# Patient Record
Sex: Female | Born: 1980 | Race: White | Hispanic: No
Health system: Midwestern US, Academic
[De-identification: ages and names within clinical notes are randomized; demographics above are authoritative.]

---

## 2017-12-21 ENCOUNTER — Encounter: Admit: 2017-12-21 | Discharge: 2017-12-21 | Payer: Private Health Insurance - Indemnity

## 2018-03-17 ENCOUNTER — Encounter: Admit: 2018-03-17 | Discharge: 2018-03-18 | Payer: Private Health Insurance - Indemnity

## 2018-03-17 ENCOUNTER — Encounter: Admit: 2018-03-17 | Discharge: 2018-03-17 | Payer: Private Health Insurance - Indemnity

## 2018-03-17 DIAGNOSIS — E1042 Type 1 diabetes mellitus with diabetic polyneuropathy: Secondary | ICD-10-CM

## 2018-03-17 DIAGNOSIS — I619 Nontraumatic intracerebral hemorrhage, unspecified: ICD-10-CM

## 2018-03-17 DIAGNOSIS — I10 Essential (primary) hypertension: ICD-10-CM

## 2018-03-17 DIAGNOSIS — E119 Type 2 diabetes mellitus without complications: Principal | ICD-10-CM

## 2018-03-17 DIAGNOSIS — R109 Unspecified abdominal pain: ICD-10-CM

## 2018-03-17 MED ORDER — SODIUM CHLORIDE 0.9 % IV SOLP
INTRAVENOUS | 0 refills | Status: DC
Start: 2018-03-17 — End: 2018-03-18
  Administered 2018-03-18 (×2): 1000.000 mL via INTRAVENOUS

## 2018-03-17 MED ORDER — NICARDIPINE IN NACL (ISO-OS) 20 MG/200 ML IV PGBK
5-15 mg/h | INTRAVENOUS | 0 refills | Status: DC
Start: 2018-03-17 — End: 2018-03-18

## 2018-03-17 MED ORDER — NICARDIPINE IN NACL (ISO-OS) 40 MG/200 ML IV PGBK
5-15 mg/h | INTRAVENOUS | 0 refills | Status: DC
Start: 2018-03-17 — End: 2018-03-18
  Administered 2018-03-18: 07:00:00 5 mg/h via INTRAVENOUS
  Administered 2018-03-18: 03:00:00 15 mg/h via INTRAVENOUS

## 2018-03-17 MED ORDER — DOCUSATE SODIUM 100 MG PO CAP
100 mg | Freq: Two times a day (BID) | ORAL | 0 refills | Status: DC
Start: 2018-03-17 — End: 2018-04-02
  Administered 2018-03-18 – 2018-04-02 (×25): 100 mg via ORAL

## 2018-03-17 MED ORDER — MIDAZOLAM 1 MG/ML IJ SOLN
1-2 mg | Freq: Once | INTRAVENOUS | 0 refills | Status: CP
Start: 2018-03-17 — End: ?
  Administered 2018-03-18: 03:00:00 2 mg via INTRAVENOUS

## 2018-03-17 MED ORDER — FENTANYL CITRATE (PF) 50 MCG/ML IJ SOLN
25-50 ug | INTRAVENOUS | 0 refills | Status: DC | PRN
Start: 2018-03-17 — End: 2018-03-27
  Administered 2018-03-18 (×2): 25 ug via INTRAVENOUS
  Administered 2018-03-18 – 2018-03-22 (×4): 50 ug via INTRAVENOUS

## 2018-03-17 MED ORDER — MAGNESIUM HYDROXIDE 2,400 MG/10 ML PO SUSP
10 mL | Freq: Every day | ORAL | 0 refills | Status: DC
Start: 2018-03-17 — End: 2018-04-06
  Administered 2018-03-18 – 2018-04-06 (×14): 10 mL via ORAL

## 2018-03-17 MED ORDER — LIDOCAINE HCL 10 MG/ML (1 %) IJ SOLN
25 mL | Freq: Once | INTRAMUSCULAR | 0 refills | Status: CP
Start: 2018-03-17 — End: ?
  Administered 2018-03-18: 05:00:00 25 mL via INTRAMUSCULAR

## 2018-03-17 MED ORDER — LABETALOL 5 MG/ML IV SYRG
10-20 mg | INTRAVENOUS | 0 refills | Status: DC | PRN
Start: 2018-03-17 — End: 2018-03-19
  Administered 2018-03-18 – 2018-03-19 (×3): 10 mg via INTRAVENOUS
  Administered 2018-03-19 (×4): 20 mg via INTRAVENOUS
  Administered 2018-03-19: 11:00:00 10 mg via INTRAVENOUS

## 2018-03-17 MED ORDER — CEFAZOLIN INJ 1GM IVP
1 g | INTRAVENOUS | 0 refills | Status: CP
Start: 2018-03-17 — End: ?
  Administered 2018-03-18 (×2): 1 g via INTRAVENOUS

## 2018-03-17 MED ORDER — HYDRALAZINE 20 MG/ML IJ SOLN
10 mg | INTRAVENOUS | 0 refills | Status: DC | PRN
Start: 2018-03-17 — End: 2018-03-19
  Administered 2018-03-18 – 2018-03-19 (×3): 10 mg via INTRAVENOUS

## 2018-03-17 MED ORDER — SENNOSIDES-DOCUSATE SODIUM 8.6-50 MG PO TAB
1 | Freq: Two times a day (BID) | ORAL | 0 refills | Status: DC
Start: 2018-03-17 — End: 2018-04-02
  Administered 2018-03-18 – 2018-04-02 (×26): 1 via ORAL

## 2018-03-17 MED ORDER — CEFAZOLIN INJ 1GM IVP
1 g | INTRAVENOUS | 0 refills | Status: DC
Start: 2018-03-17 — End: 2018-03-18
  Administered 2018-03-18: 03:00:00 1 g via INTRAVENOUS

## 2018-03-18 ENCOUNTER — Encounter: Admit: 2018-03-18 | Discharge: 2018-03-18 | Payer: Private Health Insurance - Indemnity

## 2018-03-18 ENCOUNTER — Inpatient Hospital Stay
Admit: 2018-03-18 | Discharge: 2018-04-06 | Disposition: A | Discharge status: Rehabilitation Facility | Payer: Private Health Insurance - Indemnity | Source: Other Acute Inpatient Hospital

## 2018-03-18 ENCOUNTER — Inpatient Hospital Stay: Admit: 2018-03-18 | Discharge: 2018-03-18 | Payer: Private Health Insurance - Indemnity

## 2018-03-18 LAB — POC GLUCOSE
Lab: 107 mg/dL — ABNORMAL HIGH (ref 70–100)
Lab: 121 mg/dL — ABNORMAL HIGH (ref 70–100)
Lab: 128 mg/dL — ABNORMAL HIGH (ref 70–100)
Lab: 129 mg/dL — ABNORMAL HIGH (ref 70–100)
Lab: 133 mg/dL — ABNORMAL HIGH (ref 70–100)
Lab: 135 mg/dL — ABNORMAL HIGH (ref 70–100)
Lab: 168 mg/dL — ABNORMAL HIGH (ref 70–100)
Lab: 178 mg/dL — ABNORMAL HIGH (ref 70–100)
Lab: 185 mg/dL — ABNORMAL HIGH (ref 70–100)
Lab: 193 mg/dL — ABNORMAL HIGH (ref 70–100)
Lab: 231 mg/dL — ABNORMAL HIGH (ref 70–100)
Lab: 289 mg/dL — ABNORMAL HIGH (ref >60)
Lab: 301 mg/dL — ABNORMAL HIGH (ref 70–100)
Lab: 356 mg/dL — ABNORMAL HIGH (ref 70–100)
Lab: 364 mg/dL — ABNORMAL HIGH (ref 70–100)
Lab: 71 mg/dL (ref 70–100)
Lab: 78 mg/dL (ref 70–100)
Lab: 82 mg/dL (ref 70–100)

## 2018-03-18 LAB — BASIC METABOLIC PANEL
Lab: 135 MMOL/L — ABNORMAL LOW (ref 137–147)
Lab: 5.8 MMOL/L — ABNORMAL HIGH (ref 3.5–5.1)
Lab: 135 MMOL/L — ABNORMAL LOW (ref 137–147)
Lab: 137 MMOL/L (ref 137–147)
Lab: 19 MMOL/L — ABNORMAL LOW (ref 21–30)
Lab: 19 mL/min — ABNORMAL LOW (ref >60)
Lab: 2.7 mg/dL — ABNORMAL HIGH (ref 0.4–1.00)
Lab: 289 mg/dL — ABNORMAL HIGH (ref 70–100)
Lab: 3.6 MMOL/L (ref 3.5–5.1)
Lab: 35 mg/dL — ABNORMAL HIGH (ref 7–25)
Lab: 8.5 mg/dL (ref 8.5–10.6)
Lab: 9 (ref 3–12)
Lab: 139 MMOL/L (ref 137–147)
Lab: 3.9 MMOL/L — ABNORMAL HIGH (ref 3.5–5.1)

## 2018-03-18 LAB — CBC AND DIFF
Lab: 0.1 10*3/uL (ref 0–0.20)
Lab: 0.2 10*3/uL (ref 0–0.80)
Lab: 0.9 10*3/uL — ABNORMAL LOW (ref 1.0–4.8)
Lab: 1 % (ref 0–2)
Lab: 10 10*3/uL (ref 4.5–11.0)
Lab: 4.4 M/UL (ref 4.0–5.0)
Lab: 19 K/UL — ABNORMAL HIGH (ref >40)

## 2018-03-18 LAB — SODIUM-URINE RANDOM: Lab: 21 MMOL/L

## 2018-03-18 LAB — LIPID PROFILE
Lab: 125 mg/dL — ABNORMAL HIGH (ref <100)
Lab: 147 mg/dL
Lab: 20 mg/dL
Lab: 221 mg/dL — ABNORMAL HIGH (ref <200)
Lab: 74 mg/dL (ref >40)
Lab: 98 mg/dL (ref <150)

## 2018-03-18 LAB — HEMOGLOBIN A1C: Lab: 8.6 % — ABNORMAL HIGH (ref 4.0–6.0)

## 2018-03-18 LAB — PHOSPHORUS
Lab: 2.8 mg/dL (ref 2.0–4.5)
Lab: 3.8 mg/dL — ABNORMAL LOW (ref >60)

## 2018-03-18 LAB — MAGNESIUM
Lab: 2.1 mg/dL (ref 1.6–2.6)
Lab: 1.9 mg/dL — ABNORMAL LOW (ref 1.6–2.6)

## 2018-03-18 LAB — PHENCYCLIDINES-URINE RANDOM: Lab: NEGATIVE

## 2018-03-18 LAB — BENZODIAZEPINES-URINE RANDOM: Lab: NEGATIVE

## 2018-03-18 LAB — OPIATES-URINE RANDOM: Lab: NEGATIVE

## 2018-03-18 LAB — COCAINE-URINE RANDOM: Lab: NEGATIVE

## 2018-03-18 LAB — URINALYSIS MICROSCOPIC REFLEX TO CULTURE

## 2018-03-18 LAB — BLOOD GASES, ARTERIAL
Lab: 18 MMOL/L — ABNORMAL LOW (ref 21–28)
Lab: 22 mmHg — ABNORMAL LOW (ref 35–45)
Lab: 7 MMOL/L
Lab: 7.4 (ref 7.35–7.45)
Lab: 96 mmHg (ref 80–100)
Lab: 97 % (ref 95–99)

## 2018-03-18 LAB — PREGNANCY TEST-URINE: Lab: NEGATIVE

## 2018-03-18 LAB — PTT (APTT): Lab: 22 s — ABNORMAL LOW (ref 24.0–36.5)

## 2018-03-18 LAB — BARBITURATES-URINE RANDOM: Lab: NEGATIVE

## 2018-03-18 LAB — IONIZED CALCIUM
Lab: 0.9 MMOL/L — ABNORMAL LOW (ref 1.0–1.3)
Lab: 1.1 MMOL/L — ABNORMAL HIGH (ref >60)

## 2018-03-18 LAB — CREATININE-URINE RANDOM: Lab: 114 mg/dL

## 2018-03-18 LAB — PROTIME INR (PT): Lab: 0.9 % — ABNORMAL LOW (ref >60)

## 2018-03-18 LAB — CANNABINOIDS-URINE RANDOM: Lab: NEGATIVE

## 2018-03-18 LAB — AMPHETAMINES-URINE RANDOM: Lab: NEGATIVE

## 2018-03-18 LAB — URINALYSIS DIPSTICK REFLEX TO CULTURE

## 2018-03-18 MED ORDER — ELECTROLYTE-A IV SOLP
500 mL | INTRAVENOUS | 0 refills | Status: DC
Start: 2018-03-18 — End: 2018-03-20
  Administered 2018-03-19 – 2018-03-20 (×5): 500 mL via INTRAVENOUS

## 2018-03-18 MED ORDER — ONDANSETRON HCL (PF) 4 MG/2 ML IJ SOLN
4 mg | INTRAVENOUS | 0 refills | Status: DC | PRN
Start: 2018-03-18 — End: 2018-04-06
  Administered 2018-03-18 – 2018-03-31 (×6): 4 mg via INTRAVENOUS

## 2018-03-18 MED ORDER — INSULIN ASPART 100 UNIT/ML SC FLEXPEN
0-24 [IU] | Freq: Every day | SUBCUTANEOUS | 0 refills | Status: DC
Start: 2018-03-18 — End: 2018-03-18

## 2018-03-18 MED ORDER — INSULIN 100UNITS NS 100ML
1-32 [IU]/h | INTRAVENOUS | 0 refills | Status: DC
Start: 2018-03-18 — End: 2018-03-19
  Administered 2018-03-18 (×2): 3 [IU]/h via INTRAVENOUS

## 2018-03-18 MED ORDER — INSULIN GLARGINE 100 UNIT/ML (3 ML) SC INJ PEN
20 [IU] | Freq: Every day | SUBCUTANEOUS | 0 refills | Status: DC
Start: 2018-03-18 — End: 2018-03-19
  Administered 2018-03-18: 20:00:00 20 [IU] via SUBCUTANEOUS

## 2018-03-18 MED ORDER — BACLOFEN 10 MG PO TAB
10 mg | Freq: Three times a day (TID) | ORAL | 0 refills | Status: DC
Start: 2018-03-18 — End: 2018-03-18
  Administered 2018-03-18: 15:00:00 10 mg via ORAL

## 2018-03-18 MED ORDER — METOPROLOL TARTRATE 5 MG/5 ML IV SOLN
2.5 mg | Freq: Once | INTRAVENOUS | 0 refills | Status: CP
Start: 2018-03-18 — End: ?
  Administered 2018-03-18: 15:00:00 2.5 mg via INTRAVENOUS

## 2018-03-18 MED ORDER — ACETAMINOPHEN 160 MG/5 ML PO SOLN
650 mg | ORAL | 0 refills | Status: DC | PRN
Start: 2018-03-18 — End: 2018-03-19
  Administered 2018-03-18 – 2018-03-19 (×2): 650 mg via ORAL

## 2018-03-18 MED ORDER — INSULIN ASPART 100 UNIT/ML SC FLEXPEN
0-12 [IU] | Freq: Every day | SUBCUTANEOUS | 0 refills | Status: DC
Start: 2018-03-18 — End: 2018-03-19

## 2018-03-18 MED ORDER — SODIUM POLYSTYRENE SULFONATE 30 GRAM/120 ML RE ENEM
30 g | Freq: Once | RECTAL | 0 refills | Status: DC
Start: 2018-03-18 — End: 2018-03-18

## 2018-03-18 MED ORDER — GABAPENTIN 300 MG PO CAP
300 mg | ORAL | 0 refills | Status: DC
Start: 2018-03-18 — End: 2018-03-18
  Administered 2018-03-18: 15:00:00 300 mg via ORAL

## 2018-03-19 ENCOUNTER — Encounter: Admit: 2018-03-19 | Discharge: 2018-03-19 | Payer: Private Health Insurance - Indemnity

## 2018-03-19 LAB — POC GLUCOSE
Lab: 182 mg/dL — ABNORMAL HIGH (ref 70–100)
Lab: 139 mg/dL — ABNORMAL HIGH (ref 70–100)
Lab: 93 mg/dL (ref 70–100)
Lab: 125 mg/dL — ABNORMAL HIGH (ref 70–100)
Lab: 115 mg/dL — ABNORMAL HIGH (ref 70–100)
Lab: 146 mg/dL — ABNORMAL HIGH (ref 70–100)
Lab: 191 mg/dL — ABNORMAL HIGH (ref 70–100)
Lab: 209 mg/dL — ABNORMAL HIGH (ref 70–100)
Lab: 277 mg/dL — ABNORMAL HIGH (ref 70–100)

## 2018-03-19 LAB — IONIZED CALCIUM: Lab: 1.1 MMOL/L — ABNORMAL LOW (ref 1.0–1.3)

## 2018-03-19 LAB — CBC AND DIFF: Lab: 12 K/UL — ABNORMAL HIGH (ref 4.5–11.0)

## 2018-03-19 LAB — CULTURE-URINE W/SENSITIVITY

## 2018-03-19 LAB — BASIC METABOLIC PANEL: Lab: 140 MMOL/L — ABNORMAL LOW (ref >60)

## 2018-03-19 LAB — PHOSPHORUS: Lab: 4.8 mg/dL — ABNORMAL HIGH (ref 2.0–4.5)

## 2018-03-19 LAB — MAGNESIUM: Lab: 2.2 mg/dL — ABNORMAL HIGH (ref 1.6–2.6)

## 2018-03-19 MED ORDER — INSULIN ASPART 100 UNIT/ML SC FLEXPEN
1-25 [IU] | Freq: Three times a day (TID) | SUBCUTANEOUS | 0 refills | Status: DC
Start: 2018-03-19 — End: 2018-03-21

## 2018-03-19 MED ORDER — LABETALOL 5 MG/ML IV SYRG
10-20 mg | INTRAVENOUS | 0 refills | Status: DC | PRN
Start: 2018-03-19 — End: 2018-03-25
  Administered 2018-03-19 – 2018-03-20 (×7): 20 mg via INTRAVENOUS
  Administered 2018-03-21: 09:00:00 10 mg via INTRAVENOUS
  Administered 2018-03-21: 23:00:00 20 mg via INTRAVENOUS
  Administered 2018-03-21 (×3): 10 mg via INTRAVENOUS
  Administered 2018-03-21: 03:00:00 20 mg via INTRAVENOUS
  Administered 2018-03-21 (×2): 10 mg via INTRAVENOUS
  Administered 2018-03-22: 11:00:00 20 mg via INTRAVENOUS
  Administered 2018-03-22: 05:00:00 10 mg via INTRAVENOUS
  Administered 2018-03-22 (×13): 20 mg via INTRAVENOUS
  Administered 2018-03-22: 05:00:00 10 mg via INTRAVENOUS
  Administered 2018-03-22 – 2018-03-23 (×24): 20 mg via INTRAVENOUS
  Administered 2018-03-24 – 2018-03-25 (×4): 10 mg via INTRAVENOUS

## 2018-03-19 MED ORDER — HYDRALAZINE 20 MG/ML IJ SOLN
10-20 mg | INTRAVENOUS | 0 refills | Status: DC | PRN
Start: 2018-03-19 — End: 2018-03-19
  Administered 2018-03-19: 18:00:00 10 mg via INTRAVENOUS

## 2018-03-19 MED ORDER — GABAPENTIN 250 MG/5 ML PO SOLN
300 mg | ORAL | 0 refills | Status: DC
Start: 2018-03-19 — End: 2018-03-20
  Administered 2018-03-19 – 2018-03-20 (×4): 300 mg via ORAL

## 2018-03-19 MED ORDER — HYDRALAZINE 20 MG/ML IJ SOLN
10-20 mg | INTRAVENOUS | 0 refills | Status: DC | PRN
Start: 2018-03-19 — End: 2018-04-01
  Administered 2018-03-19: 22:00:00 10 mg via INTRAVENOUS
  Administered 2018-03-20 (×2): 20 mg via INTRAVENOUS
  Administered 2018-03-21 (×2): 10 mg via INTRAVENOUS
  Administered 2018-03-21 (×2): 20 mg via INTRAVENOUS
  Administered 2018-03-21 – 2018-03-22 (×3): 10 mg via INTRAVENOUS
  Administered 2018-03-22: 22:00:00 20 mg via INTRAVENOUS
  Administered 2018-03-22: 08:00:00 10 mg via INTRAVENOUS
  Administered 2018-03-22: 15:00:00 20 mg via INTRAVENOUS
  Administered 2018-03-22: 04:00:00 10 mg via INTRAVENOUS
  Administered 2018-03-23: 06:00:00 20 mg via INTRAVENOUS
  Administered 2018-03-24: 02:00:00 10 mg via INTRAVENOUS
  Administered 2018-03-25: 03:00:00 20 mg via INTRAVENOUS
  Administered 2018-03-28: 08:00:00 15 mg via INTRAVENOUS
  Administered 2018-03-31 (×2): 10 mg via INTRAVENOUS

## 2018-03-19 MED ORDER — INSULIN GLARGINE 100 UNIT/ML (3 ML) SC INJ PEN
18 [IU] | Freq: Every day | SUBCUTANEOUS | 0 refills | Status: DC
Start: 2018-03-19 — End: 2018-03-20

## 2018-03-19 MED ORDER — ACETAMINOPHEN 325 MG PO TAB
650 mg | ORAL | 0 refills | Status: DC | PRN
Start: 2018-03-19 — End: 2018-04-04
  Administered 2018-03-19 – 2018-04-03 (×21): 650 mg via ORAL

## 2018-03-19 MED ORDER — AMLODIPINE 5 MG PO TAB
5 mg | Freq: Every day | ORAL | 0 refills | Status: DC
Start: 2018-03-19 — End: 2018-03-20
  Administered 2018-03-19: 13:00:00 5 mg via ORAL

## 2018-03-19 MED ORDER — INSULIN ASPART 100 UNIT/ML SC FLEXPEN
0-6 [IU] | Freq: Every day | SUBCUTANEOUS | 0 refills | Status: DC
Start: 2018-03-19 — End: 2018-03-21

## 2018-03-20 LAB — POC GLUCOSE
Lab: 273 mg/dL — ABNORMAL HIGH (ref 70–100)
Lab: 265 mg/dL — ABNORMAL HIGH (ref 70–100)
Lab: 233 mg/dL — ABNORMAL HIGH (ref 70–100)
Lab: 328 mg/dL — ABNORMAL HIGH (ref 70–100)
Lab: 307 mg/dL — ABNORMAL HIGH (ref 70–100)

## 2018-03-20 LAB — PHOSPHORUS: Lab: 4.3 mg/dL — ABNORMAL HIGH (ref >60)

## 2018-03-20 LAB — MAGNESIUM: Lab: 2.6 mg/dL — ABNORMAL HIGH (ref 1.6–2.6)

## 2018-03-20 LAB — IONIZED CALCIUM: Lab: 1.1 MMOL/L — ABNORMAL HIGH (ref >60)

## 2018-03-20 LAB — BASIC METABOLIC PANEL: Lab: 142 MMOL/L — ABNORMAL LOW (ref 137–147)

## 2018-03-20 LAB — CBC AND DIFF: Lab: 9.8 K/UL — ABNORMAL LOW (ref 4.5–11.0)

## 2018-03-20 MED ORDER — INSULIN GLARGINE 100 UNIT/ML (3 ML) SC INJ PEN
20 [IU] | Freq: Every day | SUBCUTANEOUS | 0 refills | Status: DC
Start: 2018-03-20 — End: 2018-03-21

## 2018-03-20 MED ORDER — BACLOFEN 10 MG PO TAB
5 mg | Freq: Three times a day (TID) | ORAL | 0 refills | Status: DC
Start: 2018-03-20 — End: 2018-03-21
  Administered 2018-03-21: 13:00:00 5 mg via ORAL

## 2018-03-20 MED ORDER — METOPROLOL TARTRATE 25 MG PO TAB
12.5 mg | Freq: Two times a day (BID) | ORAL | 0 refills | Status: DC
Start: 2018-03-20 — End: 2018-03-21
  Administered 2018-03-20 – 2018-03-21 (×3): 12.5 mg via ORAL

## 2018-03-20 MED ORDER — AMLODIPINE 10 MG PO TAB
10 mg | Freq: Every day | ORAL | 0 refills | Status: DC
Start: 2018-03-20 — End: 2018-04-06
  Administered 2018-03-20 – 2018-04-06 (×18): 10 mg via ORAL

## 2018-03-20 MED ORDER — BACLOFEN(#) 10MG/ML PO SUSP
5 mg | Freq: Three times a day (TID) | ORAL | 0 refills | Status: DC
Start: 2018-03-20 — End: 2018-03-20

## 2018-03-20 MED ORDER — HEPARIN, PORCINE (PF) 5,000 UNIT/0.5 ML IJ SYRG
5000 [IU] | SUBCUTANEOUS | 0 refills | Status: CP
Start: 2018-03-20 — End: ?
  Administered 2018-03-20 – 2018-03-23 (×8): 5000 [IU] via SUBCUTANEOUS

## 2018-03-21 LAB — POC GLUCOSE
Lab: 282 mg/dL — ABNORMAL HIGH (ref 70–100)
Lab: 197 mg/dL — ABNORMAL HIGH (ref 70–100)
Lab: 209 mg/dL — ABNORMAL HIGH (ref 70–100)
Lab: 183 mg/dL — ABNORMAL HIGH (ref 70–100)
Lab: 390 mg/dL — ABNORMAL HIGH (ref 70–100)
Lab: 264 mg/dL — ABNORMAL HIGH (ref 70–100)
Lab: 266 mg/dL — ABNORMAL HIGH (ref 70–100)
Lab: 261 mg/dL — ABNORMAL HIGH (ref 70–100)

## 2018-03-21 LAB — CBC AND DIFF: Lab: 8.4 K/UL — ABNORMAL LOW (ref 4.5–11.0)

## 2018-03-21 LAB — BASIC METABOLIC PANEL: Lab: 138 MMOL/L — ABNORMAL LOW (ref 137–147)

## 2018-03-21 LAB — MAGNESIUM: Lab: 2.4 mg/dL — ABNORMAL LOW (ref 1.6–2.6)

## 2018-03-21 LAB — PHOSPHORUS: Lab: 4.4 mg/dL — ABNORMAL LOW (ref 2.0–4.5)

## 2018-03-21 LAB — IONIZED CALCIUM: Lab: 1.1 MMOL/L — ABNORMAL HIGH (ref 1.0–1.3)

## 2018-03-21 MED ORDER — ALBUMIN, HUMAN 25 % IV SOLP
25 g | Freq: Two times a day (BID) | INTRAVENOUS | 0 refills | Status: CP
Start: 2018-03-21 — End: ?
  Administered 2018-03-21 – 2018-03-23 (×4): 25 g via INTRAVENOUS

## 2018-03-21 MED ORDER — LIDOCAINE 5 % TP PTMD
1 | Freq: Every day | TOPICAL | 0 refills | Status: DC
Start: 2018-03-21 — End: 2018-04-06
  Administered 2018-03-21 – 2018-04-04 (×12): 1 via TOPICAL

## 2018-03-21 MED ORDER — INSULIN ASPART 100 UNIT/ML SC FLEXPEN
1-25 [IU] | Freq: Three times a day (TID) | SUBCUTANEOUS | 0 refills | Status: DC
Start: 2018-03-21 — End: 2018-03-22

## 2018-03-21 MED ORDER — ALBUMIN, HUMAN 5 % IV SOLP
250 mL | Freq: Once | INTRAVENOUS | 0 refills | Status: CP
Start: 2018-03-21 — End: ?
  Administered 2018-03-21: 16:00:00 250 mL via INTRAVENOUS

## 2018-03-21 MED ORDER — METHOCARBAMOL 750 MG PO TAB
750 mg | Freq: Two times a day (BID) | ORAL | 0 refills | Status: DC | PRN
Start: 2018-03-21 — End: 2018-04-02
  Administered 2018-03-21 – 2018-03-28 (×4): 750 mg via ORAL

## 2018-03-21 MED ORDER — GABAPENTIN 300 MG PO CAP
300 mg | ORAL | 0 refills | Status: DC | PRN
Start: 2018-03-21 — End: 2018-04-02
  Administered 2018-03-21 – 2018-03-23 (×3): 300 mg via ORAL

## 2018-03-21 MED ORDER — INSULIN ASPART 100 UNIT/ML SC FLEXPEN
0-12 [IU] | Freq: Before meals | SUBCUTANEOUS | 0 refills | Status: DC
Start: 2018-03-21 — End: 2018-03-22

## 2018-03-21 MED ORDER — INSULIN GLARGINE 100 UNIT/ML (3 ML) SC INJ PEN
24 [IU] | Freq: Every day | SUBCUTANEOUS | 0 refills | Status: DC
Start: 2018-03-21 — End: 2018-03-22

## 2018-03-21 MED ORDER — CARVEDILOL 6.25 MG PO TAB
6.25 mg | Freq: Two times a day (BID) | ORAL | 0 refills | Status: DC
Start: 2018-03-21 — End: 2018-03-22
  Administered 2018-03-21 – 2018-03-22 (×2): 6.25 mg via ORAL

## 2018-03-21 MED ORDER — PATCH DOCUMENTATION - LIDOCAINE 5%
Freq: Two times a day (BID) | TRANSDERMAL | 0 refills | Status: DC
Start: 2018-03-21 — End: 2018-04-06

## 2018-03-21 MED ORDER — OXYCODONE 5 MG PO TAB
5 mg | Freq: Once | ORAL | 0 refills | Status: CP
Start: 2018-03-21 — End: ?
  Administered 2018-03-22: 02:00:00 5 mg via ORAL

## 2018-03-22 ENCOUNTER — Encounter: Admit: 2018-03-22 | Discharge: 2018-03-22 | Payer: Private Health Insurance - Indemnity

## 2018-03-22 LAB — CBC AND DIFF: Lab: 9.2 K/UL — ABNORMAL LOW (ref 4.5–11.0)

## 2018-03-22 LAB — POC GLUCOSE
Lab: 323 mg/dL — ABNORMAL HIGH (ref 70–100)
Lab: 518 mg/dL — ABNORMAL HIGH (ref 70–100)
Lab: 395 mg/dL — ABNORMAL HIGH (ref 70–100)
Lab: 368 mg/dL — ABNORMAL HIGH (ref 70–100)
Lab: 301 mg/dL — ABNORMAL HIGH (ref 70–100)
Lab: 292 mg/dL — ABNORMAL HIGH (ref 70–100)
Lab: 276 mg/dL — ABNORMAL HIGH (ref 70–100)
Lab: 247 mg/dL — ABNORMAL HIGH (ref 70–100)
Lab: 209 mg/dL — ABNORMAL HIGH (ref 70–100)
Lab: 181 mg/dL — ABNORMAL HIGH (ref 70–100)
Lab: 167 mg/dL — ABNORMAL HIGH (ref 70–100)
Lab: 144 mg/dL — ABNORMAL HIGH (ref 70–100)
Lab: 196 mg/dL — ABNORMAL HIGH (ref 70–100)

## 2018-03-22 LAB — IONIZED CALCIUM: Lab: 1.1 MMOL/L — CL (ref >60)

## 2018-03-22 LAB — MAGNESIUM: Lab: 2.4 mg/dL — ABNORMAL LOW (ref 1.6–2.6)

## 2018-03-22 LAB — BASIC METABOLIC PANEL: Lab: 134 MMOL/L — ABNORMAL LOW (ref 137–147)

## 2018-03-22 LAB — PHOSPHORUS: Lab: 4.7 mg/dL — ABNORMAL HIGH (ref >60)

## 2018-03-22 MED ORDER — INSULIN 100UNITS NS 100ML
1-32 [IU]/h | INTRAVENOUS | 0 refills | Status: AC
Start: 2018-03-22 — End: ?
  Administered 2018-03-22 (×2): 4 [IU]/h via INTRAVENOUS
  Administered 2018-03-23 (×2): 2 [IU]/h via INTRAVENOUS

## 2018-03-22 MED ORDER — INSULIN ASPART 100 UNIT/ML SC FLEXPEN
1-25 [IU] | Freq: Three times a day (TID) | SUBCUTANEOUS | 0 refills | Status: DC
Start: 2018-03-22 — End: 2018-03-31

## 2018-03-22 MED ORDER — HYDRALAZINE 20 MG/ML IJ SOLN
10 mg | INTRAVENOUS | 0 refills | Status: DC
Start: 2018-03-22 — End: 2018-03-23
  Administered 2018-03-22 – 2018-03-23 (×3): 10 mg via INTRAVENOUS

## 2018-03-22 MED ORDER — ELECTROLYTE-A IV SOLP
500 mL | Freq: Once | INTRAVENOUS | 0 refills | Status: CP
Start: 2018-03-22 — End: ?
  Administered 2018-03-22: 17:00:00 500 mL via INTRAVENOUS

## 2018-03-22 MED ORDER — CARVEDILOL 12.5 MG PO TAB
12.5 mg | Freq: Two times a day (BID) | ORAL | 0 refills | Status: DC
Start: 2018-03-22 — End: 2018-03-23
  Administered 2018-03-22 – 2018-03-23 (×3): 12.5 mg via ORAL

## 2018-03-23 LAB — POC GLUCOSE
Lab: 100 mg/dL (ref 70–100)
Lab: 101 mg/dL — ABNORMAL HIGH (ref 70–100)
Lab: 105 mg/dL — ABNORMAL HIGH (ref 70–100)
Lab: 111 mg/dL — ABNORMAL HIGH (ref 70–100)
Lab: 111 mg/dL — ABNORMAL HIGH (ref 70–100)
Lab: 113 mg/dL — ABNORMAL HIGH (ref 70–100)
Lab: 114 mg/dL — ABNORMAL HIGH (ref 70–100)
Lab: 121 mg/dL — ABNORMAL HIGH (ref 70–100)
Lab: 126 mg/dL — ABNORMAL HIGH (ref 70–100)
Lab: 128 mg/dL — ABNORMAL HIGH (ref 70–100)
Lab: 135 mg/dL — ABNORMAL HIGH (ref 70–100)
Lab: 140 mg/dL — ABNORMAL HIGH (ref 70–100)
Lab: 149 mg/dL — ABNORMAL HIGH (ref 70–100)
Lab: 155 mg/dL — ABNORMAL HIGH (ref 70–100)
Lab: 160 mg/dL — ABNORMAL HIGH (ref 70–100)
Lab: 164 mg/dL — ABNORMAL HIGH (ref 70–100)
Lab: 168 mg/dL — ABNORMAL HIGH (ref 70–100)
Lab: 177 mg/dL — ABNORMAL HIGH (ref 70–100)
Lab: 209 mg/dL — ABNORMAL HIGH (ref 70–100)
Lab: 228 mg/dL — ABNORMAL HIGH (ref 70–100)
Lab: 343 mg/dL — ABNORMAL HIGH (ref 70–100)
Lab: 357 mg/dL — ABNORMAL HIGH (ref 70–100)
Lab: 83 mg/dL (ref 70–100)
Lab: 94 mg/dL (ref 70–100)

## 2018-03-23 LAB — PHOSPHORUS: Lab: 5.2 mg/dL — ABNORMAL HIGH (ref 2.0–4.5)

## 2018-03-23 LAB — CBC
Lab: 13 % (ref 11–15)
Lab: 241 K/UL — ABNORMAL LOW (ref >60)
Lab: 28 pg — ABNORMAL HIGH (ref 26–34)
Lab: 32 g/dL — ABNORMAL HIGH (ref 32.0–36.0)
Lab: 8.2 10*3/uL (ref 4.5–11.0)
Lab: 86 FL — ABNORMAL HIGH (ref 80–100)

## 2018-03-23 LAB — URINALYSIS, MICROSCOPIC

## 2018-03-23 LAB — URINALYSIS DIPSTICK

## 2018-03-23 LAB — IONIZED CALCIUM: Lab: 1.1 MMOL/L — ABNORMAL LOW (ref 1.0–1.3)

## 2018-03-23 LAB — MAGNESIUM: Lab: 2.6 mg/dL — ABNORMAL LOW (ref 1.6–2.6)

## 2018-03-23 LAB — BASIC METABOLIC PANEL: Lab: 138 MMOL/L (ref 137–147)

## 2018-03-23 MED ORDER — CARVEDILOL 25 MG PO TAB
25 mg | Freq: Two times a day (BID) | ORAL | 0 refills | Status: DC
Start: 2018-03-23 — End: 2018-04-06
  Administered 2018-03-24 – 2018-04-06 (×27): 25 mg via ORAL

## 2018-03-23 MED ORDER — HYDRALAZINE 20 MG/ML IJ SOLN
10 mg | INTRAVENOUS | 0 refills | Status: DC
Start: 2018-03-23 — End: 2018-03-26
  Administered 2018-03-23 – 2018-03-26 (×10): 10 mg via INTRAVENOUS

## 2018-03-23 MED ORDER — OXYCODONE 5 MG PO TAB
5 mg | Freq: Once | ORAL | 0 refills | Status: CP
Start: 2018-03-23 — End: ?
  Administered 2018-03-23: 15:00:00 5 mg via ORAL

## 2018-03-23 MED ORDER — HYDRALAZINE 20 MG/ML IJ SOLN
20 mg | INTRAVENOUS | 0 refills | Status: DC
Start: 2018-03-23 — End: 2018-03-23

## 2018-03-23 MED ORDER — INSULIN ASPART 100 UNIT/ML SC FLEXPEN
0-12 [IU] | Freq: Every day | SUBCUTANEOUS | 0 refills | Status: DC
Start: 2018-03-23 — End: 2018-03-26

## 2018-03-23 MED ORDER — INSULIN GLARGINE 100 UNIT/ML (3 ML) SC INJ PEN
28 [IU] | Freq: Every day | SUBCUTANEOUS | 0 refills | Status: DC
Start: 2018-03-23 — End: 2018-03-24

## 2018-03-23 MED ORDER — INSULIN ASPART 100 UNIT/ML SC FLEXPEN
0-12 [IU] | Freq: Before meals | SUBCUTANEOUS | 0 refills | Status: DC
Start: 2018-03-23 — End: 2018-03-24

## 2018-03-23 MED ORDER — INSULIN GLARGINE 100 UNIT/ML (3 ML) SC INJ PEN
24 [IU] | Freq: Every day | SUBCUTANEOUS | 0 refills | Status: DC
Start: 2018-03-23 — End: 2018-03-24

## 2018-03-23 MED ORDER — CARVEDILOL 12.5 MG PO TAB
12.5 mg | Freq: Once | ORAL | 0 refills | Status: CP
Start: 2018-03-23 — End: ?
  Administered 2018-03-23: 15:00:00 12.5 mg via ORAL

## 2018-03-24 LAB — POC GLUCOSE
Lab: 105 mg/dL — ABNORMAL HIGH (ref 70–100)
Lab: 196 mg/dL — ABNORMAL HIGH (ref 70–100)
Lab: 271 mg/dL — ABNORMAL HIGH (ref 70–100)
Lab: 296 mg/dL — ABNORMAL HIGH (ref 70–100)
Lab: 322 mg/dL — ABNORMAL HIGH (ref 70–100)
Lab: 323 mg/dL — ABNORMAL HIGH (ref 70–100)
Lab: 69 mg/dL — ABNORMAL LOW (ref 70–100)

## 2018-03-24 LAB — MAGNESIUM: Lab: 2.5 mg/dL — ABNORMAL LOW (ref 1.6–2.6)

## 2018-03-24 LAB — PHOSPHORUS: Lab: 4.5 mg/dL — ABNORMAL HIGH (ref >60)

## 2018-03-24 LAB — BASIC METABOLIC PANEL: Lab: 135 MMOL/L — ABNORMAL LOW (ref 137–147)

## 2018-03-24 LAB — CBC: Lab: 8.3 10*3/uL — ABNORMAL LOW (ref >60)

## 2018-03-24 LAB — PARATHYROID HORMONE: Lab: 76 pg/mL — ABNORMAL HIGH (ref 10–65)

## 2018-03-24 LAB — CULTURE-BLOOD W/SENSITIVITY

## 2018-03-24 LAB — IONIZED CALCIUM: Lab: 1.1 MMOL/L — ABNORMAL LOW (ref >60)

## 2018-03-24 MED ORDER — OLANZAPINE 5 MG PO TBDI
5 mg | Freq: Once | ORAL | 0 refills | Status: DC
Start: 2018-03-24 — End: 2018-03-24

## 2018-03-24 MED ORDER — HALOPERIDOL LACTATE 5 MG/ML IJ SOLN
2 mg | Freq: Once | INTRAVENOUS | 0 refills | Status: CP
Start: 2018-03-24 — End: ?
  Administered 2018-03-24: 06:00:00 2 mg via INTRAVENOUS

## 2018-03-24 MED ORDER — HEPARIN, PORCINE (PF) 5,000 UNIT/0.5 ML IJ SYRG
5000 [IU] | SUBCUTANEOUS | 0 refills | Status: DC
Start: 2018-03-24 — End: 2018-03-27
  Administered 2018-03-25 – 2018-03-27 (×6): 5000 [IU] via SUBCUTANEOUS

## 2018-03-24 MED ORDER — INSULIN GLARGINE 100 UNIT/ML (3 ML) SC INJ PEN
30 [IU] | Freq: Every day | SUBCUTANEOUS | 0 refills | Status: DC
Start: 2018-03-24 — End: 2018-03-26

## 2018-03-24 MED ORDER — QUETIAPINE 25 MG PO TAB
100 mg | Freq: Every evening | ORAL | 0 refills | Status: DC
Start: 2018-03-24 — End: 2018-03-27
  Administered 2018-03-25 – 2018-03-27 (×2): 100 mg via ORAL

## 2018-03-24 MED ORDER — CEFTRIAXONE INJ 1GM IVP
1 g | INTRAVENOUS | 0 refills | Status: CP
Start: 2018-03-24 — End: ?
  Administered 2018-03-24 – 2018-03-26 (×3): 1 g via INTRAVENOUS

## 2018-03-24 MED ORDER — OLANZAPINE 2.5 MG PO TAB
5 mg | Freq: Once | ORAL | 0 refills | Status: DC
Start: 2018-03-24 — End: 2018-03-24

## 2018-03-25 LAB — POC GLUCOSE
Lab: 119 mg/dL — ABNORMAL HIGH (ref 70–100)
Lab: 104 mg/dL — ABNORMAL HIGH (ref 70–100)
Lab: 235 mg/dL — ABNORMAL HIGH (ref 70–100)
Lab: 201 mg/dL — ABNORMAL HIGH (ref 70–100)
Lab: 45 mg/dL — CL (ref 70–100)
Lab: 111 mg/dL — ABNORMAL HIGH (ref 70–100)
Lab: 57 mg/dL — ABNORMAL LOW (ref 70–100)

## 2018-03-25 LAB — CBC: Lab: 10 K/UL — ABNORMAL HIGH (ref >60)

## 2018-03-25 LAB — PROTEIN/CR RATIO,UR RAN
Lab: 427 mg/dL
Lab: 5.3
Lab: 80 mg/dL

## 2018-03-25 LAB — IONIZED CALCIUM: Lab: 1.2 MMOL/L — ABNORMAL LOW (ref >60)

## 2018-03-25 LAB — PHOSPHORUS: Lab: 4.1 mg/dL — ABNORMAL LOW (ref 2.0–4.5)

## 2018-03-25 LAB — MAGNESIUM: Lab: 2.3 mg/dL — ABNORMAL LOW (ref 1.6–2.6)

## 2018-03-25 LAB — BASIC METABOLIC PANEL: Lab: 138 MMOL/L — ABNORMAL HIGH (ref >60)

## 2018-03-25 MED ORDER — LABETALOL 5 MG/ML IV SYRG
10-20 mg | INTRAVENOUS | 0 refills | Status: DC | PRN
Start: 2018-03-25 — End: 2018-03-31
  Administered 2018-03-25 (×3): 20 mg via INTRAVENOUS
  Administered 2018-03-31: 10:00:00 10 mg via INTRAVENOUS

## 2018-03-25 MED ORDER — INSULIN ASPART 100 UNIT/ML SC FLEXPEN
0-6 [IU] | Freq: Every day | SUBCUTANEOUS | 0 refills | Status: DC
Start: 2018-03-25 — End: 2018-03-26

## 2018-03-25 MED ORDER — NICARDIPINE IN NACL (ISO-OS) 20 MG/200 ML IV PGBK
5-15 mg/h | INTRAVENOUS | 0 refills | Status: DC
Start: 2018-03-25 — End: 2018-03-29
  Administered 2018-03-25 (×4): 5 mg/h via INTRAVENOUS
  Administered 2018-03-26: 15:00:00 10 mg/h via INTRAVENOUS
  Administered 2018-03-26: 04:00:00 5 mg/h via INTRAVENOUS
  Administered 2018-03-26: 12:00:00 10 mg/h via INTRAVENOUS
  Administered 2018-03-26: 08:00:00 5 mg/h via INTRAVENOUS

## 2018-03-25 MED ORDER — INSULIN ASPART 100 UNIT/ML SC FLEXPEN
0-6 [IU] | Freq: Before meals | SUBCUTANEOUS | 0 refills | Status: DC
Start: 2018-03-25 — End: 2018-03-26

## 2018-03-25 MED ORDER — CLONIDINE HCL 0.1 MG PO TAB
.1 mg | Freq: Three times a day (TID) | ORAL | 0 refills | Status: DC
Start: 2018-03-25 — End: 2018-03-26
  Administered 2018-03-25 – 2018-03-26 (×3): 0.1 mg via ORAL

## 2018-03-26 LAB — CBC
Lab: 11 10*3/uL — ABNORMAL HIGH (ref 4.5–11.0)
Lab: 28 pg — ABNORMAL HIGH (ref 26–34)
Lab: 30 % — ABNORMAL LOW (ref 36–45)
Lab: 32 g/dL — ABNORMAL HIGH (ref 32.0–36.0)
Lab: 87 FL — ABNORMAL HIGH (ref 80–100)

## 2018-03-26 LAB — POC GLUCOSE
Lab: 311 mg/dL — ABNORMAL HIGH (ref 70–100)
Lab: 406 mg/dL — ABNORMAL HIGH (ref 70–100)
Lab: 401 mg/dL — ABNORMAL HIGH (ref 70–100)
Lab: 337 mg/dL — ABNORMAL HIGH (ref 70–100)
Lab: 314 mg/dL — ABNORMAL HIGH (ref 70–100)
Lab: 421 mg/dL — ABNORMAL HIGH (ref 70–100)
Lab: 358 mg/dL — ABNORMAL HIGH (ref 70–100)
Lab: 309 mg/dL — ABNORMAL HIGH (ref 70–100)
Lab: 252 mg/dL — ABNORMAL HIGH (ref 70–100)

## 2018-03-26 LAB — MAGNESIUM: Lab: 2.3 mg/dL — ABNORMAL LOW (ref 1.6–2.6)

## 2018-03-26 LAB — PHOSPHORUS: Lab: 3.3 mg/dL — ABNORMAL LOW (ref 2.0–4.5)

## 2018-03-26 LAB — BASIC METABOLIC PANEL: Lab: 135 MMOL/L — ABNORMAL LOW (ref 137–147)

## 2018-03-26 MED ORDER — INSULIN ASPART 100 UNIT/ML SC FLEXPEN
0-12 [IU] | Freq: Every day | SUBCUTANEOUS | 0 refills | Status: DC
Start: 2018-03-26 — End: 2018-04-06
  Administered 2018-03-30: 01:00:00 6 [IU] via SUBCUTANEOUS

## 2018-03-26 MED ORDER — ACETAMINOPHEN 325 MG PO TAB
650 mg | Freq: Once | ORAL | 0 refills | Status: CP
Start: 2018-03-26 — End: ?
  Administered 2018-03-26: 15:00:00 650 mg via ORAL

## 2018-03-26 MED ORDER — INSULIN GLARGINE 100 UNIT/ML (3 ML) SC INJ PEN
32 [IU] | Freq: Every day | SUBCUTANEOUS | 0 refills | Status: DC
Start: 2018-03-26 — End: 2018-03-26

## 2018-03-26 MED ORDER — FUROSEMIDE 10 MG/ML IJ SOLN
40 mg | Freq: Once | INTRAVENOUS | 0 refills | Status: CP
Start: 2018-03-26 — End: ?
  Administered 2018-03-26: 21:00:00 40 mg via INTRAVENOUS

## 2018-03-26 MED ORDER — CLONIDINE HCL 0.2 MG PO TAB
.2 mg | Freq: Three times a day (TID) | ORAL | 0 refills | Status: DC
Start: 2018-03-26 — End: 2018-03-26

## 2018-03-26 MED ORDER — CLONIDINE HCL 0.1 MG PO TAB
.1 mg | Freq: Three times a day (TID) | ORAL | 0 refills | Status: DC
Start: 2018-03-26 — End: 2018-04-01
  Administered 2018-03-26 – 2018-04-01 (×17): 0.1 mg via ORAL

## 2018-03-26 MED ORDER — INSULIN GLARGINE 100 UNIT/ML (3 ML) SC INJ PEN
32 [IU] | Freq: Every day | SUBCUTANEOUS | 0 refills | Status: DC
Start: 2018-03-26 — End: 2018-03-28

## 2018-03-26 MED ORDER — HYDRALAZINE 50 MG PO TAB
50 mg | Freq: Three times a day (TID) | ORAL | 0 refills | Status: DC
Start: 2018-03-26 — End: 2018-03-31
  Administered 2018-03-26 – 2018-03-31 (×14): 50 mg via ORAL

## 2018-03-27 ENCOUNTER — Encounter: Admit: 2018-03-27 | Discharge: 2018-03-27 | Payer: Private Health Insurance - Indemnity

## 2018-03-27 LAB — COMPREHENSIVE METABOLIC PANEL
Lab: 0.2 mg/dL — ABNORMAL LOW (ref 0.3–1.2)
Lab: 104 MMOL/L — ABNORMAL LOW (ref 98–110)
Lab: 133 MMOL/L — ABNORMAL LOW (ref 137–147)
Lab: 144 mg/dL — ABNORMAL HIGH (ref 70–100)
Lab: 15 mL/min — ABNORMAL LOW (ref >60)
Lab: 18 mL/min — ABNORMAL LOW (ref >60)
Lab: 21 MMOL/L (ref 21–30)
Lab: 3.2 g/dL — ABNORMAL LOW (ref 3.5–5.0)
Lab: 3.5 mg/dL — ABNORMAL HIGH (ref 0.4–1.00)
Lab: 4.8 MMOL/L — ABNORMAL LOW (ref 3.5–5.1)
Lab: 51 mg/dL — ABNORMAL HIGH (ref 7–25)
Lab: 53 U/L (ref 25–110)
Lab: 6 U/L — ABNORMAL LOW (ref 7–56)
Lab: 6 g/dL (ref 6.0–8.0)
Lab: 7 U/L (ref 7–40)
Lab: 8.9 mg/dL (ref 8.5–10.6)
Lab: 8 (ref 3–12)

## 2018-03-27 LAB — POC GLUCOSE
Lab: 198 mg/dL — ABNORMAL HIGH (ref 70–100)
Lab: 142 mg/dL — ABNORMAL HIGH (ref 70–100)
Lab: 174 mg/dL — ABNORMAL HIGH (ref 70–100)
Lab: 168 mg/dL — ABNORMAL HIGH (ref 70–100)
Lab: 191 mg/dL — ABNORMAL HIGH (ref 70–100)
Lab: 318 mg/dL — ABNORMAL HIGH (ref 70–100)

## 2018-03-27 LAB — URINALYSIS MICROSCOPIC REFLEX TO CULTURE

## 2018-03-27 LAB — CBC: Lab: 9.2 10*3/uL (ref 4.5–11.0)

## 2018-03-27 LAB — URINALYSIS DIPSTICK REFLEX TO CULTURE
Lab: NEGATIVE
Lab: NEGATIVE
Lab: NEGATIVE
Lab: NEGATIVE

## 2018-03-27 LAB — GLUCOSE-CSF: Lab: 81 mg/dL — ABNORMAL HIGH (ref 40–75)

## 2018-03-27 LAB — GRAM STAIN

## 2018-03-27 LAB — LACTIC ACID(LACTATE): Lab: 0.6 MMOL/L (ref 0.5–2.0)

## 2018-03-27 LAB — TOTAL PROTEIN-CSF: Lab: 135 mg/dL — ABNORMAL HIGH (ref <5)

## 2018-03-27 MED ORDER — VANCOMYCIN RANDOM DOSING
1 | INTRAVENOUS | 0 refills | Status: DC
Start: 2018-03-27 — End: 2018-03-27

## 2018-03-27 MED ORDER — LINEZOLID IN DEXTROSE 5% 600 MG/300 ML IV PGBK
600 mg | Freq: Two times a day (BID) | INTRAVENOUS | 0 refills | Status: DC
Start: 2018-03-27 — End: 2018-04-06
  Administered 2018-03-28 – 2018-04-06 (×18): 600 mg via INTRAVENOUS

## 2018-03-27 MED ORDER — MEROPENEM IVP 1GRAM
1 g | Freq: Two times a day (BID) | INTRAVENOUS | 0 refills | Status: DC
Start: 2018-03-27 — End: 2018-04-01
  Administered 2018-03-27 – 2018-04-01 (×10): 1 g via INTRAVENOUS

## 2018-03-27 MED ORDER — OXYCODONE 5 MG PO TAB
5 mg | ORAL | 0 refills | Status: DC | PRN
Start: 2018-03-27 — End: 2018-04-06
  Administered 2018-03-27 – 2018-04-06 (×13): 5 mg via ORAL

## 2018-03-27 MED ORDER — HEPARIN, PORCINE (PF) 5,000 UNIT/0.5 ML IJ SYRG
5000 [IU] | SUBCUTANEOUS | 0 refills | Status: DC
Start: 2018-03-27 — End: 2018-04-06
  Administered 2018-03-29 – 2018-04-06 (×21): 5000 [IU] via SUBCUTANEOUS

## 2018-03-27 MED ORDER — VANCOMYCIN 1,500 MG IVPB
1500 mg | Freq: Once | INTRAVENOUS | 0 refills | Status: CP
Start: 2018-03-27 — End: ?
  Administered 2018-03-27 (×2): 1500 mg via INTRAVENOUS

## 2018-03-27 MED ORDER — FUROSEMIDE 10 MG/ML IJ SOLN
40 mg | Freq: Once | INTRAVENOUS | 0 refills | Status: CP
Start: 2018-03-27 — End: ?
  Administered 2018-03-27: 19:00:00 40 mg via INTRAVENOUS

## 2018-03-27 MED ORDER — VANCOMYCIN PHARMACY TO MANAGE
1 | 0 refills | Status: DC
Start: 2018-03-27 — End: 2018-03-27

## 2018-03-27 MED ADMIN — WATER FOR INJECTION, STERILE IJ SOLN [79513]: 20 mL | INTRAVENOUS | @ 16:00:00 | Stop: 2018-03-27 | NDC 00409488723

## 2018-03-28 ENCOUNTER — Inpatient Hospital Stay: Admit: 2018-03-28 | Discharge: 2018-03-28 | Payer: Private Health Insurance - Indemnity

## 2018-03-28 ENCOUNTER — Encounter: Admit: 2018-03-28 | Discharge: 2018-03-28 | Payer: Private Health Insurance - Indemnity

## 2018-03-28 DIAGNOSIS — I610 Nontraumatic intracerebral hemorrhage in hemisphere, subcortical: Principal | ICD-10-CM

## 2018-03-28 LAB — RVP VIRAL PANEL PCR

## 2018-03-28 LAB — PHOSPHORUS: Lab: 4.1 mg/dL — ABNORMAL LOW (ref >60)

## 2018-03-28 LAB — POC GLUCOSE
Lab: 178 mg/dL — ABNORMAL HIGH (ref 70–100)
Lab: 133 mg/dL — ABNORMAL HIGH (ref 70–100)
Lab: 89 mg/dL (ref 70–100)
Lab: 95 mg/dL — ABNORMAL LOW (ref 70–100)
Lab: 347 mg/dL — ABNORMAL HIGH (ref 70–100)

## 2018-03-28 LAB — CBC: Lab: 11 K/UL — ABNORMAL HIGH (ref 4.5–11.0)

## 2018-03-28 LAB — MAGNESIUM: Lab: 2.2 mg/dL — ABNORMAL LOW (ref >60)

## 2018-03-28 LAB — BASIC METABOLIC PANEL: Lab: 137 MMOL/L — ABNORMAL LOW (ref >60)

## 2018-03-28 LAB — CELL COUNT W/DIFF-CSF

## 2018-03-28 MED ORDER — PANTOPRAZOLE 40 MG PO TBEC
40 mg | Freq: Every day | ORAL | 0 refills | Status: DC
Start: 2018-03-28 — End: 2018-04-06
  Administered 2018-03-29 – 2018-04-06 (×9): 40 mg via ORAL

## 2018-03-28 MED ORDER — MIDAZOLAM 1 MG/ML IJ SOLN
1 mg | Freq: Once | INTRAVENOUS | 0 refills | Status: CP
Start: 2018-03-28 — End: ?
  Administered 2018-03-28: 14:00:00 0.5 mg via INTRAVENOUS

## 2018-03-28 MED ORDER — FENTANYL CITRATE (PF) 50 MCG/ML IJ SOLN
25-50 ug | Freq: Once | INTRAVENOUS | 0 refills | Status: CP
Start: 2018-03-28 — End: ?
  Administered 2018-03-28: 14:00:00 25 ug via INTRAVENOUS

## 2018-03-28 MED ORDER — MIDAZOLAM 1 MG/ML IJ SOLN
0 refills | Status: CP
Start: 2018-03-28 — End: ?
  Administered 2018-03-28: 14:00:00 0.5 mg via INTRAVENOUS

## 2018-03-28 MED ORDER — INSULIN GLARGINE 100 UNIT/ML (3 ML) SC INJ PEN
30 [IU] | Freq: Every day | SUBCUTANEOUS | 0 refills | Status: DC
Start: 2018-03-28 — End: 2018-03-30
  Administered 2018-03-29: 23:00:00 30 [IU] via SUBCUTANEOUS

## 2018-03-28 MED ORDER — LORAZEPAM 2 MG/ML IJ SOLN
.5 mg | Freq: Once | INTRAVENOUS | 0 refills | Status: CP
Start: 2018-03-28 — End: ?
  Administered 2018-03-28: 14:00:00 0.5 mg via INTRAVENOUS

## 2018-03-28 MED ADMIN — WATER FOR INJECTION, STERILE IJ SOLN [79513]: 20 mL | INTRAVENOUS | @ 16:00:00 | Stop: 2018-03-28 | NDC 00409488723

## 2018-03-29 ENCOUNTER — Encounter: Admit: 2018-03-29 | Discharge: 2018-03-29 | Payer: Private Health Insurance - Indemnity

## 2018-03-29 DIAGNOSIS — I610 Nontraumatic intracerebral hemorrhage in hemisphere, subcortical: Principal | ICD-10-CM

## 2018-03-29 LAB — IRON + BINDING CAPACITY + %SAT+ FERRITIN
Lab: 15 ug/dL — ABNORMAL LOW (ref 50–160)
Lab: 247 ug/dL — ABNORMAL LOW (ref 270–380)
Lab: 35 ng/mL (ref 10–200)
Lab: 6 % — ABNORMAL LOW (ref 28–42)

## 2018-03-29 LAB — COMPREHENSIVE METABOLIC PANEL
Lab: 134 MMOL/L — ABNORMAL LOW (ref 137–147)
Lab: 3 mg/dL — ABNORMAL HIGH (ref 0.4–1.00)
Lab: 44 mg/dL — ABNORMAL HIGH (ref 7–25)

## 2018-03-29 LAB — MAGNESIUM
Lab: 2.3 mg/dL — ABNORMAL LOW (ref >60)
Lab: 2.3 mg/dL — ABNORMAL LOW (ref 1.6–2.6)

## 2018-03-29 LAB — CBC
Lab: 7.3 K/UL — ABNORMAL LOW (ref 4.5–11.0)
Lab: 18 10*3/uL — ABNORMAL HIGH (ref 4.5–11.0)

## 2018-03-29 LAB — POC IONIZED CALCIUM
Lab: 1.2 MMOL/L (ref 1.0–1.3)
Lab: 1.2 MMOL/L (ref 1.0–1.3)

## 2018-03-29 LAB — BASIC METABOLIC PANEL: Lab: 132 MMOL/L — ABNORMAL LOW (ref >60)

## 2018-03-29 LAB — POC BLOOD GAS ARTERIAL
Lab: 104 mmHg — ABNORMAL HIGH (ref 80–100)
Lab: 20 MMOL/L — ABNORMAL LOW (ref 21–28)
Lab: 32 mmHg — ABNORMAL LOW (ref 35–45)
Lab: 4 MMOL/L (ref 32.0–36.0)
Lab: 7.4 % (ref 7.35–7.45)
Lab: 98 % (ref 95–99)
Lab: 20 MMOL/L — ABNORMAL LOW (ref 21–28)
Lab: 28 mmHg — ABNORMAL LOW (ref 35–45)
Lab: 3 MMOL/L
Lab: 55 mmHg — ABNORMAL LOW (ref 80–100)
Lab: 7.4 — ABNORMAL HIGH (ref 7.35–7.45)
Lab: 91 % — ABNORMAL LOW (ref 95–99)

## 2018-03-29 LAB — POC GLUCOSE
Lab: 287 mg/dL — ABNORMAL HIGH (ref 70–100)
Lab: 218 mg/dL — ABNORMAL HIGH (ref 70–100)
Lab: 265 mg/dL — ABNORMAL HIGH (ref 70–100)
Lab: 308 mg/dL — ABNORMAL HIGH (ref 70–100)
Lab: 407 mg/dL — ABNORMAL HIGH (ref 70–100)

## 2018-03-29 LAB — PHOSPHORUS
Lab: 3.9 mg/dL — ABNORMAL LOW (ref >60)
Lab: 4.2 mg/dL — ABNORMAL LOW (ref 2.0–4.5)

## 2018-03-29 LAB — CULTURE-URINE W/SENSITIVITY
Lab: <10
Lab: <10 — AB

## 2018-03-29 LAB — TROPONIN-I

## 2018-03-29 LAB — POC HEMATOCRIT
Lab: 27 % — ABNORMAL LOW (ref 36–45)
Lab: 9.2 g/dL — ABNORMAL LOW (ref >60)
Lab: 26 % — ABNORMAL LOW (ref 36–45)
Lab: 8.8 g/dL — ABNORMAL LOW (ref 12.0–15.0)

## 2018-03-29 LAB — POC LACTATE: Lab: 0.6 MMOL/L (ref 0.5–2.0)

## 2018-03-29 LAB — POC POTASSIUM
Lab: 4.6 MMOL/L (ref 3.5–5.1)
Lab: 4.7 MMOL/L — AB (ref 3.5–5.1)

## 2018-03-29 LAB — POC SODIUM
Lab: 136 MMOL/L — ABNORMAL LOW (ref 137–147)
Lab: 133 MMOL/L — ABNORMAL LOW (ref 137–147)

## 2018-03-29 MED ORDER — FUROSEMIDE 10 MG/ML IJ SOLN
40 mg | Freq: Once | INTRAVENOUS | 0 refills | Status: CP
Start: 2018-03-29 — End: ?
  Administered 2018-03-29: 20:00:00 40 mg via INTRAVENOUS

## 2018-03-29 MED ORDER — NICARDIPINE IN NACL (ISO-OS) 20 MG/200 ML IV PGBK
5-15 mg/h | INTRAVENOUS | 0 refills | Status: DC
Start: 2018-03-29 — End: 2018-03-31
  Administered 2018-03-29: 22:00:00 5 mg/h via INTRAVENOUS

## 2018-03-29 MED ORDER — ALBUTEROL SULFATE 2.5 MG /3 ML (0.083 %) IN NEBU
2.5 mg | Freq: Once | RESPIRATORY_TRACT | 0 refills | Status: CP
Start: 2018-03-29 — End: ?
  Administered 2018-03-29: 16:00:00 2.5 mg via RESPIRATORY_TRACT

## 2018-03-29 MED ORDER — PROPOFOL INJ 10 MG/ML IV VIAL
0 refills | Status: DC
Start: 2018-03-29 — End: 2018-03-29
  Administered 2018-03-29: 15:00:00 80 mg via INTRAVENOUS
  Administered 2018-03-29: 15:00:00 50 mg via INTRAVENOUS

## 2018-03-29 MED ORDER — NITROGLYCERIN 0.4 MG SL SUBL
.4 mg | Freq: Once | SUBLINGUAL | 0 refills | Status: CP
Start: 2018-03-29 — End: ?
  Administered 2018-03-29: 19:00:00 0.4 mg via SUBLINGUAL

## 2018-03-29 MED ORDER — SODIUM CHLORIDE 0.9 % IV SOLP
0 refills | Status: DC
Start: 2018-03-29 — End: 2018-03-29
  Administered 2018-03-29: 15:00:00 via INTRAVENOUS

## 2018-03-29 MED ORDER — FENTANYL CITRATE (PF) 50 MCG/ML IJ SOLN
25-50 ug | INTRAVENOUS | 0 refills | Status: DC | PRN
Start: 2018-03-29 — End: 2018-04-02

## 2018-03-29 MED ORDER — LACTATED RINGERS IV SOLP
1000 mL | Freq: Once | INTRAVENOUS | 0 refills | Status: CP
Start: 2018-03-29 — End: ?
  Administered 2018-03-29: 13:00:00 1000 mL via INTRAVENOUS

## 2018-03-29 MED ORDER — PROPOFOL 10 MG/ML IV EMUL 20 ML (INFUSION)(AM)(OR)
INTRAVENOUS | 0 refills | Status: DC
Start: 2018-03-29 — End: 2018-03-29
  Administered 2018-03-29: 15:00:00 140 ug/kg/min via INTRAVENOUS

## 2018-03-29 MED ORDER — LIDOCAINE (PF) 200 MG/10 ML (2 %) IJ SYRG
0 refills | Status: DC
Start: 2018-03-29 — End: 2018-03-29
  Administered 2018-03-29: 15:00:00 100 mg via INTRAVENOUS

## 2018-03-29 MED ADMIN — WATER FOR INJECTION, STERILE IJ SOLN [79513]: INTRAVENOUS | @ 23:00:00 | Stop: 2018-03-29 | NDC 00409488723

## 2018-03-29 MED ADMIN — WATER FOR INJECTION, STERILE IJ SOLN [79513]: 20 mL | INTRAVENOUS | @ 03:00:00 | Stop: 2018-03-29 | NDC 00409488723

## 2018-03-30 LAB — CBC
Lab: 13 K/UL — ABNORMAL HIGH (ref 4.5–11.0)
Lab: 7.7 g/dL — ABNORMAL LOW (ref 12.0–15.0)

## 2018-03-30 LAB — POC GLUCOSE
Lab: 318 mg/dL — ABNORMAL HIGH (ref 70–100)
Lab: 349 mg/dL — ABNORMAL HIGH (ref 70–100)
Lab: 251 mg/dL — ABNORMAL HIGH (ref 70–100)
Lab: 93 mg/dL — AB (ref 70–100)
Lab: 112 mg/dL — ABNORMAL HIGH (ref 70–100)
Lab: 156 mg/dL — ABNORMAL HIGH (ref 70–100)

## 2018-03-30 LAB — BETA-HCG: Lab: 1 U/L — ABNORMAL LOW (ref <5)

## 2018-03-30 LAB — MAGNESIUM: Lab: 2.5 mg/dL (ref 1.6–2.6)

## 2018-03-30 LAB — CULTURE-CSF W/SENSITIVITY

## 2018-03-30 LAB — BASIC METABOLIC PANEL: Lab: 138 MMOL/L — ABNORMAL LOW (ref >60)

## 2018-03-30 LAB — PHOSPHORUS: Lab: 4.1 mg/dL — ABNORMAL LOW (ref >60)

## 2018-03-30 LAB — CELIAC SCREEN

## 2018-03-30 MED ORDER — INSULIN GLARGINE 100 UNIT/ML (3 ML) SC INJ PEN
28 [IU] | Freq: Every day | SUBCUTANEOUS | 0 refills | Status: DC
Start: 2018-03-30 — End: 2018-03-30

## 2018-03-30 MED ORDER — INSULIN GLARGINE 100 UNIT/ML (3 ML) SC INJ PEN
26 [IU] | Freq: Every day | SUBCUTANEOUS | 0 refills | Status: DC
Start: 2018-03-30 — End: 2018-04-01

## 2018-03-30 MED ORDER — POLYETHYLENE GLYCOL 3350 17 GRAM PO PWPK
1 | Freq: Two times a day (BID) | ORAL | 0 refills | Status: DC
Start: 2018-03-30 — End: 2018-04-06
  Administered 2018-03-30 – 2018-04-06 (×11): 17 g via ORAL

## 2018-03-30 MED ORDER — FERROUS SULFATE 325 MG (65 MG IRON) PO TAB
325 mg | Freq: Three times a day (TID) | ORAL | 0 refills | Status: DC
Start: 2018-03-30 — End: 2018-04-06
  Administered 2018-03-30 – 2018-04-06 (×20): 325 mg via ORAL

## 2018-03-30 MED ORDER — BISACODYL 10 MG RE SUPP
10 mg | Freq: Once | RECTAL | 0 refills | Status: AC
Start: 2018-03-30 — End: ?

## 2018-03-30 MED ADMIN — WATER FOR INJECTION, STERILE IJ SOLN [79513]: 20 mL | INTRAVENOUS | @ 22:00:00 | Stop: 2018-03-30 | NDC 00409488723

## 2018-03-30 MED ADMIN — WATER FOR INJECTION, STERILE IJ SOLN [79513]: 20 mL | INTRAVENOUS | @ 10:00:00 | Stop: 2018-03-30 | NDC 00409488723

## 2018-03-31 LAB — POC GLUCOSE
Lab: 136 mg/dL — ABNORMAL HIGH (ref 70–100)
Lab: 140 mg/dL — ABNORMAL HIGH (ref 70–100)
Lab: 114 mg/dL — ABNORMAL HIGH (ref 70–100)
Lab: 200 mg/dL — ABNORMAL HIGH (ref 70–100)
Lab: 259 mg/dL — ABNORMAL HIGH (ref 70–100)
Lab: 200 mg/dL — ABNORMAL HIGH (ref 70–100)

## 2018-03-31 LAB — PHOSPHORUS: Lab: 4.1 mg/dL (ref >60)

## 2018-03-31 LAB — MAGNESIUM: Lab: 2.4 mg/dL — ABNORMAL LOW (ref >60)

## 2018-03-31 LAB — CBC
Lab: 3.2 M/UL — ABNORMAL LOW (ref 4.0–5.0)
Lab: 9.1 K/UL — ABNORMAL HIGH (ref 4.5–11.0)

## 2018-03-31 LAB — BASIC METABOLIC PANEL
Lab: 137 MMOL/L — ABNORMAL LOW (ref >60)
Lab: 4.1 MMOL/L — ABNORMAL LOW (ref >60)

## 2018-03-31 MED ORDER — INSULIN ASPART 100 UNIT/ML SC FLEXPEN
1-25 [IU] | Freq: Three times a day (TID) | SUBCUTANEOUS | 0 refills | Status: DC
Start: 2018-03-31 — End: 2018-04-02

## 2018-03-31 MED ORDER — ARTIFICIAL SALIVA (YERBAS-LYT) MM SPRA
1 | OROMUCOSAL | 0 refills | Status: DC | PRN
Start: 2018-03-31 — End: 2018-04-06
  Administered 2018-03-31: 14:00:00 1 via OROMUCOSAL

## 2018-03-31 MED ORDER — HYDRALAZINE 100 MG PO TAB
100 mg | Freq: Three times a day (TID) | ORAL | 0 refills | Status: DC
Start: 2018-03-31 — End: 2018-04-06
  Administered 2018-03-31 – 2018-04-06 (×19): 100 mg via ORAL

## 2018-03-31 MED ADMIN — WATER FOR INJECTION, STERILE IJ SOLN [79513]: 20 mL | INTRAVENOUS | @ 23:00:00 | Stop: 2018-03-31 | NDC 00409488723

## 2018-03-31 MED ADMIN — WATER FOR INJECTION, STERILE IJ SOLN [79513]: 20 mL | INTRAVENOUS | @ 09:00:00 | Stop: 2018-03-31 | NDC 00409488723

## 2018-04-01 ENCOUNTER — Encounter: Admit: 2018-04-01 | Discharge: 2018-04-01 | Payer: Private Health Insurance - Indemnity

## 2018-04-01 DIAGNOSIS — E119 Type 2 diabetes mellitus without complications: Principal | ICD-10-CM

## 2018-04-01 DIAGNOSIS — I10 Essential (primary) hypertension: ICD-10-CM

## 2018-04-01 LAB — COMPREHENSIVE METABOLIC PANEL
Lab: 0.3 mg/dL (ref 0.3–1.2)
Lab: 10 U/L (ref 7–40)
Lab: 107 MMOL/L — ABNORMAL LOW (ref 98–110)
Lab: 140 MMOL/L — ABNORMAL LOW (ref >60)
Lab: 19 mL/min — ABNORMAL LOW (ref >60)
Lab: 2.7 mg/dL — ABNORMAL HIGH (ref 0.4–1.00)
Lab: 2.9 g/dL — ABNORMAL LOW (ref 3.5–5.0)
Lab: 23 mL/min — ABNORMAL LOW (ref >60)
Lab: 25 MMOL/L (ref 21–30)
Lab: 34 mg/dL — ABNORMAL HIGH (ref 7–25)
Lab: 37 U/L (ref 25–110)
Lab: 4.2 MMOL/L — ABNORMAL LOW (ref 3.5–5.1)
Lab: 5 U/L — ABNORMAL LOW (ref 7–56)
Lab: 5.6 g/dL — ABNORMAL LOW (ref 6.0–8.0)
Lab: 61 mg/dL — ABNORMAL LOW (ref 70–100)
Lab: 8.8 mg/dL (ref 8.5–10.6)
Lab: 8 (ref 3–12)

## 2018-04-01 LAB — CBC: Lab: 6.9 K/UL (ref >60)

## 2018-04-01 LAB — POC GLUCOSE
Lab: 105 mg/dL — ABNORMAL HIGH (ref 70–100)
Lab: 63 mg/dL — ABNORMAL LOW (ref 70–100)
Lab: 96 mg/dL — AB (ref 70–100)
Lab: 243 mg/dL — ABNORMAL HIGH (ref 70–100)
Lab: 293 mg/dL — ABNORMAL HIGH (ref 70–100)
Lab: 221 mg/dL — ABNORMAL HIGH (ref 70–100)

## 2018-04-01 MED ORDER — SENNOSIDES-DOCUSATE SODIUM 8.6-50 MG PO TAB
1 | Freq: Two times a day (BID) | ORAL | 0 refills | Status: CN
Start: 2018-04-01 — End: ?

## 2018-04-01 MED ORDER — CEFEPIME 2G/100ML NS IVPB (MB+)
2 g | Freq: Two times a day (BID) | INTRAVENOUS | 0 refills | Status: DC
Start: 2018-04-01 — End: 2018-04-06
  Administered 2018-04-01 – 2018-04-06 (×19): 2 g via INTRAVENOUS

## 2018-04-01 MED ORDER — GABAPENTIN 300 MG PO CAP
300 mg | ORAL_CAPSULE | ORAL | 0 refills | Status: CN | PRN
Start: 2018-04-01 — End: ?

## 2018-04-01 MED ORDER — HYDRALAZINE 20 MG/ML IJ SOLN
10 mg | INTRAVENOUS | 0 refills | Status: DC | PRN
Start: 2018-04-01 — End: 2018-04-06

## 2018-04-01 MED ORDER — CLONIDINE HCL 0.2 MG PO TAB
.2 mg | Freq: Three times a day (TID) | ORAL | 0 refills | Status: DC
Start: 2018-04-01 — End: 2018-04-06
  Administered 2018-04-01 – 2018-04-06 (×16): 0.2 mg via ORAL

## 2018-04-01 MED ORDER — METHOCARBAMOL 750 MG PO TAB
750 mg | ORAL_TABLET | Freq: Two times a day (BID) | ORAL | 0 refills | Status: CN | PRN
Start: 2018-04-01 — End: ?

## 2018-04-01 MED ORDER — FUROSEMIDE 20 MG PO TAB
40 mg | Freq: Every day | ORAL | 0 refills | Status: DC
Start: 2018-04-01 — End: 2018-04-06
  Administered 2018-04-01 – 2018-04-06 (×6): 40 mg via ORAL

## 2018-04-01 MED ORDER — DOCUSATE SODIUM 100 MG PO CAP
100 mg | ORAL_CAPSULE | Freq: Two times a day (BID) | ORAL | 3 refills | Status: CN
Start: 2018-04-01 — End: ?

## 2018-04-01 MED ORDER — HYDRALAZINE 100 MG PO TAB
100 mg | Freq: Three times a day (TID) | ORAL | 0 refills | Status: CN
Start: 2018-04-01 — End: ?

## 2018-04-01 MED ORDER — INSULIN GLARGINE 100 UNIT/ML (3 ML) SC INJ PEN
23 [IU] | Freq: Every day | SUBCUTANEOUS | 0 refills | Status: DC
Start: 2018-04-01 — End: 2018-04-05

## 2018-04-02 LAB — COMPREHENSIVE METABOLIC PANEL: Lab: 135 MMOL/L — ABNORMAL LOW (ref <100)

## 2018-04-02 LAB — POC GLUCOSE
Lab: 158 mg/dL — ABNORMAL HIGH (ref 70–100)
Lab: 161 mg/dL — ABNORMAL HIGH (ref 70–100)
Lab: 291 mg/dL — ABNORMAL HIGH (ref 70–100)
Lab: 281 mg/dL — ABNORMAL HIGH (ref 70–100)
Lab: 231 mg/dL — ABNORMAL HIGH (ref 70–100)

## 2018-04-02 LAB — CBC: Lab: 8.1 K/UL — ABNORMAL LOW (ref <150)

## 2018-04-02 LAB — CULTURE-BLOOD W/SENSITIVITY

## 2018-04-02 MED ORDER — SENNOSIDES-DOCUSATE SODIUM 8.6-50 MG PO TAB
2 | Freq: Two times a day (BID) | ORAL | 0 refills | Status: DC
Start: 2018-04-02 — End: 2018-04-06
  Administered 2018-04-03 – 2018-04-06 (×8): 2 via ORAL

## 2018-04-02 MED ORDER — INSULIN ASPART 100 UNIT/ML SC FLEXPEN
1-25 [IU] | Freq: Three times a day (TID) | SUBCUTANEOUS | 0 refills | Status: DC
Start: 2018-04-02 — End: 2018-04-06

## 2018-04-03 LAB — COMPREHENSIVE METABOLIC PANEL
Lab: 136 MMOL/L — ABNORMAL LOW (ref >60)
Lab: 4.4 MMOL/L — ABNORMAL HIGH (ref 3.5–5.1)

## 2018-04-03 LAB — POC GLUCOSE
Lab: 144 mg/dL — ABNORMAL HIGH (ref 70–100)
Lab: 153 mg/dL — ABNORMAL HIGH (ref 70–100)
Lab: 154 mg/dL — ABNORMAL HIGH (ref 70–100)
Lab: 224 mg/dL — ABNORMAL HIGH (ref 70–100)
Lab: 161 mg/dL — ABNORMAL HIGH (ref 70–100)

## 2018-04-03 LAB — CBC: Lab: 8.3 K/UL — ABNORMAL LOW (ref 4.5–11.0)

## 2018-04-03 MED ORDER — ACETAMINOPHEN 500 MG PO TAB
500 mg | ORAL | 0 refills | Status: DC | PRN
Start: 2018-04-03 — End: 2018-04-06
  Administered 2018-04-04 – 2018-04-05 (×6): 500 mg via ORAL

## 2018-04-04 LAB — POC GLUCOSE
Lab: 178 mg/dL — ABNORMAL HIGH (ref 70–100)
Lab: 164 mg/dL — ABNORMAL HIGH (ref 70–100)
Lab: 105 mg/dL — ABNORMAL HIGH (ref 70–100)
Lab: 180 mg/dL — ABNORMAL HIGH (ref 70–100)
Lab: 303 mg/dL — ABNORMAL HIGH (ref 70–100)
Lab: 171 mg/dL — ABNORMAL HIGH (ref 70–100)

## 2018-04-04 LAB — CBC
Lab: 8.1 g/dL — ABNORMAL LOW (ref 12.0–15.0)
Lab: 8.3 K/UL — ABNORMAL LOW (ref >60)

## 2018-04-04 LAB — COMPREHENSIVE METABOLIC PANEL: Lab: 137 MMOL/L — ABNORMAL LOW (ref 137–147)

## 2018-04-05 LAB — POC GLUCOSE
Lab: 107 mg/dL — ABNORMAL HIGH (ref 70–100)
Lab: 72 mg/dL (ref 70–100)
Lab: 98 mg/dL (ref 70–100)
Lab: 128 mg/dL — ABNORMAL HIGH (ref 70–100)
Lab: 157 mg/dL — ABNORMAL HIGH (ref 70–100)
Lab: 285 mg/dL — ABNORMAL HIGH (ref 70–100)
Lab: 168 mg/dL — ABNORMAL HIGH (ref 70–100)

## 2018-04-05 LAB — COMPREHENSIVE METABOLIC PANEL: Lab: 136 MMOL/L — ABNORMAL LOW (ref >60)

## 2018-04-05 LAB — CBC: Lab: 7.1 K/UL — ABNORMAL LOW (ref 4.5–11.0)

## 2018-04-05 MED ORDER — INSULIN GLARGINE 100 UNIT/ML (3 ML) SC INJ PEN
21 [IU] | Freq: Every day | SUBCUTANEOUS | 0 refills | Status: DC
Start: 2018-04-05 — End: 2018-04-06

## 2018-04-06 ENCOUNTER — Inpatient Hospital Stay: Admit: 2018-03-27 | Discharge: 2018-03-27 | Payer: Private Health Insurance - Indemnity

## 2018-04-06 ENCOUNTER — Ambulatory Visit: Admit: 2018-03-23 | Discharge: 2018-03-23 | Payer: Private Health Insurance - Indemnity

## 2018-04-06 ENCOUNTER — Inpatient Hospital Stay: Admit: 2018-03-19 | Discharge: 2018-03-19 | Payer: Private Health Insurance - Indemnity

## 2018-04-06 ENCOUNTER — Inpatient Hospital Stay: Admit: 2018-03-23 | Discharge: 2018-03-23 | Payer: Private Health Insurance - Indemnity

## 2018-04-06 ENCOUNTER — Inpatient Hospital Stay: Admit: 2018-03-17 | Discharge: 2018-03-17 | Payer: Private Health Insurance - Indemnity

## 2018-04-06 ENCOUNTER — Inpatient Hospital Stay: Admit: 2018-03-29 | Discharge: 2018-03-29 | Payer: Private Health Insurance - Indemnity

## 2018-04-06 ENCOUNTER — Inpatient Hospital Stay: Admit: 2018-03-22 | Discharge: 2018-03-22 | Payer: Private Health Insurance - Indemnity

## 2018-04-06 ENCOUNTER — Inpatient Hospital Stay: Admit: 2018-03-28 | Discharge: 2018-03-28 | Payer: Private Health Insurance - Indemnity

## 2018-04-06 ENCOUNTER — Inpatient Hospital Stay: Admit: 2018-03-27 | Discharge: 2018-03-28 | Payer: Private Health Insurance - Indemnity

## 2018-04-06 DIAGNOSIS — Z98891 History of uterine scar from previous surgery: ICD-10-CM

## 2018-04-06 DIAGNOSIS — R933 Abnormal findings on diagnostic imaging of other parts of digestive tract: ICD-10-CM

## 2018-04-06 DIAGNOSIS — N183 Chronic kidney disease, stage 3 (moderate): ICD-10-CM

## 2018-04-06 DIAGNOSIS — Z66 Do not resuscitate: ICD-10-CM

## 2018-04-06 DIAGNOSIS — A419 Sepsis, unspecified organism: ICD-10-CM

## 2018-04-06 DIAGNOSIS — I161 Hypertensive emergency: ICD-10-CM

## 2018-04-06 DIAGNOSIS — Z9641 Presence of insulin pump (external) (internal): ICD-10-CM

## 2018-04-06 DIAGNOSIS — R402133 Coma scale, eyes open, to sound, at hospital admission: ICD-10-CM

## 2018-04-06 DIAGNOSIS — R4189 Other symptoms and signs involving cognitive functions and awareness: ICD-10-CM

## 2018-04-06 DIAGNOSIS — R4701 Aphasia: ICD-10-CM

## 2018-04-06 DIAGNOSIS — I615 Nontraumatic intracerebral hemorrhage, intraventricular: ICD-10-CM

## 2018-04-06 DIAGNOSIS — R1013 Epigastric pain: ICD-10-CM

## 2018-04-06 DIAGNOSIS — G4733 Obstructive sleep apnea (adult) (pediatric): ICD-10-CM

## 2018-04-06 DIAGNOSIS — N136 Pyonephrosis: ICD-10-CM

## 2018-04-06 DIAGNOSIS — N319 Neuromuscular dysfunction of bladder, unspecified: ICD-10-CM

## 2018-04-06 DIAGNOSIS — Z882 Allergy status to sulfonamides status: ICD-10-CM

## 2018-04-06 DIAGNOSIS — R34 Anuria and oliguria: ICD-10-CM

## 2018-04-06 DIAGNOSIS — E1022 Type 1 diabetes mellitus with diabetic chronic kidney disease: ICD-10-CM

## 2018-04-06 DIAGNOSIS — G009 Bacterial meningitis, unspecified: ICD-10-CM

## 2018-04-06 DIAGNOSIS — G9389 Other specified disorders of brain: ICD-10-CM

## 2018-04-06 DIAGNOSIS — J9601 Acute respiratory failure with hypoxia: ICD-10-CM

## 2018-04-06 DIAGNOSIS — E1021 Type 1 diabetes mellitus with diabetic nephropathy: ICD-10-CM

## 2018-04-06 DIAGNOSIS — K5900 Constipation, unspecified: ICD-10-CM

## 2018-04-06 DIAGNOSIS — R402243 Coma scale, best verbal response, confused conversation, at hospital admission: ICD-10-CM

## 2018-04-06 DIAGNOSIS — D649 Anemia, unspecified: ICD-10-CM

## 2018-04-06 DIAGNOSIS — E10319 Type 1 diabetes mellitus with unspecified diabetic retinopathy without macular edema: ICD-10-CM

## 2018-04-06 DIAGNOSIS — N179 Acute kidney failure, unspecified: ICD-10-CM

## 2018-04-06 DIAGNOSIS — G9341 Metabolic encephalopathy: ICD-10-CM

## 2018-04-06 DIAGNOSIS — I129 Hypertensive chronic kidney disease with stage 1 through stage 4 chronic kidney disease, or unspecified chronic kidney disease: ICD-10-CM

## 2018-04-06 DIAGNOSIS — E10649 Type 1 diabetes mellitus with hypoglycemia without coma: ICD-10-CM

## 2018-04-06 DIAGNOSIS — E1065 Type 1 diabetes mellitus with hyperglycemia: ICD-10-CM

## 2018-04-06 DIAGNOSIS — Z794 Long term (current) use of insulin: ICD-10-CM

## 2018-04-06 DIAGNOSIS — Z79899 Other long term (current) drug therapy: ICD-10-CM

## 2018-04-06 DIAGNOSIS — E875 Hyperkalemia: ICD-10-CM

## 2018-04-06 DIAGNOSIS — I610 Nontraumatic intracerebral hemorrhage in hemisphere, subcortical: Principal | ICD-10-CM

## 2018-04-06 DIAGNOSIS — E1042 Type 1 diabetes mellitus with diabetic polyneuropathy: ICD-10-CM

## 2018-04-06 DIAGNOSIS — R402363 Coma scale, best motor response, obeys commands, at hospital admission: ICD-10-CM

## 2018-04-06 DIAGNOSIS — Z9119 Patient's noncompliance with other medical treatment and regimen: ICD-10-CM

## 2018-04-06 DIAGNOSIS — I69241 Monoplegia of lower limb following other nontraumatic intracranial hemorrhage affecting right dominant side: Principal | ICD-10-CM

## 2018-04-06 LAB — POC GLUCOSE
Lab: 143 mg/dL — ABNORMAL HIGH (ref 70–100)
Lab: 80 mg/dL (ref 70–100)
Lab: 165 mg/dL — ABNORMAL HIGH (ref 70–100)
Lab: 206 mg/dL — ABNORMAL HIGH (ref 70–100)
Lab: 115 mg/dL — ABNORMAL HIGH (ref 70–100)

## 2018-04-06 MED ORDER — MAGNESIUM HYDROXIDE 2,400 MG/10 ML PO SUSP
10 mL | ORAL | 0 refills | Status: DC | PRN
Start: 2018-04-06 — End: 2018-04-18
  Administered 2018-04-15 (×2): 10 mL via ORAL

## 2018-04-06 MED ORDER — LINEZOLID IN DEXTROSE 5% 600 MG/300 ML IV PGBK
600 mg | Freq: Two times a day (BID) | INTRAVENOUS | 0 refills | Status: DC
Start: 2018-04-06 — End: 2018-04-06

## 2018-04-06 MED ORDER — OXYCODONE 5 MG PO TAB
5 mg | ORAL | 0 refills | Status: DC | PRN
Start: 2018-04-06 — End: 2018-05-05
  Administered 2018-04-07 – 2018-04-22 (×7): 5 mg via ORAL

## 2018-04-06 MED ORDER — FERROUS SULFATE 325 MG (65 MG IRON) PO TAB
325 mg | Freq: Three times a day (TID) | ORAL | 0 refills | Status: DC
Start: 2018-04-06 — End: 2018-04-14
  Administered 2018-04-06 – 2018-04-13 (×22): 325 mg via ORAL

## 2018-04-06 MED ORDER — CLONIDINE HCL 0.2 MG PO TAB
.2 mg | ORAL_TABLET | Freq: Three times a day (TID) | ORAL | 3 refills | Status: AC
Start: 2018-04-06 — End: 2018-05-04

## 2018-04-06 MED ORDER — FUROSEMIDE 40 MG PO TAB
40 mg | ORAL_TABLET | Freq: Every day | ORAL | 3 refills | 90.00000 days | Status: AC
Start: 2018-04-06 — End: 2018-05-04

## 2018-04-06 MED ORDER — INSULIN ASPART 100 UNIT/ML SC FLEXPEN
1-25 [IU] | Freq: Three times a day (TID) | SUBCUTANEOUS | 3 refills | 42.00000 days | Status: AC
Start: 2018-04-06 — End: 2018-05-04

## 2018-04-06 MED ORDER — PANTOPRAZOLE 40 MG PO TBEC
40 mg | ORAL_TABLET | Freq: Every day | ORAL | 3 refills | 90.00000 days | Status: AC
Start: 2018-04-06 — End: 2018-05-04

## 2018-04-06 MED ORDER — PANTOPRAZOLE 40 MG PO TBEC
40 mg | Freq: Every day | ORAL | 0 refills | Status: DC
Start: 2018-04-06 — End: 2018-05-05
  Administered 2018-04-08 – 2018-05-04 (×25): 40 mg via ORAL

## 2018-04-06 MED ORDER — AMLODIPINE 10 MG PO TAB
10 mg | Freq: Every day | ORAL | 0 refills | Status: DC
Start: 2018-04-06 — End: 2018-05-05
  Administered 2018-04-07 – 2018-05-04 (×26): 10 mg via ORAL

## 2018-04-06 MED ORDER — POLYETHYLENE GLYCOL 3350 17 GRAM PO PWPK
17 g | Freq: Two times a day (BID) | ORAL | 0 refills | 18.00000 days | Status: AC
Start: 2018-04-06 — End: 2018-05-04

## 2018-04-06 MED ORDER — LINEZOLID IN DEXTROSE 5% 600 MG/300 ML IV PGBK
600 mg | Freq: Two times a day (BID) | INTRAVENOUS | 0 refills | 50.00000 days | Status: AC
Start: 2018-04-06 — End: 2018-05-04

## 2018-04-06 MED ORDER — ARTIFICIAL SALIVA (YERBAS-LYT) MM SPRA
1 | OROMUCOSAL | 0 refills | 30.00000 days | Status: AC | PRN
Start: 2018-04-06 — End: 2018-05-04

## 2018-04-06 MED ORDER — ACETAMINOPHEN 325 MG PO TAB
650 mg | ORAL | 0 refills | Status: DC | PRN
Start: 2018-04-06 — End: 2018-05-05
  Administered 2018-04-08 – 2018-04-28 (×22): 650 mg via ORAL

## 2018-04-06 MED ORDER — HYDRALAZINE 100 MG PO TAB
100 mg | ORAL_TABLET | Freq: Three times a day (TID) | ORAL | 0 refills | 30.00000 days | Status: AC
Start: 2018-04-06 — End: 2018-05-04

## 2018-04-06 MED ORDER — INSULIN ASPART 100 UNIT/ML SC FLEXPEN
0-12 [IU] | Freq: Every day | SUBCUTANEOUS | 0 refills | Status: DC
Start: 2018-04-06 — End: 2018-04-06

## 2018-04-06 MED ORDER — FERROUS SULFATE 325 MG (65 MG IRON) PO TAB
325 mg | ORAL_TABLET | Freq: Three times a day (TID) | ORAL | 3 refills | Status: AC
Start: 2018-04-06 — End: 2018-05-04

## 2018-04-06 MED ORDER — INSULIN GLARGINE 100 UNIT/ML (3 ML) SC INJ PEN
21 [IU] | Freq: Every day | SUBCUTANEOUS | 0 refills | Status: DC
Start: 2018-04-06 — End: 2018-04-06

## 2018-04-06 MED ORDER — MAGNESIUM HYDROXIDE 2,400 MG/10 ML PO SUSP
10 mL | Freq: Every day | ORAL | 0 refills | 2.00000 days | Status: AC
Start: 2018-04-06 — End: 2018-05-04

## 2018-04-06 MED ORDER — CLONIDINE HCL 0.1 MG PO TAB
.2 mg | Freq: Three times a day (TID) | ORAL | 0 refills | Status: DC
Start: 2018-04-06 — End: 2018-05-05
  Administered 2018-04-06 – 2018-05-04 (×84): 0.2 mg via ORAL

## 2018-04-06 MED ORDER — INSULIN ASPART 100 UNIT/ML SC FLEXPEN
1-25 [IU] | Freq: Three times a day (TID) | SUBCUTANEOUS | 0 refills | Status: DC
Start: 2018-04-06 — End: 2018-04-12
  Administered 2018-04-08: 23:00:00 1 [IU] via SUBCUTANEOUS

## 2018-04-06 MED ORDER — LIDOCAINE 5 % TP PTMD
1 | MEDICATED_PATCH | Freq: Two times a day (BID) | TOPICAL | 0 refills | 30.00000 days | Status: AC
Start: 2018-04-06 — End: 2018-05-04

## 2018-04-06 MED ORDER — HYDRALAZINE 50 MG PO TAB
100 mg | Freq: Three times a day (TID) | ORAL | 0 refills | Status: DC
Start: 2018-04-06 — End: 2018-04-22
  Administered 2018-04-06 – 2018-04-22 (×47): 100 mg via ORAL

## 2018-04-06 MED ORDER — AMLODIPINE 10 MG PO TAB
10 mg | ORAL_TABLET | Freq: Every day | ORAL | 3 refills | Status: AC
Start: 2018-04-06 — End: 2018-05-04

## 2018-04-06 MED ORDER — CEFEPIME 2G/100ML NS IVPB (MB+)
2 g | Freq: Two times a day (BID) | INTRAVENOUS | 0 refills | Status: CP
Start: 2018-04-06 — End: ?
  Administered 2018-04-06 – 2018-04-09 (×13): 2 g via INTRAVENOUS

## 2018-04-06 MED ORDER — SENNOSIDES 8.6 MG PO TAB
2 | Freq: Every evening | ORAL | 0 refills | Status: DC
Start: 2018-04-06 — End: 2018-05-05
  Administered 2018-04-07 – 2018-05-04 (×20): 2 via ORAL

## 2018-04-06 MED ORDER — DOCUSATE SODIUM 100 MG PO CAP
100 mg | Freq: Two times a day (BID) | ORAL | 0 refills | Status: DC
Start: 2018-04-06 — End: 2018-05-05
  Administered 2018-04-07 – 2018-05-04 (×46): 100 mg via ORAL

## 2018-04-06 MED ORDER — CARVEDILOL 25 MG PO TAB
25 mg | Freq: Two times a day (BID) | ORAL | 0 refills | Status: DC
Start: 2018-04-06 — End: 2018-05-05
  Administered 2018-04-07 – 2018-05-04 (×55): 25 mg via ORAL

## 2018-04-06 MED ORDER — OXYCODONE 5 MG PO TAB
5 mg | ORAL | 0 refills | 6.00000 days | Status: AC | PRN
Start: 2018-04-06 — End: 2018-05-04

## 2018-04-06 MED ORDER — CARVEDILOL 25 MG PO TAB
25 mg | ORAL_TABLET | Freq: Two times a day (BID) | ORAL | 3 refills | 90.00000 days | Status: AC
Start: 2018-04-06 — End: 2018-05-04

## 2018-04-06 MED ORDER — ARTIFICIAL SALIVA (YERBAS-LYT) MM SPRA
1 | OROMUCOSAL | 0 refills | Status: DC | PRN
Start: 2018-04-06 — End: 2018-05-05

## 2018-04-06 MED ORDER — CEFEPIME 2G/100ML NS IVPB (MB+)
2 g | Freq: Two times a day (BID) | INTRAVENOUS | 0 refills | Status: DC
Start: 2018-04-06 — End: 2018-04-06

## 2018-04-06 MED ORDER — INSULIN GLARGINE 100 UNIT/ML (3 ML) SC INJ PEN
20 [IU] | Freq: Every day | SUBCUTANEOUS | 0 refills | Status: DC
Start: 2018-04-06 — End: 2018-04-08
  Administered 2018-04-06: 19:00:00 20 [IU] via SUBCUTANEOUS

## 2018-04-06 MED ORDER — INSULIN GLARGINE 100 UNIT/ML (3 ML) SC INJ PEN
20 [IU] | Freq: Every day | SUBCUTANEOUS | 0 refills | Status: DC
Start: 2018-04-06 — End: 2018-04-06

## 2018-04-06 MED ORDER — INSULIN ASPART 100 UNIT/ML SC FLEXPEN
0-12 [IU] | Freq: Every day | SUBCUTANEOUS | 0 refills | Status: DC
Start: 2018-04-06 — End: 2018-04-26
  Administered 2018-04-06 – 2018-04-09 (×2): 2 [IU] via SUBCUTANEOUS
  Administered 2018-04-18: 17:00:00 4 [IU] via SUBCUTANEOUS

## 2018-04-06 MED ORDER — BISACODYL 10 MG RE SUPP
10 mg | Freq: Every day | RECTAL | 0 refills | Status: DC | PRN
Start: 2018-04-06 — End: 2018-05-05

## 2018-04-06 MED ORDER — LINEZOLID 600 MG PO TAB
600 mg | Freq: Two times a day (BID) | ORAL | 0 refills | Status: AC
Start: 2018-04-06 — End: ?
  Administered 2018-04-07 – 2018-04-10 (×6): 600 mg via ORAL

## 2018-04-06 MED ORDER — INSULIN GLARGINE 100 UNIT/ML (3 ML) SC INJ PEN
21 [IU] | Freq: Every day | SUBCUTANEOUS | 3 refills | 60.00000 days | Status: AC
Start: 2018-04-06 — End: 2018-05-04

## 2018-04-06 MED ORDER — ONDANSETRON HCL 4 MG PO TAB
4 mg | ORAL | 0 refills | Status: DC | PRN
Start: 2018-04-06 — End: 2018-04-08
  Administered 2018-04-08: 02:00:00 4 mg via ORAL

## 2018-04-06 MED ORDER — ALUMINUM-MAGNESIUM HYDROXIDE 200-200 MG/5 ML PO SUSP
30 mL | ORAL | 0 refills | Status: DC | PRN
Start: 2018-04-06 — End: 2018-05-05

## 2018-04-06 MED ORDER — HEPARIN, PORCINE (PF) 5,000 UNIT/0.5 ML IJ SYRG
5000 [IU] | SUBCUTANEOUS | 0 refills | Status: DC
Start: 2018-04-06 — End: 2018-05-05
  Administered 2018-04-06 – 2018-05-04 (×79): 5000 [IU] via SUBCUTANEOUS

## 2018-04-06 MED ORDER — FUROSEMIDE 40 MG PO TAB
40 mg | Freq: Every day | ORAL | 0 refills | Status: DC
Start: 2018-04-06 — End: 2018-04-08
  Administered 2018-04-07 – 2018-04-08 (×2): 40 mg via ORAL

## 2018-04-06 MED ORDER — CEFEPIME 2G/100ML NS IVPB (MB+)
2 g | Freq: Two times a day (BID) | INTRAVENOUS | 0 refills | 7.00000 days | Status: AC
Start: 2018-04-06 — End: 2018-05-04

## 2018-04-06 MED ORDER — INSULIN ASPART 100 UNIT/ML SC FLEXPEN
0-12 [IU] | Freq: Every day | SUBCUTANEOUS | 3 refills | 42.00000 days | Status: AC
Start: 2018-04-06 — End: 2018-05-04

## 2018-04-07 LAB — POC GLUCOSE
Lab: 38 mg/dL — CL (ref 70–100)
Lab: 74 mg/dL (ref 70–100)
Lab: 80 mg/dL (ref 70–100)
Lab: 119 mg/dL — ABNORMAL HIGH (ref 70–100)
Lab: 204 mg/dL — ABNORMAL HIGH (ref 70–100)
Lab: 340 mg/dL — ABNORMAL HIGH (ref 70–100)
Lab: 340 mg/dL — ABNORMAL HIGH (ref 98–107)
Lab: 184 mg/dL — ABNORMAL HIGH (ref 70–100)

## 2018-04-07 LAB — BASIC METABOLIC PANEL CELLULAR THERAPEUTICS: Lab: 138 MMOL/L — ABNORMAL LOW (ref 137–147)

## 2018-04-07 LAB — CBC CELLULAR THERAPEUTICS: Lab: 7.9 10*3/uL — ABNORMAL LOW (ref 4.5–11.0)

## 2018-04-07 MED ORDER — POLYETHYLENE GLYCOL 3350 17 GRAM PO PWPK
1 | Freq: Every day | ORAL | 0 refills | Status: DC
Start: 2018-04-07 — End: 2018-04-15
  Administered 2018-04-07 – 2018-04-15 (×9): 17 g via ORAL

## 2018-04-07 MED ORDER — ONDANSETRON 4 MG PO TBDI
4 mg | ORAL | 0 refills | Status: DC | PRN
Start: 2018-04-07 — End: 2018-05-05
  Administered 2018-04-09 – 2018-04-28 (×7): 4 mg via ORAL

## 2018-04-07 MED ORDER — SODIUM CHLORIDE 0.9 % FLUSH
20 mL | Freq: Three times a day (TID) | 0 refills | Status: DC
Start: 2018-04-07 — End: 2018-05-04

## 2018-04-07 MED ORDER — PROCHLORPERAZINE EDISYLATE 5 MG/ML IJ SOLN
10 mg | INTRAVENOUS | 0 refills | Status: CP | PRN
Start: 2018-04-07 — End: ?
  Administered 2018-04-08 – 2018-04-09 (×2): 10 mg via INTRAVENOUS

## 2018-04-08 LAB — POC GLUCOSE
Lab: 125 mg/dL — ABNORMAL HIGH (ref 70–100)
Lab: 114 mg/dL — ABNORMAL HIGH (ref 70–100)
Lab: 163 mg/dL — ABNORMAL HIGH (ref 70–100)
Lab: 342 mg/dL — ABNORMAL HIGH (ref 70–100)
Lab: 254 mg/dL — ABNORMAL HIGH (ref 70–100)

## 2018-04-08 LAB — BASIC METABOLIC PANEL CELLULAR THERAPEUTICS: Lab: 140 MMOL/L — ABNORMAL LOW (ref >60)

## 2018-04-08 LAB — CBC CELLULAR THERAPEUTICS: Lab: 7.4 K/UL — ABNORMAL LOW (ref >60)

## 2018-04-08 MED ORDER — BACITRACIN ZINC 500 UNIT/GRAM TP OINT
Freq: Two times a day (BID) | TOPICAL | 0 refills | Status: DC
Start: 2018-04-08 — End: 2018-05-05
  Administered 2018-04-08: 14:00:00 via TOPICAL
  Administered 2018-04-19: 13:00:00 14 g via TOPICAL
  Administered 2018-04-28 – 2018-05-04 (×2): via TOPICAL

## 2018-04-08 MED ORDER — ALTEPLASE 2 MG IK SOLR
2 mg | Freq: Once | INTRAMUSCULAR | 0 refills | Status: CP
Start: 2018-04-08 — End: ?
  Administered 2018-04-08: 16:00:00 2 mg via INTRAMUSCULAR

## 2018-04-08 MED ORDER — INSULIN GLARGINE 100 UNIT/ML (3 ML) SC INJ PEN
18 [IU] | Freq: Every day | SUBCUTANEOUS | 0 refills | Status: DC
Start: 2018-04-08 — End: 2018-04-14
  Administered 2018-04-08 – 2018-04-12 (×2): 18 [IU] via SUBCUTANEOUS

## 2018-04-09 LAB — POC GLUCOSE
Lab: 224 mg/dL — ABNORMAL HIGH (ref 70–100)
Lab: 125 mg/dL — ABNORMAL HIGH (ref 70–100)
Lab: 117 mg/dL — ABNORMAL HIGH (ref 70–100)
Lab: 302 mg/dL — ABNORMAL HIGH (ref 70–100)
Lab: 272 mg/dL — ABNORMAL HIGH (ref 70–100)
Lab: 180 mg/dL — ABNORMAL HIGH (ref 70–100)

## 2018-04-09 LAB — BASIC METABOLIC PANEL CELLULAR THERAPEUTICS: Lab: 138 MMOL/L — ABNORMAL LOW (ref 137–147)

## 2018-04-09 LAB — CBC CELLULAR THERAPEUTICS: Lab: 6.6 K/UL — ABNORMAL LOW (ref >60)

## 2018-04-10 LAB — CBC AND DIFF
Lab: 0.1 10*3/uL (ref 0–0.20)
Lab: 0.3 10*3/uL (ref 0–0.45)
Lab: 0.6 10*3/uL (ref 0–0.80)
Lab: 1 % (ref 0–2)
Lab: 1.5 10*3/uL (ref 1.0–4.8)
Lab: 13 % (ref 11–15)
Lab: 19 % — ABNORMAL LOW (ref 24–44)
Lab: 2.9 M/UL — ABNORMAL LOW (ref 4.0–5.0)
Lab: 25 % — ABNORMAL LOW (ref 36–45)
Lab: 262 10*3/uL (ref 150–400)
Lab: 27 pg — ABNORMAL LOW (ref >60)
Lab: 3 % (ref 0–5)
Lab: 32 g/dL — ABNORMAL LOW (ref >60)
Lab: 5.6 10*3/uL (ref 1.8–7.0)
Lab: 7 % (ref 4–12)
Lab: 7.6 FL (ref 7–11)
Lab: 70 % (ref 41–77)
Lab: 8 10*3/uL (ref 4.5–11.0)
Lab: 8.2 g/dL — ABNORMAL LOW (ref 12.0–15.0)
Lab: 85 FL (ref 80–100)

## 2018-04-10 LAB — POC GLUCOSE
Lab: 177 mg/dL — ABNORMAL HIGH (ref 70–100)
Lab: 124 mg/dL — ABNORMAL HIGH (ref 70–100)
Lab: 155 mg/dL — ABNORMAL HIGH (ref 70–100)
Lab: 339 mg/dL — ABNORMAL HIGH (ref 70–100)
Lab: 292 mg/dL — ABNORMAL HIGH (ref 70–100)
Lab: 196 mg/dL — ABNORMAL HIGH (ref 70–100)

## 2018-04-10 LAB — BASIC METABOLIC PANEL CELLULAR THERAPEUTICS: Lab: 144 MMOL/L — ABNORMAL HIGH (ref >60)

## 2018-04-11 LAB — POC GLUCOSE
Lab: 128 mg/dL — ABNORMAL HIGH (ref 70–100)
Lab: 80 mg/dL (ref 70–100)
Lab: 224 mg/dL — ABNORMAL HIGH (ref 70–100)
Lab: 324 mg/dL — ABNORMAL HIGH (ref 70–100)
Lab: 291 mg/dL — ABNORMAL HIGH (ref 70–100)

## 2018-04-11 LAB — CBC CELLULAR THERAPEUTICS: Lab: 6.6 K/UL — ABNORMAL LOW (ref >60)

## 2018-04-11 LAB — BASIC METABOLIC PANEL CELLULAR THERAPEUTICS: Lab: 140 MMOL/L — ABNORMAL LOW (ref >60)

## 2018-04-12 LAB — BASIC METABOLIC PANEL
Lab: 106 MMOL/L (ref 98–110)
Lab: 137 MMOL/L (ref 137–147)
Lab: 188 mg/dL — ABNORMAL HIGH (ref 70–100)
Lab: 3.3 mg/dL — ABNORMAL HIGH (ref 0.4–1.00)
Lab: 3.9 MMOL/L (ref 3.5–5.1)
Lab: 31 mg/dL — ABNORMAL HIGH (ref 7–25)
Lab: 8 (ref 3–12)

## 2018-04-12 LAB — POC GLUCOSE
Lab: 197 mg/dL — ABNORMAL HIGH (ref 70–100)
Lab: 157 mg/dL — ABNORMAL HIGH (ref 70–100)
Lab: 165 mg/dL — ABNORMAL HIGH (ref 70–100)
Lab: 269 mg/dL — ABNORMAL HIGH (ref 70–100)
Lab: 190 mg/dL — ABNORMAL HIGH (ref 70–100)

## 2018-04-12 MED ORDER — INSULIN ASPART 100 UNIT/ML SC FLEXPEN
1-25 [IU] | Freq: Three times a day (TID) | SUBCUTANEOUS | 0 refills | Status: DC
Start: 2018-04-12 — End: 2018-04-26

## 2018-04-13 LAB — POC GLUCOSE
Lab: 290 mg/dL — ABNORMAL HIGH (ref 70–100)
Lab: 291 mg/dL — ABNORMAL HIGH (ref 70–100)
Lab: 341 mg/dL — ABNORMAL HIGH (ref 70–100)
Lab: 272 mg/dL — ABNORMAL HIGH (ref 70–100)
Lab: 395 mg/dL — ABNORMAL HIGH (ref 70–100)
Lab: 165 mg/dL — ABNORMAL HIGH (ref 70–100)

## 2018-04-13 LAB — CBC CELLULAR THERAPEUTICS: Lab: 6.5 10*3/uL (ref 4.5–11.0)

## 2018-04-13 LAB — BASIC METABOLIC PANEL CELLULAR THERAPEUTICS: Lab: 137 MMOL/L — ABNORMAL LOW (ref 137–147)

## 2018-04-13 MED ORDER — IRON SUCROSE 300 MG IRON/15 ML IV SOLN
300 mg | INTRAVENOUS | 0 refills | Status: CP
Start: 2018-04-13 — End: ?
  Administered 2018-04-14 – 2018-04-18 (×6): 300 mg via INTRAVENOUS

## 2018-04-14 LAB — BASIC METABOLIC PANEL
Lab: 104 MMOL/L — ABNORMAL LOW (ref 98–110)
Lab: 134 MMOL/L — ABNORMAL LOW (ref 137–147)
Lab: 23 MMOL/L — ABNORMAL LOW (ref 21–30)
Lab: 261 mg/dL — ABNORMAL HIGH (ref 70–100)
Lab: 3.2 mg/dL — ABNORMAL HIGH (ref 0.4–1.00)
Lab: 30 mg/dL — ABNORMAL HIGH (ref 7–25)
Lab: 4.1 MMOL/L — ABNORMAL LOW (ref 3.5–5.1)
Lab: 9.1 mg/dL — ABNORMAL LOW (ref 8.5–10.6)

## 2018-04-14 LAB — POC GLUCOSE
Lab: 218 mg/dL — ABNORMAL HIGH (ref 70–100)
Lab: 207 mg/dL — ABNORMAL HIGH (ref 70–100)
Lab: 192 mg/dL — ABNORMAL HIGH (ref 70–100)
Lab: 227 mg/dL — ABNORMAL HIGH (ref 70–100)
Lab: 197 mg/dL — ABNORMAL HIGH (ref 70–100)
Lab: 161 mg/dL — ABNORMAL HIGH (ref 70–100)

## 2018-04-14 MED ORDER — INSULIN GLARGINE 100 UNIT/ML (3 ML) SC INJ PEN
20 [IU] | Freq: Every day | SUBCUTANEOUS | 0 refills | Status: DC
Start: 2018-04-14 — End: 2018-04-15

## 2018-04-14 MED ADMIN — SODIUM CHLORIDE 0.9 % IV SOLP [27838]: 100 mL | @ 21:00:00 | Stop: 2018-04-14 | NDC 00338004948

## 2018-04-15 LAB — CBC CELLULAR THERAPEUTICS: Lab: 6 K/UL — ABNORMAL LOW (ref 4.5–11.0)

## 2018-04-15 LAB — POC GLUCOSE
Lab: 103 mg/dL — ABNORMAL HIGH (ref 70–100)
Lab: 76 mg/dL (ref 70–100)
Lab: 103 mg/dL — ABNORMAL HIGH (ref 70–100)
Lab: 117 mg/dL — ABNORMAL HIGH (ref 70–100)
Lab: 133 mg/dL — ABNORMAL HIGH (ref 70–100)
Lab: 114 mg/dL — ABNORMAL HIGH (ref 70–100)
Lab: 103 mg/dL — ABNORMAL HIGH (ref 70–100)

## 2018-04-15 LAB — BASIC METABOLIC PANEL CELLULAR THERAPEUTICS: Lab: 135 MMOL/L — ABNORMAL LOW (ref >60)

## 2018-04-15 MED ORDER — FLUTICASONE PROPIONATE 50 MCG/ACTUATION NA SPSN
2 | Freq: Every day | NASAL | 0 refills | Status: DC
Start: 2018-04-15 — End: 2018-05-05
  Administered 2018-04-15: 14:00:00 2 via NASAL

## 2018-04-15 MED ORDER — INSULIN GLARGINE 100 UNIT/ML (3 ML) SC INJ PEN
18 [IU] | Freq: Every day | SUBCUTANEOUS | 0 refills | Status: DC
Start: 2018-04-15 — End: 2018-04-20

## 2018-04-15 MED ORDER — POLYETHYLENE GLYCOL 3350 17 GRAM PO PWPK
1 | Freq: Two times a day (BID) | ORAL | 0 refills | Status: DC
Start: 2018-04-15 — End: 2018-05-05
  Administered 2018-04-16 – 2018-05-04 (×20): 17 g via ORAL

## 2018-04-16 LAB — CBC
Lab: 13 % (ref 11–15)
Lab: 174 10*3/uL (ref 150–400)
Lab: 32 g/dL (ref 32.0–36.0)
Lab: 6.7 10*3/uL (ref 4.5–11.0)
Lab: 8.8 FL (ref 7–11)

## 2018-04-16 LAB — POC GLUCOSE
Lab: 119 mg/dL — ABNORMAL HIGH (ref 70–100)
Lab: 109 mg/dL — ABNORMAL HIGH (ref 70–100)
Lab: 100 mg/dL (ref 70–100)
Lab: 217 mg/dL — ABNORMAL HIGH (ref 70–100)
Lab: 200 mg/dL — ABNORMAL HIGH (ref 70–100)

## 2018-04-17 LAB — POC GLUCOSE
Lab: 255 mg/dL — ABNORMAL HIGH (ref 70–100)
Lab: 509 mg/dL — ABNORMAL HIGH (ref 70–100)
Lab: 446 mg/dL — ABNORMAL HIGH (ref 70–100)
Lab: 442 mg/dL — ABNORMAL HIGH (ref 70–100)
Lab: 373 mg/dL — ABNORMAL HIGH (ref 70–100)
Lab: 240 mg/dL — ABNORMAL HIGH (ref 70–100)
Lab: 156 mg/dL — ABNORMAL HIGH (ref 70–100)
Lab: 118 mg/dL — ABNORMAL HIGH (ref 70–100)

## 2018-04-17 LAB — CBC: Lab: 8.2 K/UL — ABNORMAL HIGH (ref 4.5–11.0)

## 2018-04-18 LAB — POC GLUCOSE
Lab: 98 mg/dL (ref 70–100)
Lab: 78 mg/dL (ref 70–100)
Lab: 86 mg/dL (ref 70–100)
Lab: 230 mg/dL — ABNORMAL HIGH (ref 70–100)
Lab: 223 mg/dL — ABNORMAL HIGH (ref 70–100)
Lab: 141 mg/dL — ABNORMAL HIGH (ref 70–100)

## 2018-04-18 LAB — CBC CELLULAR THERAPEUTICS: Lab: 6.2 K/UL (ref >60)

## 2018-04-18 LAB — BASIC METABOLIC PANEL CELLULAR THERAPEUTICS: Lab: 138 MMOL/L — ABNORMAL LOW (ref 137–147)

## 2018-04-18 MED ORDER — MIRTAZAPINE 15 MG PO TAB
15 mg | Freq: Every evening | ORAL | 0 refills | Status: DC
Start: 2018-04-18 — End: 2018-05-05
  Administered 2018-04-19 – 2018-05-04 (×16): 15 mg via ORAL

## 2018-04-18 MED ORDER — MAGNESIUM HYDROXIDE 2,400 MG/10 ML PO SUSP
10 mL | Freq: Every day | ORAL | 0 refills | Status: DC
Start: 2018-04-18 — End: 2018-05-05
  Administered 2018-04-21 – 2018-05-04 (×11): 10 mL via ORAL

## 2018-04-19 LAB — URINALYSIS MICROSCOPIC REFLEX TO CULTURE

## 2018-04-19 LAB — POC GLUCOSE
Lab: 156 mg/dL — ABNORMAL HIGH (ref 70–100)
Lab: 111 mg/dL — ABNORMAL HIGH (ref 70–100)
Lab: 85 mg/dL (ref 70–100)
Lab: 147 mg/dL — ABNORMAL HIGH (ref 70–100)
Lab: 193 mg/dL — ABNORMAL HIGH (ref 70–100)
Lab: 173 mg/dL — ABNORMAL HIGH (ref 70–100)

## 2018-04-19 LAB — URINALYSIS DIPSTICK REFLEX TO CULTURE
Lab: NEGATIVE
Lab: NEGATIVE
Lab: NEGATIVE

## 2018-04-19 MED ORDER — SODIUM CHLORIDE 0.9 % IV SOLP
1000 mL | Freq: Once | INTRAVENOUS | 0 refills | Status: CP
Start: 2018-04-19 — End: ?
  Administered 2018-04-19: 21:00:00 1000 mL via INTRAVENOUS

## 2018-04-19 MED ORDER — CEFTRIAXONE INJ 1GM IVP
1 g | INTRAVENOUS | 0 refills | Status: DC
Start: 2018-04-19 — End: 2018-04-21
  Administered 2018-04-19 – 2018-04-21 (×3): 1 g via INTRAVENOUS

## 2018-04-20 LAB — CBC AND DIFF
Lab: 0.1 10*3/uL (ref 0–0.20)
Lab: 0.6 10*3/uL (ref 0–0.80)
Lab: 1 % (ref 0–2)
Lab: 1.4 10*3/uL (ref 1.0–4.8)
Lab: 10 FL — ABNORMAL LOW (ref >60)
Lab: 14 % (ref 11–15)
Lab: 16 % — ABNORMAL LOW (ref 24–44)
Lab: 2.7 M/UL — ABNORMAL LOW (ref 4.0–5.0)
Lab: 208 K/UL — ABNORMAL LOW (ref >60)
Lab: 28 pg — ABNORMAL HIGH (ref 26–34)
Lab: 33 g/dL — ABNORMAL HIGH (ref 32.0–36.0)
Lab: 4 % (ref 0–5)
Lab: 6.1 10*3/uL (ref 1.8–7.0)
Lab: 7 % (ref 4–12)
Lab: 7.7 g/dL — ABNORMAL LOW (ref 12.0–15.0)
Lab: 72 % (ref 41–77)
Lab: 8.5 K/UL (ref 4.5–11.0)
Lab: 85 FL (ref 80–100)

## 2018-04-20 LAB — BASIC METABOLIC PANEL
Lab: 138 MMOL/L (ref 137–147)
Lab: 7 % — ABNORMAL LOW (ref 3–12)

## 2018-04-20 LAB — POC GLUCOSE
Lab: 111 mg/dL — ABNORMAL HIGH (ref 70–100)
Lab: 106 mg/dL — ABNORMAL HIGH (ref 70–100)
Lab: 100 mg/dL — ABNORMAL LOW (ref 70–100)
Lab: 148 mg/dL — ABNORMAL HIGH (ref 70–100)
Lab: 142 mg/dL — ABNORMAL HIGH (ref 70–100)

## 2018-04-20 MED ORDER — INSULIN GLARGINE 100 UNIT/ML (3 ML) SC INJ PEN
17 [IU] | Freq: Every day | SUBCUTANEOUS | 0 refills | Status: DC
Start: 2018-04-20 — End: 2018-04-25
  Administered 2018-04-23: 18:00:00 17 [IU] via SUBCUTANEOUS

## 2018-04-21 LAB — POC GLUCOSE
Lab: 162 mg/dL — ABNORMAL HIGH (ref 70–100)
Lab: 174 mg/dL — ABNORMAL HIGH (ref 70–100)
Lab: 158 mg/dL — ABNORMAL HIGH (ref 70–100)
Lab: 232 mg/dL — ABNORMAL HIGH (ref 70–100)
Lab: 234 mg/dL — ABNORMAL HIGH (ref 70–100)
Lab: 202 mg/dL — ABNORMAL HIGH (ref 70–100)

## 2018-04-21 LAB — CULTURE-URINE W/SENSITIVITY
Lab: >10
Lab: >10 — AB

## 2018-04-21 MED ORDER — ERTAPENEM IVPB
500 mg | INTRAVENOUS | 0 refills | Status: CP
Start: 2018-04-21 — End: ?
  Administered 2018-04-21 – 2018-04-27 (×14): 500 mg via INTRAVENOUS

## 2018-04-22 ENCOUNTER — Inpatient Hospital Stay: Admit: 2018-04-22 | Discharge: 2018-04-22 | Payer: Private Health Insurance - Indemnity

## 2018-04-22 LAB — POC GLUCOSE
Lab: 148 mg/dL — ABNORMAL HIGH (ref >60)
Lab: 150 mg/dL — ABNORMAL HIGH (ref 70–100)
Lab: 174 mg/dL — ABNORMAL HIGH (ref 70–100)
Lab: 219 mg/dL — ABNORMAL HIGH (ref 70–100)
Lab: 223 mg/dL — ABNORMAL HIGH (ref 70–100)
Lab: 228 mg/dL — ABNORMAL HIGH (ref 70–100)
Lab: 236 mg/dL — ABNORMAL HIGH (ref 70–100)

## 2018-04-22 LAB — BASIC METABOLIC PANEL CELLULAR THERAPEUTICS: Lab: 138 MMOL/L — ABNORMAL LOW (ref >60)

## 2018-04-22 MED ORDER — HYDRALAZINE 50 MG PO TAB
100 mg | Freq: Two times a day (BID) | ORAL | 0 refills | Status: DC
Start: 2018-04-22 — End: 2018-04-25
  Administered 2018-04-23 – 2018-04-25 (×6): 100 mg via ORAL

## 2018-04-23 LAB — POC GLUCOSE
Lab: 141 mg/dL — ABNORMAL HIGH (ref 70–100)
Lab: 58 mg/dL — ABNORMAL LOW (ref 70–100)
Lab: 106 mg/dL — ABNORMAL HIGH (ref 70–100)
Lab: 139 mg/dL — ABNORMAL HIGH (ref 70–100)
Lab: 102 mg/dL — ABNORMAL HIGH (ref 70–100)
Lab: 127 mg/dL — ABNORMAL HIGH (ref 70–100)

## 2018-04-24 LAB — POC GLUCOSE
Lab: 129 mg/dL — ABNORMAL HIGH (ref 70–100)
Lab: 90 mg/dL (ref 70–100)
Lab: 202 mg/dL — ABNORMAL HIGH (ref 70–100)
Lab: 392 mg/dL — ABNORMAL HIGH (ref 70–100)
Lab: 160 mg/dL — ABNORMAL HIGH (ref 70–100)

## 2018-04-24 LAB — CBC AND DIFF: Lab: 4.3 K/UL — ABNORMAL LOW (ref 4.5–11.0)

## 2018-04-24 LAB — BASIC METABOLIC PANEL: Lab: 138 MMOL/L (ref 137–147)

## 2018-04-25 LAB — CULTURE-FUNGAL,CSF

## 2018-04-25 LAB — POC GLUCOSE
Lab: 58 mg/dL — ABNORMAL LOW (ref 70–100)
Lab: 96 mg/dL (ref 70–100)
Lab: 185 mg/dL — ABNORMAL HIGH (ref 70–100)
Lab: 160 mg/dL — ABNORMAL HIGH (ref 70–100)
Lab: 296 mg/dL — ABNORMAL HIGH (ref 70–100)
Lab: 97 mg/dL (ref 70–100)

## 2018-04-25 LAB — CBC CELLULAR THERAPEUTICS: Lab: 5 K/UL — ABNORMAL LOW (ref >60)

## 2018-04-25 LAB — BASIC METABOLIC PANEL CELLULAR THERAPEUTICS: Lab: 138 MMOL/L — ABNORMAL LOW (ref >60)

## 2018-04-25 MED ORDER — LIDOCAINE HCL 2 % MM JELP
TOPICAL | 0 refills | Status: DC | PRN
Start: 2018-04-25 — End: 2018-05-05
  Administered 2018-04-25: 21:00:00 20.000 mL via TOPICAL

## 2018-04-25 MED ORDER — SODIUM POLYSTYRENE SULFONATE 15 GRAM/60 ML PO SUSP
15 g | Freq: Once | ORAL | 0 refills | Status: CP
Start: 2018-04-25 — End: ?
  Administered 2018-04-25: 18:00:00 15 g via ORAL

## 2018-04-25 MED ORDER — FUROSEMIDE 20 MG PO TAB
20 mg | Freq: Once | ORAL | 0 refills | Status: CP
Start: 2018-04-25 — End: ?
  Administered 2018-04-25: 21:00:00 20 mg via ORAL

## 2018-04-25 MED ORDER — TAMSULOSIN 0.4 MG PO CAP
0.4 mg | Freq: Every day | ORAL | 0 refills | Status: DC
Start: 2018-04-25 — End: 2018-05-05
  Administered 2018-04-25 – 2018-05-04 (×10): 0.4 mg via ORAL

## 2018-04-25 MED ORDER — FUROSEMIDE 10 MG/ML PO SOLN
20 mg | Freq: Once | ORAL | 0 refills | Status: DC
Start: 2018-04-25 — End: 2018-04-25

## 2018-04-25 MED ORDER — INSULIN GLARGINE 100 UNIT/ML (3 ML) SC INJ PEN
15 [IU] | Freq: Every day | SUBCUTANEOUS | 0 refills | Status: DC
Start: 2018-04-25 — End: 2018-04-26

## 2018-04-25 MED ORDER — HYDRALAZINE 50 MG PO TAB
100 mg | Freq: Every day | ORAL | 0 refills | Status: DC
Start: 2018-04-25 — End: 2018-04-27
  Administered 2018-04-26 – 2018-04-27 (×2): 100 mg via ORAL

## 2018-04-26 LAB — BASIC METABOLIC PANEL
Lab: 139 MMOL/L — ABNORMAL HIGH (ref 137–147)
Lab: 5.4 MMOL/L — ABNORMAL HIGH (ref 3.5–5.1)
Lab: 136 MMOL/L — ABNORMAL LOW (ref >60)
Lab: 24 mL/min — ABNORMAL LOW (ref >60)

## 2018-04-26 LAB — CBC AND DIFF
Lab: 2.9 M/UL — ABNORMAL LOW (ref 4.0–5.0)
Lab: 26 % — ABNORMAL LOW (ref 36–45)
Lab: 28 pg — ABNORMAL HIGH (ref 26–34)
Lab: 4.8 K/UL — ABNORMAL HIGH (ref 4.5–11.0)
Lab: 8.4 g/dL — ABNORMAL LOW (ref 12.0–15.0)
Lab: 87 FL — ABNORMAL HIGH (ref 80–100)

## 2018-04-26 LAB — POC GLUCOSE
Lab: 61 mg/dL — ABNORMAL LOW (ref 70–100)
Lab: 75 mg/dL (ref 70–100)
Lab: 59 mg/dL — ABNORMAL LOW (ref 70–100)
Lab: 85 mg/dL (ref 70–100)
Lab: 106 mg/dL — ABNORMAL HIGH (ref 70–100)
Lab: 183 mg/dL — ABNORMAL HIGH (ref 70–100)
Lab: 209 mg/dL — ABNORMAL HIGH (ref 70–100)

## 2018-04-26 MED ORDER — FUROSEMIDE 40 MG PO TAB
40 mg | Freq: Once | ORAL | 0 refills | Status: CP
Start: 2018-04-26 — End: ?
  Administered 2018-04-26: 19:00:00 40 mg via ORAL

## 2018-04-26 MED ORDER — HYDRALAZINE 100 MG PO TAB
100 mg | ORAL_TABLET | Freq: Every day | ORAL | 1 refills | Status: CN
Start: 2018-04-26 — End: ?

## 2018-04-26 MED ORDER — INSULIN PUMP - LISPRO
Freq: Three times a day (TID) | SUBCUTANEOUS | 0 refills | Status: DC | PRN
Start: 2018-04-26 — End: 2018-04-30

## 2018-04-27 LAB — POC GLUCOSE
Lab: 132 mg/dL — ABNORMAL HIGH (ref 70–100)
Lab: 131 mg/dL — ABNORMAL HIGH (ref 70–100)
Lab: 104 mg/dL — ABNORMAL HIGH (ref 70–100)
Lab: 87 mg/dL (ref 70–100)
Lab: 160 mg/dL — ABNORMAL HIGH (ref 70–100)

## 2018-04-27 LAB — BASIC METABOLIC PANEL CELLULAR THERAPEUTICS: Lab: 141 MMOL/L — ABNORMAL LOW (ref 137–147)

## 2018-04-27 LAB — CBC CELLULAR THERAPEUTICS: Lab: 5 K/UL — ABNORMAL LOW (ref 4.5–11.0)

## 2018-04-28 LAB — POC GLUCOSE
Lab: 110 mg/dL — ABNORMAL HIGH (ref 70–100)
Lab: 126 mg/dL — ABNORMAL HIGH (ref 70–100)
Lab: 135 mg/dL — ABNORMAL HIGH (ref 70–100)
Lab: 154 mg/dL — ABNORMAL HIGH (ref 70–100)
Lab: 198 mg/dL — ABNORMAL HIGH (ref 70–100)
Lab: 62 mg/dL — ABNORMAL LOW (ref 70–100)
Lab: 64 mg/dL — ABNORMAL LOW (ref 70–100)
Lab: 66 mg/dL — ABNORMAL LOW (ref 70–100)
Lab: 76 mg/dL (ref 70–100)
Lab: 79 mg/dL (ref 70–100)
Lab: 91 mg/dL (ref 70–100)

## 2018-04-29 LAB — BASIC METABOLIC PANEL CELLULAR THERAPEUTICS: Lab: 142 MMOL/L — ABNORMAL LOW (ref >60)

## 2018-04-29 LAB — POC GLUCOSE
Lab: 178 mg/dL — ABNORMAL HIGH (ref 70–100)
Lab: 107 mg/dL — ABNORMAL HIGH (ref 70–100)
Lab: 93 mg/dL (ref 70–100)
Lab: 49 mg/dL — CL (ref 70–100)
Lab: 50 mg/dL — ABNORMAL LOW (ref >60)
Lab: 65 mg/dL — ABNORMAL LOW (ref 70–100)
Lab: 71 mg/dL — ABNORMAL LOW (ref 70–100)
Lab: 98 mg/dL (ref 70–100)
Lab: 150 mg/dL — ABNORMAL HIGH (ref 70–100)
Lab: 187 mg/dL — ABNORMAL HIGH (ref 70–100)
Lab: 236 mg/dL — ABNORMAL HIGH (ref 70–100)
Lab: 140 mg/dL — ABNORMAL HIGH (ref 70–100)

## 2018-04-29 LAB — CBC AND DIFF
Lab: 0.1 10*3/uL (ref 0–0.20)
Lab: 0.3 10*3/uL (ref 0–0.80)
Lab: 0.6 10*3/uL — ABNORMAL HIGH (ref 0–0.45)
Lab: 1 % — ABNORMAL LOW (ref 0–2)
Lab: 1.5 10*3/uL (ref 1.0–4.8)
Lab: 12 % — ABNORMAL HIGH (ref 0–5)
Lab: 17 % — ABNORMAL HIGH (ref >60)
Lab: 2.9 10*3/uL (ref 1.8–7.0)
Lab: 26 % — ABNORMAL LOW (ref 36–45)
Lab: 28 % (ref 24–44)
Lab: 29 pg — ABNORMAL HIGH (ref 26–34)
Lab: 5.3 K/UL (ref 4.5–11.0)
Lab: 53 % (ref 41–77)
Lab: 8.6 g/dL — ABNORMAL LOW (ref 12.0–15.0)
Lab: 88 FL — ABNORMAL HIGH (ref 80–100)
Lab: 9.5 FL — ABNORMAL HIGH (ref 7–11)

## 2018-04-29 LAB — BASIC METABOLIC PANEL
Lab: 136 MMOL/L — ABNORMAL LOW (ref 137–147)
Lab: 22 mL/min — ABNORMAL LOW (ref >60)
Lab: 6.2 MMOL/L — ABNORMAL HIGH (ref 3.5–5.1)
Lab: 9.7 mg/dL — ABNORMAL HIGH (ref <100)

## 2018-04-29 LAB — CBC CELLULAR THERAPEUTICS: Lab: 5 K/UL — ABNORMAL LOW (ref >60)

## 2018-04-29 MED ORDER — SODIUM POLYSTYRENE SULFONATE 15 GRAM/60 ML PO SUSP
15 g | Freq: Once | ORAL | 0 refills | Status: CP
Start: 2018-04-29 — End: ?
  Administered 2018-04-29: 17:00:00 15 g via ORAL

## 2018-04-29 MED ORDER — FUROSEMIDE 40 MG PO TAB
40 mg | Freq: Once | ORAL | 0 refills | Status: CP
Start: 2018-04-29 — End: ?
  Administered 2018-04-29: 21:00:00 40 mg via ORAL

## 2018-04-30 LAB — POC GLUCOSE
Lab: 69 mg/dL — ABNORMAL LOW (ref 70–100)
Lab: 113 mg/dL — ABNORMAL HIGH (ref 70–100)
Lab: 87 mg/dL (ref 70–100)
Lab: 75 mg/dL (ref 70–100)
Lab: 50 mg/dL — ABNORMAL LOW (ref 70–100)
Lab: 62 mg/dL — ABNORMAL LOW (ref 70–100)
Lab: 126 mg/dL — ABNORMAL HIGH (ref 70–100)
Lab: 217 mg/dL — ABNORMAL HIGH (ref 70–100)

## 2018-04-30 LAB — CBC AND DIFF: Lab: 5.3 10*3/uL — ABNORMAL HIGH (ref 4.5–11.0)

## 2018-04-30 LAB — BASIC METABOLIC PANEL: Lab: 138 MMOL/L (ref 137–147)

## 2018-04-30 MED ORDER — SODIUM CHLORIDE 0.9 % IV SOLP
500 mL | Freq: Once | INTRAVENOUS | 0 refills | Status: CP
Start: 2018-04-30 — End: ?
  Administered 2018-04-30: 19:00:00 500 mL via INTRAVENOUS

## 2018-04-30 MED ORDER — FUROSEMIDE 40 MG PO TAB
40 mg | Freq: Once | ORAL | 0 refills | Status: CP
Start: 2018-04-30 — End: ?
  Administered 2018-04-30: 23:00:00 40 mg via ORAL

## 2018-04-30 MED ORDER — INSULIN ASPART 100 UNIT/ML SC FLEXPEN
0-6 [IU] | Freq: Every day | SUBCUTANEOUS | 0 refills | Status: DC
Start: 2018-04-30 — End: 2018-05-05

## 2018-04-30 MED ORDER — INSULIN ASPART 100 UNIT/ML SC FLEXPEN
0-6 [IU] | Freq: Before meals | SUBCUTANEOUS | 0 refills | Status: DC
Start: 2018-04-30 — End: 2018-04-30

## 2018-04-30 MED ORDER — INSULIN GLARGINE 100 UNIT/ML (3 ML) SC INJ PEN
20 [IU] | Freq: Every day | SUBCUTANEOUS | 0 refills | Status: DC
Start: 2018-04-30 — End: 2018-05-01

## 2018-04-30 MED ORDER — INSULIN ASPART 100 UNIT/ML SC FLEXPEN
1-25 [IU] | Freq: Three times a day (TID) | SUBCUTANEOUS | 0 refills | Status: DC
Start: 2018-04-30 — End: 2018-05-05
  Administered 2018-04-30: 23:00:00 2 [IU] via SUBCUTANEOUS

## 2018-05-01 LAB — POC GLUCOSE
Lab: 199 mg/dL — ABNORMAL HIGH (ref 70–100)
Lab: 85 mg/dL — ABNORMAL HIGH (ref 70–100)
Lab: 48 mg/dL — CL (ref 70–100)
Lab: 63 mg/dL — ABNORMAL LOW (ref 70–100)
Lab: 78 mg/dL (ref 70–100)
Lab: 165 mg/dL — ABNORMAL HIGH (ref 70–100)
Lab: 395 mg/dL — ABNORMAL HIGH (ref 70–100)
Lab: 399 mg/dL — ABNORMAL HIGH (ref 70–100)
Lab: 385 mg/dL — ABNORMAL HIGH (ref 70–100)
Lab: 357 mg/dL — ABNORMAL HIGH (ref 70–100)
Lab: 293 mg/dL — ABNORMAL HIGH (ref 70–100)
Lab: 185 mg/dL — ABNORMAL HIGH (ref 70–100)

## 2018-05-01 LAB — BASIC METABOLIC PANEL
Lab: 10 (ref 3–12)
Lab: 136 MMOL/L — ABNORMAL LOW (ref 137–147)
Lab: 18 mL/min — ABNORMAL LOW (ref >60)
Lab: 2.8 mg/dL — ABNORMAL HIGH (ref 0.4–1.00)
Lab: 22 mL/min — ABNORMAL LOW (ref >60)
Lab: 26 MMOL/L (ref 21–30)
Lab: 402 mg/dL — ABNORMAL HIGH (ref 70–100)
Lab: 6.1 MMOL/L — ABNORMAL HIGH (ref 3.5–5.1)
Lab: 9.7 mg/dL (ref 8.5–10.6)

## 2018-05-01 MED ORDER — FUROSEMIDE 40 MG PO TAB
40 mg | Freq: Once | ORAL | 0 refills | Status: CP
Start: 2018-05-01 — End: ?
  Administered 2018-05-01: 23:00:00 40 mg via ORAL

## 2018-05-01 MED ORDER — SODIUM CHLORIDE 0.9 % IV SOLP
500 mL | INTRAVENOUS | 0 refills | Status: AC
Start: 2018-05-01 — End: ?
  Administered 2018-05-01 (×2): 500 mL via INTRAVENOUS

## 2018-05-01 MED ORDER — INSULIN GLARGINE 100 UNIT/ML (3 ML) SC INJ PEN
15 [IU] | Freq: Every day | SUBCUTANEOUS | 0 refills | Status: DC
Start: 2018-05-01 — End: 2018-05-02

## 2018-05-02 LAB — BASIC METABOLIC PANEL
Lab: 103 MMOL/L (ref 98–110)
Lab: 138 MMOL/L (ref 137–147)
Lab: 18 mL/min — ABNORMAL LOW (ref >60)
Lab: 2.9 mg/dL — ABNORMAL HIGH (ref 0.4–1.00)
Lab: 22 mL/min — ABNORMAL LOW (ref >60)
Lab: 27 MMOL/L (ref 21–30)
Lab: 5.2 MMOL/L — ABNORMAL HIGH (ref 3.5–5.1)
Lab: 50 mg/dL — ABNORMAL HIGH (ref 7–25)
Lab: 8 (ref 3–12)
Lab: 9.2 mg/dL (ref 8.5–10.6)
Lab: 98 mg/dL (ref 70–100)

## 2018-05-02 LAB — POC GLUCOSE
Lab: 101 mg/dL — ABNORMAL HIGH (ref 70–100)
Lab: 111 mg/dL — ABNORMAL HIGH (ref 70–100)
Lab: 166 mg/dL — ABNORMAL HIGH (ref 70–100)
Lab: 218 mg/dL — ABNORMAL HIGH (ref 70–100)
Lab: 263 mg/dL — ABNORMAL HIGH (ref 70–100)
Lab: 265 mg/dL — ABNORMAL HIGH (ref 70–100)
Lab: 361 mg/dL — ABNORMAL HIGH (ref 70–100)
Lab: 58 mg/dL — ABNORMAL LOW (ref 70–100)
Lab: 80 mg/dL (ref 70–100)
Lab: 81 mg/dL (ref 70–100)
Lab: 86 mg/dL (ref 70–100)
Lab: 90 mg/dL (ref 70–100)

## 2018-05-02 LAB — CBC CELLULAR THERAPEUTICS
Lab: 2.8 M/UL — ABNORMAL LOW (ref >60)
Lab: 5.1 K/UL — ABNORMAL HIGH (ref >60)

## 2018-05-02 MED ORDER — INSULIN GLARGINE 100 UNIT/ML (3 ML) SC INJ PEN
12 [IU] | Freq: Every day | SUBCUTANEOUS | 0 refills | Status: DC
Start: 2018-05-02 — End: 2018-05-03

## 2018-05-03 LAB — POC GLUCOSE
Lab: 211 mg/dL — ABNORMAL HIGH (ref 70–100)
Lab: 261 mg/dL — ABNORMAL HIGH (ref 70–100)
Lab: 262 mg/dL — ABNORMAL HIGH (ref 70–100)
Lab: 309 mg/dL — ABNORMAL HIGH (ref 70–100)
Lab: 391 mg/dL — ABNORMAL HIGH (ref 70–100)
Lab: 398 mg/dL — ABNORMAL HIGH (ref 70–100)
Lab: 43 mg/dL — CL (ref 70–100)
Lab: 77 mg/dL (ref 70–100)
Lab: 94 mg/dL (ref 70–100)

## 2018-05-03 LAB — BASIC METABOLIC PANEL
Lab: 10 (ref 3–12)
Lab: 134 MMOL/L — ABNORMAL LOW (ref 137–147)
Lab: 18 mL/min — ABNORMAL LOW (ref >60)
Lab: 2.8 mg/dL — ABNORMAL HIGH (ref 0.4–1.00)
Lab: 22 mL/min — ABNORMAL LOW (ref >60)
Lab: 25 MMOL/L (ref 21–30)
Lab: 409 mg/dL — ABNORMAL HIGH (ref 70–100)
Lab: 5.8 MMOL/L — ABNORMAL HIGH (ref 3.5–5.1)
Lab: 52 mg/dL — ABNORMAL HIGH (ref 7–25)
Lab: 9.5 mg/dL (ref 8.5–10.6)
Lab: 99 MMOL/L (ref 98–110)

## 2018-05-03 MED ORDER — POLYETHYLENE GLYCOL 3350 17 GRAM PO PWPK
17 g | Freq: Two times a day (BID) | ORAL | 0 refills | 18.00000 days | Status: DC
Start: 2018-05-03 — End: 2018-05-04

## 2018-05-03 MED ORDER — ACETAMINOPHEN 325 MG PO TAB
650 mg | ORAL | 0 refills | Status: DC | PRN
Start: 2018-05-03 — End: 2018-05-04

## 2018-05-03 MED ORDER — CLONIDINE HCL 0.2 MG PO TAB
.2 mg | ORAL_TABLET | Freq: Three times a day (TID) | ORAL | 1 refills | Status: AC
Start: 2018-05-03 — End: 2018-05-04

## 2018-05-03 MED ORDER — INSULIN ASPART 100 UNIT/ML SC FLEXPEN
1-25 [IU] | Freq: Three times a day (TID) | SUBCUTANEOUS | 1 refills | 42.00000 days | Status: AC
Start: 2018-05-03 — End: 2018-05-04

## 2018-05-03 MED ORDER — PANTOPRAZOLE 40 MG PO TBEC
40 mg | ORAL_TABLET | Freq: Every day | ORAL | 1 refills | 90.00000 days | Status: AC
Start: 2018-05-03 — End: 2018-05-04

## 2018-05-03 MED ORDER — INSULIN GLARGINE 100 UNIT/ML (3 ML) SC INJ PEN
12 [IU] | Freq: Every day | SUBCUTANEOUS | 1 refills | Status: CN
Start: 2018-05-03 — End: ?

## 2018-05-03 MED ORDER — FUROSEMIDE 40 MG PO TAB
40 mg | Freq: Every day | ORAL | 0 refills | Status: DC
Start: 2018-05-03 — End: 2018-05-05
  Administered 2018-05-03 – 2018-05-04 (×2): 40 mg via ORAL

## 2018-05-03 MED ORDER — MIRTAZAPINE 15 MG PO TAB
15 mg | ORAL_TABLET | Freq: Every evening | ORAL | 1 refills | Status: AC
Start: 2018-05-03 — End: 2018-05-04
  Filled 2018-05-04 (×2): qty 30, 30d supply, fill #1

## 2018-05-03 MED ORDER — PEN NEEDLE, DIABETIC 32 GAUGE X 5/32" MISC NDLE
1 | Freq: Before meals | 1 refills | 30.00000 days | Status: AC
Start: 2018-05-03 — End: 2018-05-04
  Filled 2018-05-04 (×2): qty 100, 25d supply, fill #1

## 2018-05-03 MED ORDER — INSULIN GLARGINE 100 UNIT/ML (3 ML) SC INJ PEN
10 [IU] | Freq: Every day | SUBCUTANEOUS | 0 refills | Status: DC
Start: 2018-05-03 — End: 2018-05-05

## 2018-05-03 MED ORDER — INSULIN GLARGINE 100 UNIT/ML (3 ML) SC INJ PEN
12 [IU] | Freq: Every day | SUBCUTANEOUS | 3 refills | 60.00000 days | Status: AC
Start: 2018-05-03 — End: 2018-05-04

## 2018-05-03 MED ORDER — AMLODIPINE 10 MG PO TAB
10 mg | ORAL_TABLET | Freq: Every day | ORAL | 1 refills | Status: AC
Start: 2018-05-03 — End: 2018-05-04
  Filled 2018-05-04 (×2): qty 30, 30d supply, fill #1

## 2018-05-03 MED ORDER — CARVEDILOL 25 MG PO TAB
25 mg | ORAL_TABLET | Freq: Two times a day (BID) | ORAL | 1 refills | 90.00000 days | Status: AC
Start: 2018-05-03 — End: 2018-05-04

## 2018-05-03 MED ORDER — INSULIN GLARGINE 100 UNIT/ML (3 ML) SC INJ PEN
10 [IU] | Freq: Every day | SUBCUTANEOUS | 3 refills | 60.00000 days | Status: AC
Start: 2018-05-03 — End: 2018-05-04

## 2018-05-03 MED ORDER — INSULIN ASPART 100 UNIT/ML SC FLEXPEN
1-25 [IU] | Freq: Three times a day (TID) | SUBCUTANEOUS | 1 refills | 30.00000 days | Status: AC
Start: 2018-05-03 — End: 2018-05-04

## 2018-05-03 MED ORDER — TAMSULOSIN 0.4 MG PO CAP
0.4 mg | ORAL_CAPSULE | Freq: Every day | ORAL | 1 refills | 90.00000 days | Status: AC
Start: 2018-05-03 — End: 2018-05-04
  Filled 2018-05-04 (×2): qty 30, 30d supply, fill #1

## 2018-05-03 MED ORDER — FUROSEMIDE 40 MG PO TAB
40 mg | ORAL_TABLET | Freq: Every day | ORAL | 0 refills | 90.00000 days | Status: AC
Start: 2018-05-03 — End: 2018-05-04
  Filled 2018-05-04 (×2): qty 90, 30d supply, fill #1

## 2018-05-04 ENCOUNTER — Encounter: Admit: 2018-05-04 | Discharge: 2018-05-04 | Payer: Private Health Insurance - Indemnity

## 2018-05-04 ENCOUNTER — Inpatient Hospital Stay: Admit: 2018-04-06 | Discharge: 2018-05-04 | Payer: Private Health Insurance - Indemnity

## 2018-05-04 DIAGNOSIS — I69298 Other sequelae of other nontraumatic intracranial hemorrhage: ICD-10-CM

## 2018-05-04 DIAGNOSIS — N390 Urinary tract infection, site not specified: ICD-10-CM

## 2018-05-04 DIAGNOSIS — E10649 Type 1 diabetes mellitus with hypoglycemia without coma: ICD-10-CM

## 2018-05-04 DIAGNOSIS — K592 Neurogenic bowel, not elsewhere classified: ICD-10-CM

## 2018-05-04 DIAGNOSIS — R2689 Other abnormalities of gait and mobility: ICD-10-CM

## 2018-05-04 DIAGNOSIS — Z98891 History of uterine scar from previous surgery: ICD-10-CM

## 2018-05-04 DIAGNOSIS — Z794 Long term (current) use of insulin: ICD-10-CM

## 2018-05-04 DIAGNOSIS — I69193 Ataxia following nontraumatic intracerebral hemorrhage: ICD-10-CM

## 2018-05-04 DIAGNOSIS — H4312 Vitreous hemorrhage, left eye: ICD-10-CM

## 2018-05-04 DIAGNOSIS — Z9641 Presence of insulin pump (external) (internal): ICD-10-CM

## 2018-05-04 DIAGNOSIS — R4586 Emotional lability: ICD-10-CM

## 2018-05-04 DIAGNOSIS — F4321 Adjustment disorder with depressed mood: ICD-10-CM

## 2018-05-04 DIAGNOSIS — E875 Hyperkalemia: ICD-10-CM

## 2018-05-04 DIAGNOSIS — E1042 Type 1 diabetes mellitus with diabetic polyneuropathy: ICD-10-CM

## 2018-05-04 DIAGNOSIS — M549 Dorsalgia, unspecified: ICD-10-CM

## 2018-05-04 DIAGNOSIS — N319 Neuromuscular dysfunction of bladder, unspecified: ICD-10-CM

## 2018-05-04 DIAGNOSIS — Z1635 Resistance to multiple antimicrobial drugs: ICD-10-CM

## 2018-05-04 DIAGNOSIS — E1065 Type 1 diabetes mellitus with hyperglycemia: ICD-10-CM

## 2018-05-04 DIAGNOSIS — F419 Anxiety disorder, unspecified: ICD-10-CM

## 2018-05-04 DIAGNOSIS — E10319 Type 1 diabetes mellitus with unspecified diabetic retinopathy without macular edema: ICD-10-CM

## 2018-05-04 DIAGNOSIS — G9341 Metabolic encephalopathy: ICD-10-CM

## 2018-05-04 DIAGNOSIS — B961 Klebsiella pneumoniae [K. pneumoniae] as the cause of diseases classified elsewhere: ICD-10-CM

## 2018-05-04 DIAGNOSIS — D509 Iron deficiency anemia, unspecified: ICD-10-CM

## 2018-05-04 DIAGNOSIS — I69241 Monoplegia of lower limb following other nontraumatic intracranial hemorrhage affecting right dominant side: Principal | ICD-10-CM

## 2018-05-04 DIAGNOSIS — E1022 Type 1 diabetes mellitus with diabetic chronic kidney disease: ICD-10-CM

## 2018-05-04 DIAGNOSIS — I129 Hypertensive chronic kidney disease with stage 1 through stage 4 chronic kidney disease, or unspecified chronic kidney disease: ICD-10-CM

## 2018-05-04 DIAGNOSIS — N179 Acute kidney failure, unspecified: ICD-10-CM

## 2018-05-04 DIAGNOSIS — N183 Chronic kidney disease, stage 3 (moderate): ICD-10-CM

## 2018-05-04 DIAGNOSIS — E1021 Type 1 diabetes mellitus with diabetic nephropathy: ICD-10-CM

## 2018-05-04 DIAGNOSIS — Z79899 Other long term (current) drug therapy: ICD-10-CM

## 2018-05-04 DIAGNOSIS — D631 Anemia in chronic kidney disease: ICD-10-CM

## 2018-05-04 DIAGNOSIS — I69219 Unspecified symptoms and signs involving cognitive functions following other nontraumatic intracranial hemorrhage: ICD-10-CM

## 2018-05-04 LAB — POC GLUCOSE
Lab: 117 mg/dL — ABNORMAL HIGH (ref 70–100)
Lab: 107 mg/dL — ABNORMAL HIGH (ref 70–100)
Lab: 128 mg/dL — ABNORMAL HIGH (ref 70–100)
Lab: 134 mg/dL — ABNORMAL HIGH (ref 70–100)
Lab: 227 mg/dL — ABNORMAL HIGH (ref 70–100)
Lab: 154 mg/dL — ABNORMAL HIGH (ref 70–100)
Lab: 164 mg/dL — ABNORMAL HIGH (ref 70–100)
Lab: 279 mg/dL — ABNORMAL HIGH (ref 70–100)

## 2018-05-04 LAB — CBC CELLULAR THERAPEUTICS
Lab: 3.2 M/UL — ABNORMAL LOW (ref 4.0–5.0)
Lab: 5.4 K/UL (ref 4.5–11.0)

## 2018-05-04 LAB — BASIC METABOLIC PANEL
Lab: 101 MMOL/L (ref 98–110)
Lab: 113 mg/dL — ABNORMAL HIGH (ref 70–100)
Lab: 138 MMOL/L (ref >60)
Lab: 18 mL/min — ABNORMAL LOW (ref >60)
Lab: 2.9 mg/dL — ABNORMAL HIGH (ref 0.4–1.00)
Lab: 22 mL/min — ABNORMAL LOW (ref >60)
Lab: 26 MMOL/L (ref 21–30)
Lab: 5.2 MMOL/L — ABNORMAL HIGH (ref 3.5–5.1)
Lab: 50 mg/dL — ABNORMAL HIGH (ref 7–25)
Lab: 9.6 mg/dL (ref 8.5–10.6)
Lab: 11 (ref 3–12)

## 2018-05-04 MED ORDER — FUROSEMIDE 40 MG PO TAB
40 mg | ORAL_TABLET | Freq: Every day | ORAL | 0 refills | 90.00000 days | Status: AC
Start: 2018-05-04 — End: 2019-10-25

## 2018-05-04 MED ORDER — PANTOPRAZOLE 40 MG PO TBEC
40 mg | ORAL_TABLET | Freq: Every day | ORAL | 1 refills | 90.00000 days | Status: AC
Start: 2018-05-04 — End: ?
  Filled 2018-05-04 (×2): qty 30, 30d supply, fill #1

## 2018-05-04 MED ORDER — POLYETHYLENE GLYCOL 3350 17 GRAM PO PWPK
17 g | Freq: Two times a day (BID) | ORAL | 0 refills | 18.00000 days | Status: AC
Start: 2018-05-04 — End: 2019-09-30

## 2018-05-04 MED ORDER — INSULIN SYRINGE-NEEDLE U-100 0.5 ML 31 GAUGE X 5/16" MISC SYRG
Freq: Every evening | 1 refills | Status: AC
Start: 2018-05-04 — End: 2019-10-27
  Filled 2018-05-04 (×2): qty 200, 30d supply, fill #1

## 2018-05-04 MED ORDER — MIRTAZAPINE 15 MG PO TAB
15 mg | ORAL_TABLET | Freq: Every evening | ORAL | 1 refills | Status: AC
Start: 2018-05-04 — End: 2018-06-16

## 2018-05-04 MED ORDER — CARVEDILOL 25 MG PO TAB
25 mg | ORAL_TABLET | Freq: Two times a day (BID) | ORAL | 1 refills | 90.00000 days | Status: AC
Start: 2018-05-04 — End: ?
  Filled 2018-05-04 (×2): qty 60, 30d supply, fill #1

## 2018-05-04 MED ORDER — INSULIN LISPRO 100 UNIT/ML SC SOLN
Freq: Three times a day (TID) | 1 refills | 39.00000 days | Status: AC
Start: 2018-05-04 — End: 2018-06-06
  Filled 2018-05-04 (×2): qty 10, 14d supply, fill #1

## 2018-05-04 MED ORDER — ACETAMINOPHEN 325 MG PO TAB
650 mg | ORAL_TABLET | ORAL | 0 refills | Status: AC | PRN
Start: 2018-05-04 — End: ?

## 2018-05-04 MED ORDER — INSULIN GLARGINE 100 UNIT/ML SC SOLN
10 [IU] | Freq: Every day | SUBCUTANEOUS | 1 refills | 60.00000 days | Status: AC
Start: 2018-05-04 — End: 2018-06-06

## 2018-05-04 MED ORDER — AMLODIPINE 10 MG PO TAB
10 mg | ORAL_TABLET | Freq: Every day | ORAL | 1 refills | Status: AC
Start: 2018-05-04 — End: 2019-10-25

## 2018-05-04 MED ORDER — TAMSULOSIN 0.4 MG PO CAP
.4 mg | ORAL_CAPSULE | Freq: Every day | ORAL | 1 refills | 90.00000 days | Status: AC
Start: 2018-05-04 — End: ?

## 2018-05-04 MED ORDER — CLONIDINE HCL 0.2 MG PO TAB
.2 mg | ORAL_TABLET | Freq: Three times a day (TID) | ORAL | 1 refills | Status: AC
Start: 2018-05-04 — End: 2019-10-25
  Filled 2018-05-04 (×2): qty 90, 30d supply, fill #1

## 2018-05-04 MED ORDER — INSULIN GLARGINE 100 UNIT/ML (3 ML) SC INJ PEN
10 [IU] | Freq: Every day | SUBCUTANEOUS | 3 refills | 60.00000 days | Status: AC
Start: 2018-05-04 — End: 2018-05-04
  Filled 2018-05-04 (×2): qty 45, 31d supply, fill #1

## 2018-05-04 MED ORDER — PEN NEEDLE, DIABETIC 32 GAUGE X 5/32" MISC NDLE
1 | Freq: Before meals | 1 refills | 90.00000 days | Status: AC
Start: 2018-05-04 — End: 2018-05-04

## 2018-05-05 ENCOUNTER — Encounter: Admit: 2018-05-05 | Discharge: 2018-05-05 | Payer: Private Health Insurance - Indemnity

## 2018-05-30 LAB — CULTURE-TB (AFB)

## 2018-06-06 ENCOUNTER — Encounter: Admit: 2018-06-06 | Discharge: 2018-06-06 | Payer: Private Health Insurance - Indemnity

## 2018-06-06 ENCOUNTER — Ambulatory Visit: Admit: 2018-06-06 | Discharge: 2018-06-07 | Payer: Private Health Insurance - Indemnity

## 2018-06-06 DIAGNOSIS — I10 Essential (primary) hypertension: ICD-10-CM

## 2018-06-06 DIAGNOSIS — E119 Type 2 diabetes mellitus without complications: Principal | ICD-10-CM

## 2018-06-06 MED ORDER — INSULIN LISPRO 100 UNIT/ML SC SOLN
Freq: Every day | 3 refills | 30.00000 days | Status: AC
Start: 2018-06-06 — End: 2019-10-25

## 2018-06-06 MED ORDER — INSULIN GLARGINE 100 UNIT/ML SC SOLN
10 [IU] | Freq: Every day | SUBCUTANEOUS | 3 refills | 60.00000 days | Status: AC
Start: 2018-06-06 — End: 2019-09-05

## 2018-06-07 ENCOUNTER — Encounter: Admit: 2018-06-07 | Discharge: 2018-06-07 | Payer: Private Health Insurance - Indemnity

## 2018-06-07 DIAGNOSIS — I10 Essential (primary) hypertension: ICD-10-CM

## 2018-06-07 DIAGNOSIS — R809 Proteinuria, unspecified: ICD-10-CM

## 2018-06-07 DIAGNOSIS — E119 Type 2 diabetes mellitus without complications: Principal | ICD-10-CM

## 2018-06-07 DIAGNOSIS — E1029 Type 1 diabetes mellitus with other diabetic kidney complication: Principal | ICD-10-CM

## 2018-06-07 LAB — MICROALB/CR RATIO-URINE RANDOM
Lab: 101 ug/mL — ABNORMAL HIGH (ref <19)
Lab: 153 ug/mg — ABNORMAL HIGH (ref <30)
Lab: 66 mg/dL

## 2018-06-08 ENCOUNTER — Encounter: Admit: 2018-06-08 | Discharge: 2018-06-08 | Payer: Private Health Insurance - Indemnity

## 2018-06-08 DIAGNOSIS — R809 Proteinuria, unspecified: Principal | ICD-10-CM

## 2018-06-08 MED ORDER — LISINOPRIL 5 MG PO TAB
5 mg | ORAL_TABLET | Freq: Every day | ORAL | 3 refills | Status: AC
Start: 2018-06-08 — End: 2018-06-16

## 2018-06-08 MED ORDER — TRESIBA U-100 INSULIN 100 UNIT/ML SC SOLN
10 [IU] | Freq: Every day | SUBCUTANEOUS | 3 refills | 30.00000 days | Status: AC
Start: 2018-06-08 — End: 2018-06-16

## 2018-06-10 ENCOUNTER — Encounter: Admit: 2018-06-10 | Discharge: 2018-06-10 | Payer: Private Health Insurance - Indemnity

## 2018-06-16 ENCOUNTER — Encounter: Admit: 2018-06-16 | Discharge: 2018-06-16 | Payer: Private Health Insurance - Indemnity

## 2018-06-16 ENCOUNTER — Ambulatory Visit: Admit: 2018-06-16 | Discharge: 2018-06-17 | Payer: Private Health Insurance - Indemnity

## 2018-06-16 DIAGNOSIS — I639 Cerebral infarction, unspecified: ICD-10-CM

## 2018-06-16 DIAGNOSIS — R413 Other amnesia: ICD-10-CM

## 2018-06-16 DIAGNOSIS — E119 Type 2 diabetes mellitus without complications: Principal | ICD-10-CM

## 2018-06-16 DIAGNOSIS — I10 Essential (primary) hypertension: ICD-10-CM

## 2018-06-16 DIAGNOSIS — I610 Nontraumatic intracerebral hemorrhage in hemisphere, subcortical: Secondary | ICD-10-CM

## 2018-06-16 DIAGNOSIS — R29898 Other symptoms and signs involving the musculoskeletal system: Principal | ICD-10-CM

## 2018-06-16 DIAGNOSIS — N289 Disorder of kidney and ureter, unspecified: ICD-10-CM

## 2018-06-16 MED ORDER — AMITRIPTYLINE 25 MG PO TAB
25 mg | ORAL_TABLET | Freq: Every evening | ORAL | 5 refills | Status: AC
Start: 2018-06-16 — End: 2019-10-25

## 2018-06-17 ENCOUNTER — Encounter: Admit: 2018-06-17 | Discharge: 2018-06-17 | Payer: Private Health Insurance - Indemnity

## 2018-06-17 DIAGNOSIS — R413 Other amnesia: ICD-10-CM

## 2018-06-17 DIAGNOSIS — I10 Essential (primary) hypertension: ICD-10-CM

## 2018-06-17 DIAGNOSIS — I639 Cerebral infarction, unspecified: ICD-10-CM

## 2018-06-17 DIAGNOSIS — I615 Nontraumatic intracerebral hemorrhage, intraventricular: ICD-10-CM

## 2018-06-17 DIAGNOSIS — E119 Type 2 diabetes mellitus without complications: Principal | ICD-10-CM

## 2018-06-17 DIAGNOSIS — N289 Disorder of kidney and ureter, unspecified: ICD-10-CM

## 2018-06-17 LAB — BASIC METABOLIC PANEL
Lab: 103 MMOL/L (ref 98–110)
Lab: 139 MMOL/L (ref 137–147)
Lab: 161 mg/dL — ABNORMAL HIGH (ref 70–100)
Lab: 19 mL/min — ABNORMAL LOW (ref >60)
Lab: 2.8 mg/dL — ABNORMAL HIGH (ref 0.4–1.00)
Lab: 23 mL/min — ABNORMAL LOW (ref >60)
Lab: 27 MMOL/L (ref 21–30)
Lab: 4.7 MMOL/L (ref 3.5–5.1)
Lab: 45 mg/dL — ABNORMAL HIGH (ref 7–25)
Lab: 9.3 mg/dL (ref 8.5–10.6)
Lab: 9 (ref 3–12)

## 2018-06-21 ENCOUNTER — Encounter: Admit: 2018-06-21 | Discharge: 2018-06-21 | Payer: Private Health Insurance - Indemnity

## 2018-08-03 ENCOUNTER — Encounter: Admit: 2018-08-03 | Discharge: 2018-08-03 | Payer: Private Health Insurance - Indemnity

## 2019-06-21 IMAGING — CT BRAIN WO(Adult)
3 of 4 series · 14 of 47 positions shown, 16 images · non-contrast
Comparison: none

[Series 2: brain ax 5.00 hr40 s3 · axial · 0.34mm/px · z∈[-546,-412]mm · 8 of 33 slices shown, 10 images]
[im 3/33  brain]
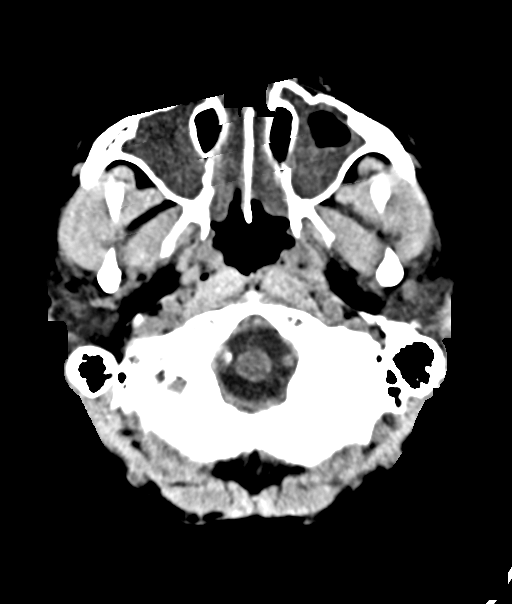
[im 3/33  bone]
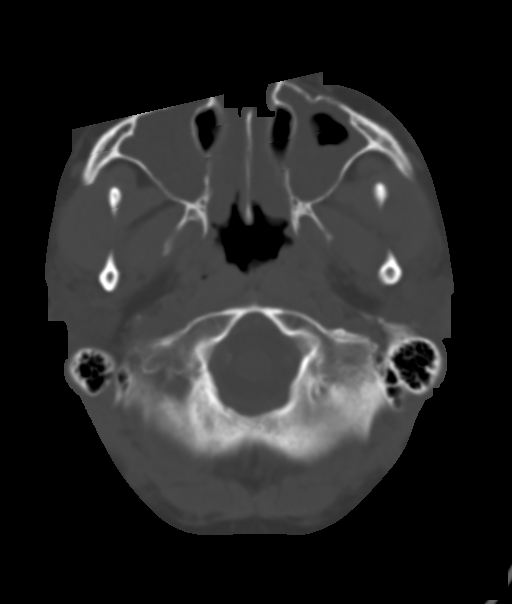
[im 7/33  brain]
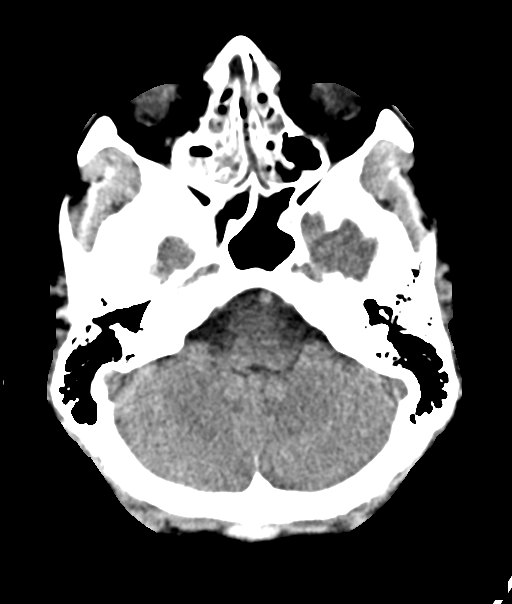
[im 12/33  brain]
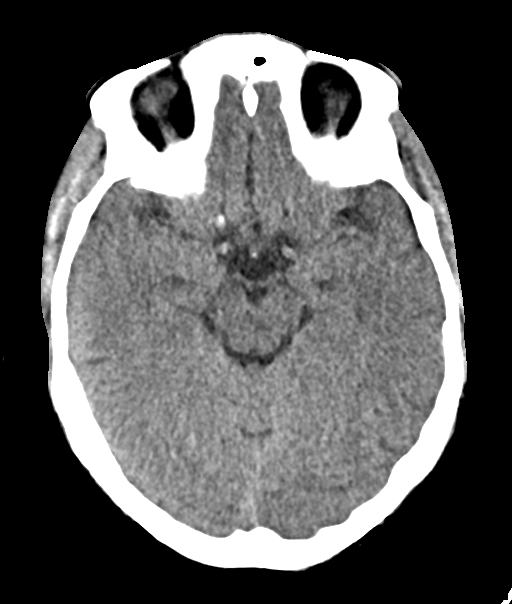
[im 14/33  brain]
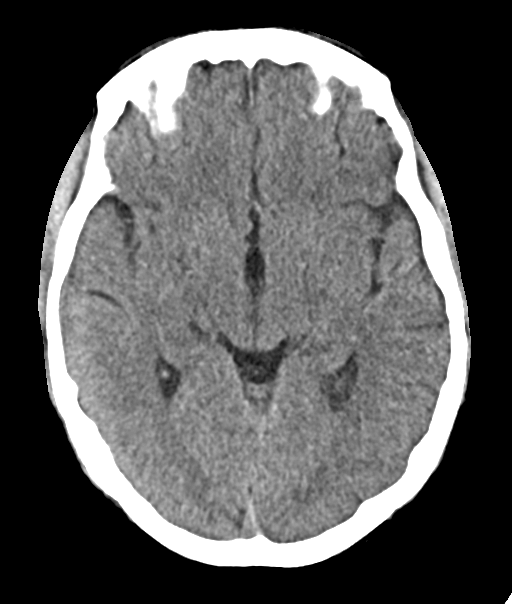
[im 19/33  brain]
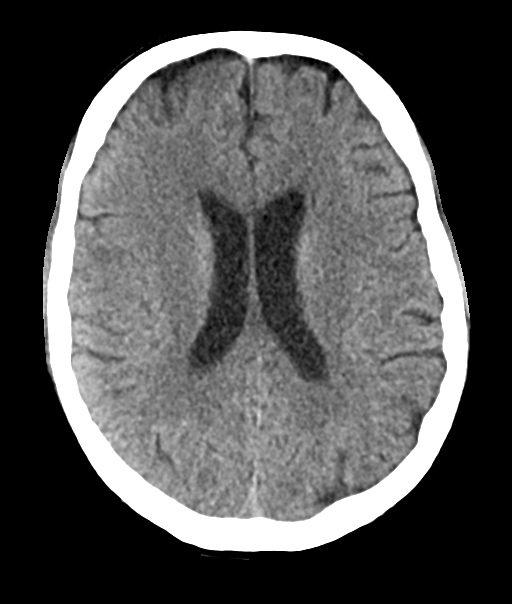
[im 19/33  bone]
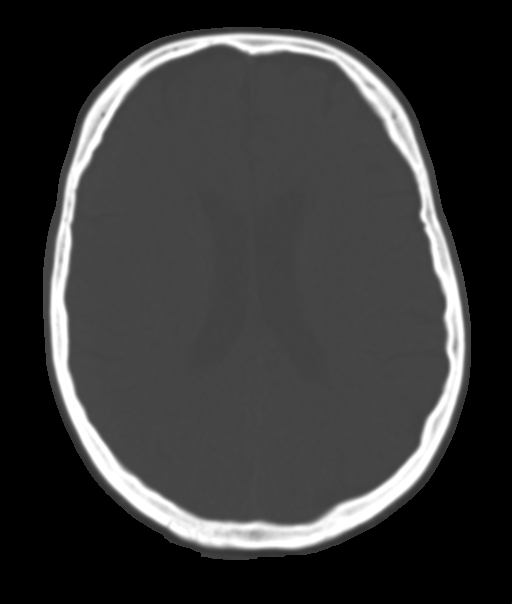
[im 21/33  brain]
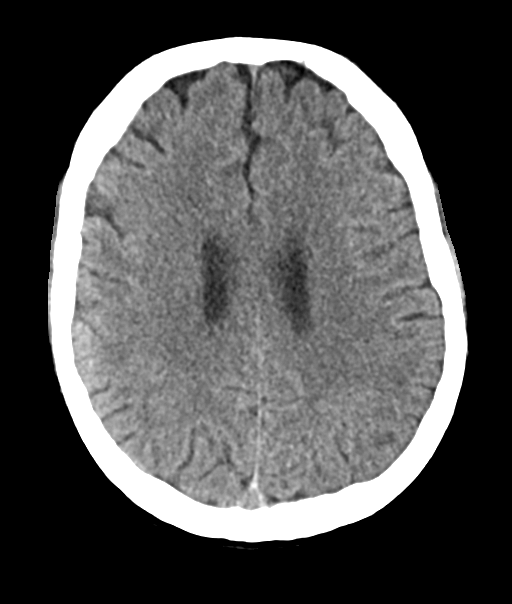
[im 26/33  brain]
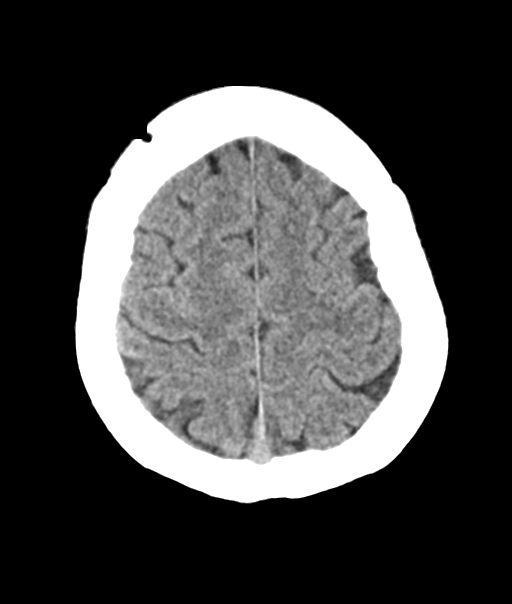
[im 30/33  brain]
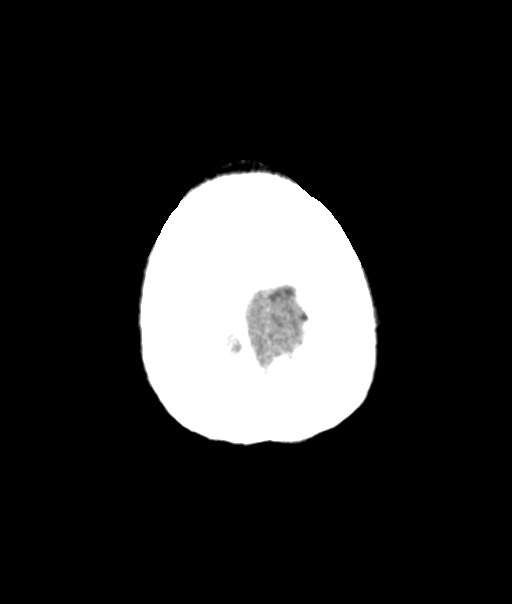

[Series 4: brain cor 5.00 hr40 s3 · coronal · 0.33mm/px · 3 of 41 slices shown]
[im 14/41  brain]
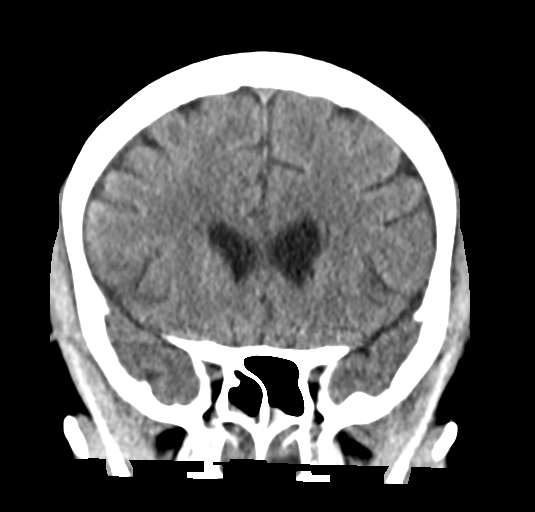
[im 18/41  brain]
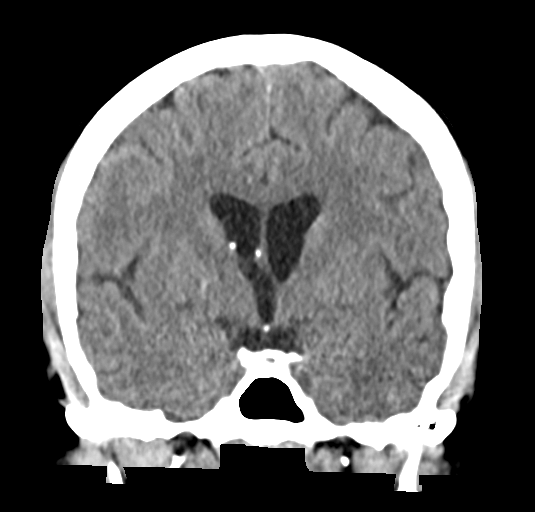
[im 23/41  brain]
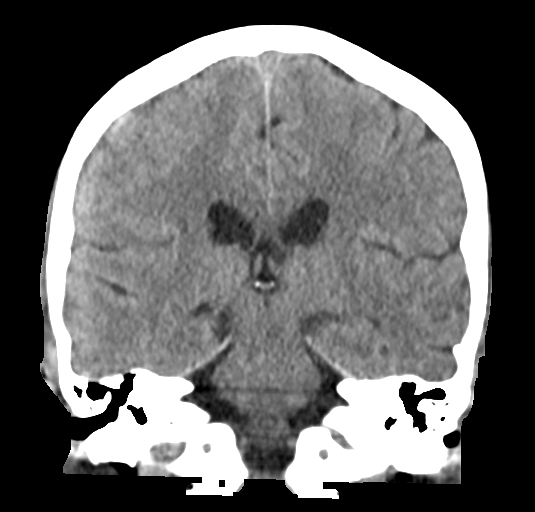

[Series 6: brain sag 5.00 hr40 s3 · sagittal · 0.33mm/px · 3 of 34 slices shown]
[im 12/34  brain]
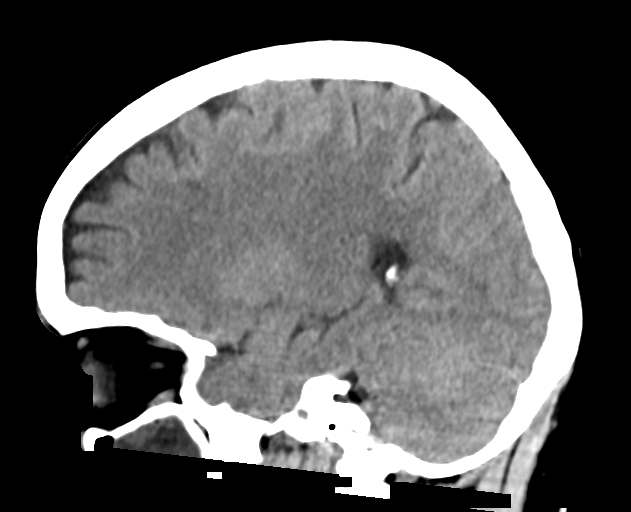
[im 17/34  brain]
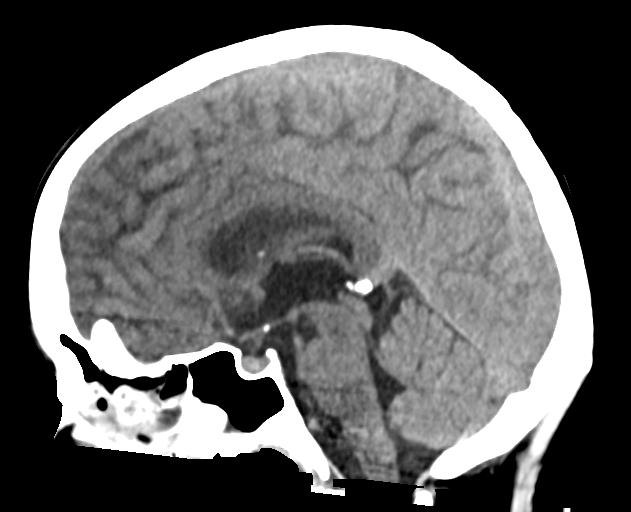
[im 23/34  brain]
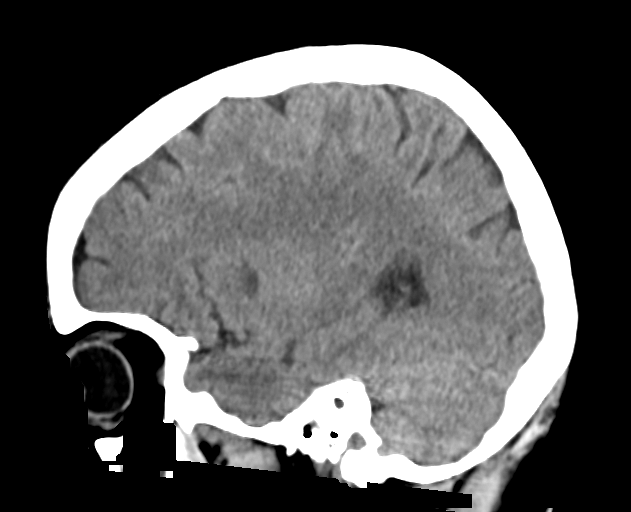

[14 of 47 positions shown; findings below may reference images not displayed]

EXAM
COMPUTED TOMOGRAPHY, HEAD OR BRAIN; WITHOUT CONTRAST MATERIAL CPT 75595

INDICATION
Headache for the past 1hours, previa intracranial bleed 7months ago, with sx- denies preg, AK

TECHNIQUE
Multiple contiguous transaxial images were obtained through the brain utilizing a multidetector CT
scanner.
All CT scans at this facility use dose modulation, iterative reconstruction, and/or weight based
dosing when appropriate to reduce radiation dose to as low as reasonably achievable.

COMPARISONS
Previous examination dated 03/17/2018.

FINDINGS
There is diffuse cortical atrophy. The ventricles and sulci are accordingly prominent. There is no
mass effect or shift in the midline structures. Lucency throughout the centrum ovale is consistent
with deep white matter ischemic changes and periventricular leukomalacia. Bilateral chronic basal
ganglia lacunar infarcts are noted. The skull base overlying and overlying calvarium are within
normal limits.

IMPRESSION
Cortical atrophy and age related involutional changes. Periventricular leukomalacia. No acute
intracranial hemorrhage.

Tech Notes:

headache x2 hours, hx of intracranial bleed x5 months ago, with sx- denies preg, AK

## 2019-07-17 ENCOUNTER — Encounter: Admit: 2019-07-17 | Discharge: 2019-07-17 | Payer: Commercial Managed Care - Pharmacy Benefit Manager

## 2019-07-25 ENCOUNTER — Encounter: Admit: 2019-07-25 | Discharge: 2019-07-25 | Payer: Commercial Managed Care - Pharmacy Benefit Manager

## 2019-07-25 NOTE — Telephone Encounter
New Referral Intake  Referral is also for Pancreas  Patient stated she had a stroke a year ago and might have some issues remembering. Did complete intake  TO PATIENT:  Thank you for contacting us, how did  you hear about our transplant program? Neph    Do you have a support person who will be with you before, during, and after a transplant?Yes   Name and Relationship of Support Person: Brandy Joseph    Is English your primary language? Yes   If no, do you or your support person need an interpreter?     Primary Insurance Company: [facility] Medicaid  Policy #: [id number]  Group ID:     Secondary Insurance Company: NA  Policy #:   Group ID:     Would you accept a lifesaving blood transfusion? Yes  Have you had a hospitalization in the past 6 months?  No  Who is your kidney doctor?  Dr. Danton Clap  Who is your primary doctor?  Dr. Belinda Fisher      Past Medical History:  (if answer is yes, please specify)    How tall are you?  5 6   How much do you weigh? 150   Calculated BMI:  24.2  What is the cause of your kidney disease?  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 Age 5, sees Dr. [facility] [employer] she thinks   Are you currently on dialysis?  No   Type/Start date/ Schedule:     What center:     Have you been denied or listed by another transplant center? No  Are you currently working with any other transplant centers? No  Have you had a previous organ transplant? No    History of:  Heart disease?  No  Do you have a heart doctor? No  History of heart stents?  No   Are you on medication to keep stents open (ex. Coumadin, Plavix, Brilinta)? No   Are you on medication to help raise your blood pressure (Midodrine)? No   Lung disease? No   Are you on oxygen (How many liters)? No    with dialysis or all the time?    Do you wear CPAP (breathing machine at night to sleep)? No   Do you use tobacco or nicotine products or have you in the past? No Packs/cans per day?  for years?   When did you quit?    Do you use any marijuana products or illegal substances or have you in the past? No  TO PATIENT: If you currently smoke, you need to quit to be considered a transplant candidate.  Liver disease?  No   what kind, when diagnosed?   History of HIV or Hepatitis? No  Cancer? No   what kind, when diagnosed?   Any active infections? No    open wounds or sores? No   Psychiatric illness (Depression/Anxiety/Bipolar Disorder)?  No    Do you see a psychiatrist or counselor?  No  Do you use a cane/walker/ other assistive devices to get around? No    Past Surgical History:    Have you had any abdominal surgeries? C-Section  Have you had any surgeries on blood vessels? No  Have you had any amputations?  No    Demographic and Social History:    What gender were you assigned at birth?  Female  What gender do you identify with currently? Female  What  race do you identify with? Caucasian      Do you have an interest living donors? No   If so, they are welcome to come to evaluation with you.    We may need you to sign a Release of Information Authorization Form. Do you have access to a fax machine or an e-mail address we can send it to? In chart     TO PATIENT: You will need to bring your support person with you to the transplant evaluation.  This is required to be considered a candidate for transplant.  If you have interested living donors, they are welcome to come with you, as well.       Note to staff -   Obtain the following records:  ? nephrology note  ? hospital discharge summary within 6 month (or most recent)   ? pathology report    Notify Dr. Curt Bears if delay in scheduling due to lack of records     If patient has possible living donor - have them call:  ? [place] - [phone]  ? Lelon Mast [phone]

## 2019-07-27 ENCOUNTER — Encounter: Admit: 2019-07-27 | Discharge: 2019-07-27 | Payer: Commercial Managed Care - Pharmacy Benefit Manager

## 2019-07-28 ENCOUNTER — Encounter: Admit: 2019-07-28 | Discharge: 2019-07-28 | Payer: Commercial Managed Care - Pharmacy Benefit Manager

## 2019-08-04 ENCOUNTER — Encounter: Admit: 2019-08-04 | Discharge: 2019-08-04 | Payer: Commercial Managed Care - Pharmacy Benefit Manager

## 2019-08-04 NOTE — Telephone Encounter
TRANSPLANT BENEFIT COLLECTION:    Verified by: Gearldine Shown  Date: January 29,2021  ID #: [id number] Subscriber: self  Ins Plan: Roseanna Rainbow   EFF: 07/07/19 Plan Type: [place] Medicaid  NO SPEND DOWN REPORTED AS OF 08/04/2019  NURSING HOME/LONG TERM CARE [place] $3/VISIT  SURGICENTER $3/DOS  OUTPT REHAB $1/VISIT  DME $3/CLAIM  HHC $3/VISIT  NON-EMERGENCY TRANSPORT OR AMBULANCE $3/DOS  OV $2/COPAY  [place] [phone], $3/VISIT  DENTAL $3/DOS  RX $3/FILL (NO MAIL ORDER)  Patient is cleared for Telehealth Evaluation  If 2ndry to Medicare: PAYS [place] AFTER PART B  **IS IT COVERED? CALL [phone]    TXP Network: Claria Dice  [place]  Phone #: [phone] FAX #: [phone]  Auth requirements: Eval and listing auth required        PLEASE NOTIFY TFA 15 BUSINESS DAYS PRIOR TO EVAL APPT SO TFA CAN OBTAIN AUTHORIZATION FOR THE EVALUATION.

## 2019-08-14 ENCOUNTER — Encounter: Admit: 2019-08-14 | Discharge: 2019-08-14 | Payer: Commercial Managed Care - Pharmacy Benefit Manager

## 2019-08-14 DIAGNOSIS — Z01818 Encounter for other preprocedural examination: Secondary | ICD-10-CM

## 2019-08-16 ENCOUNTER — Encounter: Admit: 2019-08-16 | Discharge: 2019-08-16 | Payer: Commercial Managed Care - Pharmacy Benefit Manager

## 2019-08-16 NOTE — Telephone Encounter
TFA obtained clinicals and will fax them to [facility] at fax # [phone] for patients kidney/panc transplant evaluation auth approval

## 2019-08-17 ENCOUNTER — Encounter: Admit: 2019-08-17 | Discharge: 2019-08-17 | Payer: Commercial Managed Care - Pharmacy Benefit Manager

## 2019-08-17 NOTE — Telephone Encounter
TRANSPLANT AUTHORIZATIONS    PLAN: [employer] AUTHORIZATION Phase 1 Evaluation  Auth #: [id number] eff: 08/16/2019 - 08/15/2020  NCM: Arma Heading  Phone #: [phone] FAX #: [phone]    PLAN:OPTUM   AUTHORIZATION Phase 2 Listing  Auth #:  [id number] eff: 08/16/2019 - no expiration as long as active with plan  NCM: Arma Heading  Phone #: [phone] FAX #: [phone]

## 2019-09-05 ENCOUNTER — Encounter: Admit: 2019-09-05 | Discharge: 2019-09-05 | Payer: Commercial Managed Care - Pharmacy Benefit Manager

## 2019-09-05 ENCOUNTER — Ambulatory Visit: Admit: 2019-09-05 | Discharge: 2019-09-05 | Payer: MEDICAID

## 2019-09-05 ENCOUNTER — Ambulatory Visit: Admit: 2019-09-05 | Discharge: 2019-09-05 | Payer: Commercial Managed Care - Pharmacy Benefit Manager

## 2019-09-05 DIAGNOSIS — I639 Cerebral infarction, unspecified: Secondary | ICD-10-CM

## 2019-09-05 DIAGNOSIS — N186 End stage renal disease: Secondary | ICD-10-CM

## 2019-09-05 DIAGNOSIS — N289 Disorder of kidney and ureter, unspecified: Secondary | ICD-10-CM

## 2019-09-05 DIAGNOSIS — R29898 Other symptoms and signs involving the musculoskeletal system: Secondary | ICD-10-CM

## 2019-09-05 DIAGNOSIS — E1042 Type 1 diabetes mellitus with diabetic polyneuropathy: Secondary | ICD-10-CM

## 2019-09-05 DIAGNOSIS — Z01818 Encounter for other preprocedural examination: Secondary | ICD-10-CM

## 2019-09-05 DIAGNOSIS — R413 Other amnesia: Secondary | ICD-10-CM

## 2019-09-05 DIAGNOSIS — E119 Type 2 diabetes mellitus without complications: Secondary | ICD-10-CM

## 2019-09-05 DIAGNOSIS — I10 Essential (primary) hypertension: Secondary | ICD-10-CM

## 2019-09-05 LAB — TOXOPLASMA IGG: Lab: NEGATIVE MMOL/L (ref 98–110)

## 2019-09-05 LAB — RENAL FUNCTION PANEL
Lab: 134 MMOL/L — ABNORMAL LOW (ref 137–147)
Lab: 17 mL/min — ABNORMAL LOW (ref >60)
Lab: 26 MMOL/L (ref 21–30)
Lab: 3.7 mg/dL (ref 2.0–4.5)
Lab: 3.8 g/dL (ref 3.5–5.0)
Lab: 4.1 MMOL/L (ref 3.5–5.1)
Lab: 44 mg/dL — ABNORMAL HIGH (ref 7–25)
Lab: 9.8 mg/dL (ref 8.5–10.6)
Lab: 10 (ref 3–12)

## 2019-09-05 LAB — CBC
Lab: 10 g/dL — ABNORMAL LOW (ref 12.0–15.0)
Lab: 13 % (ref 11–15)
Lab: 28 pg (ref 26–34)
Lab: 3.7 M/UL — ABNORMAL LOW (ref 4.0–5.0)
Lab: 32 % — ABNORMAL LOW (ref 36–45)
Lab: 32 g/dL (ref 32.0–36.0)
Lab: 5.7 10*3/uL (ref 4.5–11.0)
Lab: 569 10*3/uL — ABNORMAL HIGH (ref 150–400)
Lab: 7.8 FL (ref 7–11)
Lab: 87 FL (ref 80–100)

## 2019-09-05 LAB — HEPATITIS B CORE AB TOT (IGG+IGM)

## 2019-09-05 LAB — HEPATITIS B SURFACE AB: Lab: NEGATIVE

## 2019-09-05 LAB — GGTP: Lab: 25 U/L (ref 9–64)

## 2019-09-05 LAB — URINALYSIS, MICROSCOPIC

## 2019-09-05 LAB — SYPHILIS AB SCREEN: Lab: NEGATIVE mg/dL — ABNORMAL HIGH (ref 70–100)

## 2019-09-05 LAB — HEPATITIS B SURFACE AG

## 2019-09-05 LAB — PROTIME INR (PT): Lab: 0.9 mg/dL — ABNORMAL HIGH (ref 0.8–1.2)

## 2019-09-05 LAB — HEPATITIS C ANTIBODY W REFLEX HCV PCR QUANT

## 2019-09-05 LAB — HIV 1& 2 AG-AB SCRN W REFLEX HIV 1 PCR QUANT

## 2019-09-05 NOTE — Progress Notes
Seen by:  Social work: SW reports concerns with memory. Ex step mother-n-law Fannie Knee is retired and will be the one to take her to appts. Dixie Dials sets up her pillow box. Ex husband Ines Bloomer is also involved. She has a boyfriend of 6 months that will be available. She is unsure of follow up with physicians. Unsure if she is current with her follow up appts. She lives alone. Aunt and uncle help with setting up bill payments. She is a [place] citizen, went to cosmetology school. Situational depression in the past.   Finance: Kai Levins no FAP given, no financial concerns  Pharmacy: Nonnie Done Patient brought list of meds. Pharmacists notes there are meds shown on fill history that are not on the list. She doesn't understand reasons for taking medications. States she has a friend that works in home health that sets up her pill box. She will need strong support.  Dietician: Lajuana Carry Doesn't know her A1C, thinks the last one was 11.0. History of elevated blood sugars. Weight is acceptable. Concerns with history of uncontrolled diabetes.  Surgeon: Dr. Caprice Kluver - good candidate, needs CT today  Nephrologist: Martyn Ehrich APRN/Dr. Chales Abrahams [facility] candidate. Poor memory due to stroke in Sept 2019. Scored 5/5 on mini cog, but frequently asked what the date is when filling out forms. Poor historian regarding her medical history. Ex mother-n-law attended appt and did not know much about her health history. She couldn't remember who her nephrologist is. Blood sugar over 400 while in clinic. She is unable to use an insulin pump due to poor memory. Needs neuro psych testing, stress, echo, with a low threshold for cath, C-peptide. Coordinator to arrange cardiac testing and neuro psych testing. Patient states [place] [facility] closer to [place] home.  MOCA: Horris Latino, RN completed the testing. Patient scored 5/5.    You were seen by members of the Pre Renal/Pancreas Transplant Team today who has recommended that you complete the following:    ? Chest x-ray (today 2nd floor)  ? CT of your abdomen and pelvis WITHOUT contrast (today 2nd floor)  ? Lab work (today)  ? Echo   ? Stress Test   ? Neuropsychological testing      A referral has been placed for you to be seen by Cardiology. Someone from that office will contact you to schedule an appointment.     In addition, orders have been placed for you to have an echocardiogram and a stress test as part of your evaluation. Someone will contact you to schedule these tests.     Please work on completing your financial plan given to you by the Transplant Financial Advisor.     Please keep your Nurse Coordinator informed of ANY changes in your health and insurance status.    We recommend that you sign up for MyChart. The activation code has been provided to you in your after visit summary for today's visit.     It was a pleasure to see you today. Thank you for partnering with [facility] for your care.     If you should have any questions or concerns, please feel free to contact us.       Ralph Dowdy, RN  Pre-Renal/Pancreas Transplant Coordinator  [facility] [phone], Fax [phone], [email or web address]  [address] [place], [place] [address]

## 2019-09-05 NOTE — Progress Notes
Pharmacy Kidney and Pancreas Transplant Evaluation   I met with Brandy Joseph for their pharmacy transplant evaluation. A medication history and reconciliation were performed (including prescription medications, supplements, over the counter, and herbal products). The medication list was updated and the patient?s current medication list is included below.     Home Medications    Medication Sig   acetaminophen (TYLENOL) 325 mg tablet Take two tablets by mouth every 4 hours as needed.   amitriptyline (ELAVIL) 25 mg tablet Take one tablet by mouth at bedtime daily. Indications: Migraine Prevention   amLODIPine (NORVASC) 10 mg tablet Take one tablet by mouth daily.   bisacodyL (DULCOLAX) 5 mg tablet Take 5 mg by mouth every 24 hours as needed for Constipation.   buPROPion XL (WELLBUTRIN XL) 150 mg tablet Take 150 mg by mouth every morning. Do not crush or chew.   carvedilol (COREG) 25 mg tablet Take one tablet by mouth twice daily. Take with food.   cetirizine (ZYRTEC) 10 mg tablet Take 10 mg by mouth daily as needed.   cloNIDine (CATAPRESS) 0.2 mg tablet Take one tablet by mouth three times daily.   docusate (COLACE) 100 mg capsule Take 100 mg by mouth twice daily as needed.   FLUoxetine (PROZAC) 10 mg capsule Take 10 mg by mouth daily.   furosemide (LASIX) 40 mg tablet Take one tablet by mouth daily.   HYDROcodone/acetaminophen (NORCO) 7.5/325 mg tablet Take 1 tablet by mouth every 6 hours as needed for Pain   insulin degludec (TRESIBA FLEXTOUCH) 100 unit/mL (3 mL) injection pen Inject 9 Units under the skin daily.   insulin lispro(+) (HUMALOG) 100 unit/mL injection Use 1:9 insulin to carb ratio. Maximum of 50 units daily.   Insulin Syringe-Needle U-100 0.5 mL 31 gauge x 5/16 syrg Use with insulin injections with meals and at bedtime   lisinopriL (ZESTRIL) 10 mg tablet Take 10 mg by mouth daily.   oxyCODONE (ROXICODONE) 5 mg tablet Take 5-10 mg by mouth every 4 hours as needed for Pain (migraine) pantoprazole DR (PROTONIX) 40 mg tablet Take one tablet by mouth daily.   polyethylene glycol 3350 (MIRALAX) 17 g packet Take one packet by mouth twice daily.   tamsulosin (FLOMAX) 0.4 mg capsule Take one capsule by mouth daily after breakfast. Do not crush, chew or open capsules. Take 30 minutes following the same meal each day.        Medication Adherence:   Key (0= Never, 1= Rarely, 2= Sometimes, 3= Often, 4= Always)    Method utilized to manage medications: Patient has support, one being a friend who works in home health, that fill pillbox weekly. The patient reports she remembers to take medication throughout the day at breakfast, lunch, or dinner on her own. She always brings someone with her to medical appointments to relay changes to those who fill her pillbox.    How often do you forget to take one or more of your prescription medications? 0     How often do you decide not to take one or more of your prescription medications? 0     How often do you run out of one or more of your prescription medications before getting a new refill? 0     When you feel good, do you sometimes stop taking one or more of your prescription medications? 0     When you don?t feel good, do you sometimes stop taking one or more of your prescription medications? 0  How often do you have difficulty swallowing medications or taking your medications? 0     How often do you have difficulty affording your prescription medications? 0     How often does your pharmacy have difficulty filing your prescription medications? 0     Patient Education:  During this visit the patient was provided with an overview of therapies that will be initiated post-transplant, including but not limited to significant side effects and dosing schedules.No REMS are required for any of the prescribed medications for this patient.    Emphasis was placed on the importance of medication adherence both pre and post-transplant.  The patient was encouraged to contact the transplant pharmacist with any follow up questions.   Brandy Joseph expressed understanding of the information discussed.     Assessment:  Comments: I had the pleasure of meeting Brandy Joseph for a simultaneous kidney and pancreas transplant pharmacy evaluation. She reports compliance with medications and relies on multiple support people to fill her pillbox weekly. Medication list was taken from patient and the list she brought in dated Jan 2021. Patient was unable to fully verify list and did not know the indications for each medication. Dispense report also showed diclofenac, flexeril, potassium chloride, and rosuvastatin which were not on her list.   Potential Drug Interactions: None identified  Potential Barriers to Transplantation: Patient will need support post-transplant because of difficulty with memory.  Recommendation: Ensure patient has adequate support post-transplant to assist with frequent medication changes.     Brandy Joseph

## 2019-09-05 NOTE — Telephone Encounter
FINANCIAL Brandy EVALUATION      TFA Brandy Joseph met with Brandy Joseph. Brandy Joseph for insurance consult. There appears to be no coverage/insurance barriers to Brandy with Brandy Joseph. Brandy Joseph that it is recommended to obtain Medicare A&B. Explained to Brandy Joseph the estimated cost of Post Brandy medications and provided patient with a copy of her liabilities. Brandy Joseph expressed no insurance concerns related to cost of post-Brandy care and medications and verbalized understanding of Brandy Joseph's discussion and documents presented. Advised patient to call me if any insurance changes. Provided patient with my contact information. Patient verbalized understanding.  Authorization required prior to listing.    Documents presented:  Signed Patient Liability Form, Medication Cost Estimate.  PATIENT HAS BEEN ADVISED THAT AS A Brandy PATIENT she WILL NOT QUALIFY FOR FINANCIAL ASSISTANCE. Patient verbalized understanding.    *Financially Cleared  *NO Financial Concerns Brandy Joseph

## 2019-09-05 NOTE — Progress Notes
09/05/19      Brandy Joseph  MRN: [id number]  DOB: March 16, 1981      Dear Dr. Lorre Munroe      We had pleasure of seeing Brandy Joseph in our [facility] [facility] [place] [facility] clinic for evaluation for Kidney-Pancreas transplant and to provide informed consent. She has been Active in the Evaluation phase since 09/05/2019. She is accompanied by her former mother-in-law.     Enclosed are details from our visit.    HPI:  Brandy Joseph is a 39 y.o. 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 She was diagnosed with diabetes at the age of 46. She never had a native kidney biopsy.    She has never had a previous transplant.    She admits to recent illness and hospitalization September 2019 for CVA. She had few ER visits for hyperglycemia but denies DKA.      Past Kidney Medical History  CKD diagnosis: at age 25 and placed on Lisinopril discovered after frequent UTIs  Dialysis Initiation Date: NOD  Urine output: unable to quantify    Diabetes: type I DM  Diagnosed-at age 82  Average glucose readings: 140s  Current HgA1C: unknown  Historical HgA1C: 8.6%   Microvascular complications: reports bilateral retinopathy with laser interventions/injections, peripheral neuropathy, denies gastroparesis, DKA, delayed healing foot ulcers, hypoglycemic unawareness, orthostatic hypotension or toe amputations.  Followed with Endocrinology    RLE weakness/numbness 2016  Diabetic amytrophy    HTN:   Diagnosed at age 45.       Neurology:  CVA in 03/2018 [presumably ICH likely hypertensive]-  residual memory impairment, RLE weakness  Presented with sudden onset headaches, neck, generalized weakness and confusion       Medical History:   Diagnosis Date   ? DM (diabetes mellitus) (HCC)    ? Hypertension    ? Kidney disease    ? Memory loss    ? Stroke [facility])          Past Surgical History  Surgical History:   Procedure Laterality Date   ? ESOPHAGOGASTRODUODENOSCOPY WITH BIOPSY - FLEXIBLE N/A 03/29/2018    Performed by Virgina Organ, MD at [facility] ENDO   ? GALLBLADDER SURGERY     ? HX CESAREAN SECTION             Pre-Transplant Considerations:  CAD: No  Stroke: Yes, 2019  Cancer: No}  Liver Disease/Hepatitis:No  OSA/Pulmonary HTN: No  Midodrine use: No  Sensitizing Events: Pregnancy G1P1  DVT: No  PVD: No  Nephrotic Syndrome:No  Weight Loss needed: No  Psychiatric: depression and anxiety          Comprehensive 14-point Review of Systems:  General:Negative for fever, chills, weakness, anorexia, fatigue, malaise or unintentional weight loss/weight gain, falls. As per HPI    Skin: Negative for Rash, Hives and Ulcer    HENT: Negative for headache, diplopia, tinnitus, rhinorrhea, epistaxis, sore throat, mouth ulcers    Lymphatics: Negative for enlarged lymph nodes    Pulmonary: Negative for cough, SOA, DOE    Extremities: Negative for leg swelling    Gastrointestinal: Negative for  GERD and Liver disease. + diarrhea, constipation    Genitourinary: Negative for hematuria and kidney stone. + history of UTIs.    Neurologic: Negative for Seizure. + stroke, RLE weakness, memory impairment     Psychological: Positive for history of  Depression and Anxiety    Endocrine: Negative for sweating, cold or  heat intolerance. No polyuria or polydipsia    Hematologic/Lymphatic:   Negative for Bleeding, Bruising, Pulmonary embolus, Lymphadenopathy and Cancer history    Viral infections/Infectious disease: History of none    All other pertinent ROS as above or negative    Exercise Tolerance:   Brandy Joseph does not exercise routinely due to RLE weakness s/p CVA 2019. She no longer works. She formerly worked in Air[employer]. She is able to ascend 2 flights of stairs without chest pain, shortness of breath, orthopnea, PND, claudication or resting leg pain.      Colonoscopy No    Mammogram Yes  Last performed: unknown    Pap Smear Yes  Last performed: 2020         Social History  Social History     Socioeconomic History   ? Marital status: Divorced     Spouse name: Not on file   ? Number of children: 1   ? Years of education: Not on file   ? Highest education level: Associate degree: occupational, Scientist, product/process development, or vocational program   Occupational History   ? Not on file   Tobacco Use   ? Smoking status: Never Smoker   ? Smokeless tobacco: Never Used   Substance and Sexual Activity   ? Alcohol use: Yes     Alcohol/week: 0.0 standard drinks     Frequency: Never     Drinks per session: 1 or 2     Binge frequency: Never     Comment: maybe 6 drinks a month   ? Drug use: Never   ? Sexual activity: Yes     Partners: Male     Birth control/protection: None, Surgical   Other Topics Concern   ? Not on file   Social History Narrative   ? Not on file       Family History  Family History   Problem Relation Age of Onset   ? Stroke Mother    ? Diabetes Paternal Grandmother    ? Heart problem Paternal Grandmother    ? Tumor Maternal Grandmother         brain   ? None Reported Son          Allergies:   Allergies   Allergen Reactions   ? Cephalexin RASH   ? Ketorolac RASH   ? Sulfa (Sulfonamide Antibiotics) EDEMA     Full body swelling; eye swelling; itchiness         Current Medications:      Current Outpatient Medications:   ?  acetaminophen (TYLENOL) 325 mg tablet, Take two tablets by mouth every 4 hours as needed., Disp: 30 tablet, Rfl: 0  ?  amitriptyline (ELAVIL) 25 mg tablet, Take one tablet by mouth at bedtime daily. Indications: Migraine Prevention, Disp: 30 tablet, Rfl: 5  ?  amLODIPine (NORVASC) 10 mg tablet, Take one tablet by mouth daily., Disp: 30 tablet, Rfl: 1  ?  bisacodyL (DULCOLAX) 5 mg tablet, Take 5 mg by mouth every 24 hours as needed for Constipation., Disp: , Rfl:   ?  buPROPion XL (WELLBUTRIN XL) 150 mg tablet, Take 150 mg by mouth every morning. Do not crush or chew., Disp: , Rfl:   ?  carvedilol (COREG) 25 mg tablet, Take one tablet by mouth twice daily. Take with food., Disp: 60 tablet, Rfl: 1  ?  cetirizine (ZYRTEC) 10 mg tablet, Take 10 mg by mouth daily as needed., Disp: , Rfl:   ?  cloNIDine (CATAPRESS) 0.2  mg tablet, Take one tablet by mouth three times daily., Disp: 90 tablet, Rfl: 1  ?  docusate (COLACE) 100 mg capsule, Take 100 mg by mouth twice daily as needed., Disp: , Rfl:   ?  FLUoxetine (PROZAC) 10 mg capsule, Take 10 mg by mouth daily., Disp: , Rfl:   ?  furosemide (LASIX) 40 mg tablet, Take one tablet by mouth daily., Disp: 90 tablet, Rfl: 0  ?  HYDROcodone/acetaminophen (NORCO) 7.5/325 mg tablet, Take 1 tablet by mouth every 6 hours as needed for Pain, Disp: , Rfl:   ?  insulin degludec (TRESIBA FLEXTOUCH) 100 unit/mL (3 mL) injection pen, Inject 9 Units under the skin daily., Disp: , Rfl:   ?  insulin lispro(+) (HUMALOG) 100 unit/mL injection, Use 1:9 insulin to carb ratio. Maximum of 50 units daily., Disp: 3 vial, Rfl: 3  ?  Insulin Syringe-Needle U-100 0.5 mL 31 gauge x 5/16 syrg, Use with insulin injections with meals and at bedtime, Disp: 200 each, Rfl: 1  ?  lisinopriL (ZESTRIL) 10 mg tablet, Take 10 mg by mouth daily., Disp: , Rfl:   ?  oxyCODONE (ROXICODONE) 5 mg tablet, Take 5-10 mg by mouth every 4 hours as needed for Pain (migraine)  , Disp: , Rfl:   ?  pantoprazole DR (PROTONIX) 40 mg tablet, Take one tablet by mouth daily., Disp: 30 tablet, Rfl: 1  ?  polyethylene glycol 3350 (MIRALAX) 17 g packet, Take one packet by mouth twice daily., Disp: 12 each, Rfl: 0  ?  tamsulosin (FLOMAX) 0.4 mg capsule, Take one capsule by mouth daily after breakfast. Do not crush, chew or open capsules. Take 30 minutes following the same meal each day., Disp: 30 capsule, Rfl: 1        CBC w/Diff    Lab Results   Component Value Date/Time    WBC 5.7 09/05/2019 03:50 PM    RBC 3.70 (L) 09/05/2019 03:50 PM    HGB 10.4 (L) 09/05/2019 03:50 PM    HCT 32.5 (L) 09/05/2019 03:50 PM    MCV 87.7 09/05/2019 03:50 PM    MCH 28.2 09/05/2019 03:50 PM    MCHC 32.2 09/05/2019 03:50 PM    RDW 13.9 09/05/2019 03:50 PM    PLTCT 569 (H) 09/05/2019 03:50 PM    MPV 7.8 09/05/2019 03:50 PM    Lab Results   Component Value Date/Time    NEUT 30 (L) 04/30/2018 06:10 AM    ANC 1.60 (L) 04/30/2018 06:10 AM    LYMA 48 (H) 04/30/2018 06:10 AM    ALC 2.60 04/30/2018 06:10 AM    MONA 8 04/30/2018 06:10 AM    AMC 0.40 04/30/2018 06:10 AM    EOSA 12 (H) 04/30/2018 06:10 AM    AEC 0.60 (H) 04/30/2018 06:10 AM    BASA 2 04/30/2018 06:10 AM    ABC 0.10 04/30/2018 06:10 AM        Comprehensive Metabolic Profile    Lab Results   Component Value Date/Time    NA 134 (L) 09/05/2019 03:50 PM    K 4.1 09/05/2019 03:50 PM    CL 98 09/05/2019 03:50 PM    CO2 26 09/05/2019 03:50 PM    GAP 10 09/05/2019 03:50 PM    BUN 44 (H) 09/05/2019 03:50 PM    CR 3.01 (H) 09/05/2019 03:50 PM    GLU 260 (H) 09/05/2019 03:50 PM    GLU 202 (H) 08/11/2007 02:28 PM    Lab Results   Component  Value Date/Time    CA 9.8 09/05/2019 03:50 PM    PO4 3.7 09/05/2019 03:50 PM    ALBUMIN 3.8 09/05/2019 03:50 PM    TOTPROT 5.6 (L) 04/05/2018 03:30 AM    ALKPHOS 45 04/05/2018 03:30 AM    AST 10 04/05/2018 03:30 AM    ALT 7 04/05/2018 03:30 AM    TOTBILI 0.3 04/05/2018 03:30 AM    GFR 17 (L) 09/05/2019 03:50 PM    GFRAA 21 (L) 09/05/2019 03:50 PM        Urine    Lab Results   Component Value Date/Time    UCOLOR AMBER 04/19/2018 05:30 AM    TURBID 2+ (A) 04/19/2018 05:30 AM    USPGR 1.013 04/19/2018 05:30 AM    UPH 6.0 04/19/2018 05:30 AM    UPROTEIN 2+ (A) 04/19/2018 05:30 AM    UAGLU NEG 04/19/2018 05:30 AM    UKET NEG 04/19/2018 05:30 AM    UBILE NEG 04/19/2018 05:30 AM    UBLD 1+ (A) 04/19/2018 05:30 AM    UROB NORMAL 04/19/2018 05:30 AM    Lab Results   Component Value Date/Time    UNIT NEG 04/19/2018 05:30 AM    ULEU 3+ (A) 04/19/2018 05:30 AM    UWBC 0-2 09/05/2019 04:10 PM    URBC 0-2 09/05/2019 04:10 PM            Physical Exam:  Vitals:    09/05/19 1200 09/05/19 1201   BP: (!) 142/72 127/66   BP Source: Arm, Left Upper Arm, Left Upper   Patient Position: Sitting Standing   Pulse: 78 85 SpO2: 99%    Weight: 80.1 kg (176 lb 9.6 oz)    Height: 167.6 cm (66)    PainSc: Zero      Body mass index is 28.5 kg/m?Marland Kitchen    General:NAD, Alert and Oriented x4, calm and pleasant, appears to be stated age  HEENT exam: Moist Mucus Membranes, No oral lesions, wearing mask, upper partial  Neck: Normal ROM, no LAD, no JVD. No carotid bruit is present  Heart: RRR, S1, S2, without murmur, rub, or gallop  Lungs: Clear to auscultation bilaterally without wheezes or rhonchi  Abdomen: Soft, no pannus. Non tender abdomen without hepatosplenomegaly  Neuro: Nonfocal deficits without tremors  M/S: Normal ROM and decreased RLE strength  Skin: RUE tattoo   Ext: No edema is present in the lower extremities.   2+ dorsalis pedis pulses are present  Vascular: 2+ femoral pulses are present  Psyc: Appropriate affect and mood      Mini Cognitive assessment:  5/5    EKG: SR, Rate 76  PR 173   QRSD 93  QT 376  QTc 423       No results found for this or any previous visit.    Results for orders placed during the hospital encounter of 03/17/18   CT ABD/PELV WO CONTRAST    Narrative CT ABDOMEN AND PELVIS    Clinical Indication:  Female, 39 year old. Abdominal pain    Technique: Multiple contiguous axial CT images were obtained through the abdomen and pelvis without IV contrast. Post processing coronal and sagittal reconstruction images were made from the axial images.     IV contrast: None.  Bowel contrast:  None    Comparison: CT pelvis October 02, 2014, renal ultrasound from 03/23/2018 and chest x-ray from earlier today.    FINDINGS:    Limited evaluation without the use of IV contrast which includes the  viscera and vasculature.    Lower Thorax: Heart size is normal. Small left greater than right pleural effusions. Groundglass and alveolar consolidations are seen in the bilateral lower lobes. Right lower lobe calcified granuloma.    Liver and Biliary system: Liver is mildly enlarged. No obvious focal hepatic mass. Prior cholecystectomy. No significant biliary ductal dilatation.    Spleen: Spleen is normal in size.    Adrenal Glands and Kidneys: Adrenal glands are unremarkable. Development of mild bilateral hydronephrosis and proximal to mid hydroureter, left greater than right, with ureteral dilatation extending approximately to the level of the ureters crossing the iliac vessels. Additional mild bilateral symmetric perinephric stranding and minimal right periureteral stranding. Distal ureters are normal in caliber bilaterally without definite ureteral calculus. There are some tiny bilateral pelvic calcifications adjacent to the ureters consistent with phleboliths. Tiny nonobstructive lower pole right renal calculus and additional punctate nonobstructing left upper pole calculus (coronal image 107).     Pancreas and Retroperitoneum: Pancreas is unremarkable. No retroperitoneal lymphadenopathy.    Aorta and Major Vessels: Abdominal aorta is normal in caliber with minimal atherosclerotic calcification at the bifurcation and internal iliac arteries.    Bowel, Mesentery and Peritoneal space: Large and small bowel loops are normal in caliber. There is mild to moderate circumferential wall thickened appearance of the third and fourth portions of the duodenum and proximal jejunum. Trace ascites, mainly at the bilateral lower quadrants. The appendix is not definitely delineated though there appears to be normal appendix partially visualized. No pneumoperitoneum. No mesenteric adenopathy.    Pelvis: The bladder is markedly distended, appearing to compress the right ureter as it crosses the iliac vessels. The uterus and adnexa are unremarkable. No iliac or inguinal adenopathy, with some normal-sized and reactive appearing bilateral inguinal nodes present.    Abdominal wall and Osseous Structures: Partial visualization of bilateral breast implants. 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 No aggressive osseous lesions. Mild to moderate diffuse body wall edema without focal abdominal wall fluid collection.      Impression 1.  There is a wall thickened appearance of the distal duodenum and proximal jejunum is nonspecific though may be infectious or inflammatory enteritis. No bowel obstruction, abscess or pneumoperitoneum.  2.  Development of mild bilateral hydronephrosis and proximal and mid hydroureter, right greater than left, since ultrasound 4 days earlier. This may be secondary to new marked bladder distention with some compression of the right ureter as it crosses the iliac vessels.  3.  Punctate nonobstructing nephrolithiasis, though no ureteral calculus is identified.  4.  Groundglass and alveolar opacities are seen in the bilateral lower lobes. This is nonspecific though favored for pneumonia, possibly from aspiration which occurred while the patient was supine. Pulmonary edema would be a less likely consideration though would likely be noncardiogenic pulmonary edema given the lack of interstitial pulmonary edema, cardiomegaly or pulmonary venous congestion.  5.  Small bilateral pleural effusions, left greater than right.  6.  Trace ascites and diffuse body wall edema, possibly related to fluid overload.  7.  Mild hepatomegaly.        By my electronic signature, I attest that I have personally reviewed the images for this examination and formulated the interpretations and opinions expressed in this report       Finalized by Sherolyn Buba, M.D. on 03/27/2018 9:04 PM. Dictated by Delfin Gant, M.D. on 03/27/2018 8:30 PM.         Last CXR 2 View:  None  Assessment and Plan:    Brandy Joseph is a 39 y.o. year old Caucasian lady with CKD IV from type I DM. Her PMH includes CVA in 2019 [facility] presumably from HTN] with residual memory impairment and RLE weakness, noncompliance, UTIs. Today her glucose was 490 mg/dl as she became symptomatic. She administered herself 8 units of insulin. She currently does not use an insulin pump due to her memory impairment which resulted in multiple close administration of insulin. Her memory impairment is definitely a barrier. The majority of her medical history was obtained from chart review due to patient inability to recall details. She does seem to have multiple members within her support system. She resides alone and is independent with her ADLs. She does have residual RLE weakness with minimal use of assistive devices.     Brandy Joseph is a fair candidate for a Kidney-Pancreas transplant.    Potential barriers for Transplantation:  Multiple medical comorbidites, Non-adherence to medical regimen and significant memory issues    Prior to listing, she will need:   Serologies to include HgA1C, C- Peptide  Neuropsych testing   Cardiac evaluation to include cardiac catheterization        Potential living donors: No        I explained the advantages of transplantation over dialysis to the patient. I explained the advantages of living kidney donation over deceased donation. I reviewed immunosuppression and the increased risk of infections, diabetes, and malignancy post-transplantation. Brandy Joseph was accompanied by former mother-in-law and had limited questions.         We also discussed KDPI scores and new allocation system. The use, compliance with meds and side effects of immunosuppression including cancer and infection were also discussed. The risk of primary graft non-function, SGF, DGF, rejection, primary disease recurrence were also discussed. The patient demonstrated a full and adequate understanding and denied any questions or concerns at this time.    Brandy Joseph was informed they have a right to refuse any kidney that is offered to them for transplant without affecting their status on the wait list.    We will complete the evaluation process and discuss candidacy in the Transplant Selection Committee for a final decision.    Thank you for giving [place] the opportunity in taking care of this patient.  Please do not hesitate to contact us with any questions or concerns that you may have at [phone]      Sincerely,  Alessandra Bevels, APRN-NP  [phone]    CC: Huntington, Melissa  CC: Lorre Munroe

## 2019-09-06 ENCOUNTER — Encounter: Admit: 2019-09-06 | Discharge: 2019-09-06 | Payer: Commercial Managed Care - Pharmacy Benefit Manager

## 2019-09-06 NOTE — Committee Review
Committee Review Note     Evaluation Date: 09/05/2019  Committee Review Date: 09/06/2019    Organ being evaluated for: Kidney/Pancreas    Transplant Phase: Evaluation  Transplant Status:  Active    Transplant Coordinator: Ralph Dowdy  Transplant Surgeon:       Referring Physician: Lorre Munroe    Primary Diagnosis:   Secondary Diagnosis:     Committee Review Members:  Dietician, Registered Lajuana Carry   Financial Counseling and Assistance Services [facility] Mayra Magana Fannie Knee, Kai Levins   Nephrology Diane Dortha Kern, MD, Renaye Rakers, MD, Angelita Ingles, MD, Corena Herter, MD, Dorna Mai, MD, Alessandra Bevels, APRN-NP   Pharmacist Nonnie Done, [place]   Psychiatry Vanita Panda, MD   Registered Nurse Nona Dell, RN, Horris Latino, RN, Carolee Rota, RN, Francee Piccolo, RN, Ralph Dowdy, RN, Alger Simons, RN, Delbert Phenix, RN, Wylene Simmer, RN   Social Worker Reginia Naas, LMSW   Social Worker, Clinical Sharlee Blew, Rocco Serene, Loura Pardon   Transplant Surgery York Cerise, MD, Nicholos Johns, MD, Joycelyn Man, MD       Transplant Eligibility: Adults age 40 or older.  A minor may be considered on an individual basis, Diabetic nephropathy (End Stage Renal Disease as evidenced by GFR of less than or equal to 20 mL/min; or currently on dialysis), Presence of diagnosed DM as evidenced by one of the following:  a. On insulin & C-peptide less than or equal to 2 ng/mL  b. On insulin & C-peptide greater than 2 ng/mL & BMI less than or equal to 28, Hyper labile diabetes including but not limited to episodes of ketoacidosis, frequent hypoglycemia or hypoglycemia unawareness, infections, and impairment in quality of life, Acceptable surgical/medical risks as determined by the transplant team, Emotional and socio-psychological stability and demonstrated adherence to the recommended medical regimen, Financial resources for transplant and post-transplant management    Committee Review Decision: Deferred    Relative Contraindications:     Absolute Contraindications:     Committee Discussion Details:        39 yo, hhx ckd, dmi, dx at 39, no native kidney bx, c peptide less than .1, stroke in 2019, CKD at 39 d/t UTI's  placed on lisinopril for kidney protection, blood sugar 490 at clinic, bilateral retinopathy, peripheral neuropathy, no other complications. Hospitalizations for DM, no DKA. Follows with Endo, unsure if this is regular. Stroke issues, forgetful. She came with strong support but is learning more about pt. She was dx with HTN at age 39, stroke in 2019 was BP related, BP over 200, presented with sudden headache and neck pain. RLE weakness and memory impairment, mini cog 5/5. Past recall is issue G1P1, physical exam ok, independent with ADL's and lives alone. She will need to update PAP, currently drinking ETOH, not heavy, DM and CVA family hx, she is not on insulin pump d/t memory issues and overdosing insulin. Takes meds from pill box filled by her friend. Needs neuro psych testing to see how she can best be helped post txp, needs cardiac cath, increase functional activity. Pap, medication management, f/u with nephrologist, work on Hgb A1c  She is NOD. Surgery: good candidate, NOD, ct fine, little bit pvd, not concerning.   Social Work:main concern, who she is following with, saw Dr. Danton Clap once, no upcoming appts. Her ex husbands step mom came with her to appt, she would be the main person, she did not know about the neph f/u, concerned with blood sugar  control. PharmD:making sure she has help with med management. Friend does pill box every two weeks. She provided med list, she also has other medications not listed on this list that differ from the chart's list.   RD: diabetes compliance TFA:she has [place] and [employer], no FAP required, no financial barriers.    Action Items: Needs cardiac cath, increase functional status, update Pap Smear, improve medication management, work on Hgb A1c, follow up with primary nephrologist, need to get KDPI from education form.    Does patient have PVD?: Yes     Financial Plan Submitted?: Not applicable     Financial Plan Approved?: Yes    Is Financial Auth Needed?: No    Recommended for KDPI > 85%:  Not applicable    Recommended for En Bloc Kidneys: No    Recommended for 2:1 Kidney Offer: No    Does patient need to return to committee?: Yes Reason: to review action items and make active

## 2019-09-06 NOTE — Progress Notes
Date of Service: 09/05/2019    Subjective:             Brandy Joseph is a 39 y.o. 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 Started on insulin at 39 years old. Uses insulin pump. No hypoglycemic unawareness. Retinopathy. Hx of stroke, thought secondary to HTN, some RLE weakness. No tobacco or blood thinners.     Review of Systems   Constitutional: Positive for fatigue.   HENT: Negative.    Respiratory: Negative.    Cardiovascular: Negative.    Gastrointestinal: Negative.    Musculoskeletal: Negative.    Skin: Negative.    Neurological: Negative.    Hematological: Negative.    Psychiatric/Behavioral: Negative.        Medical History:   Diagnosis Date   ? DM (diabetes mellitus) (HCC)    ? Hypertension    ? Kidney disease    ? Memory loss    ? Stroke [facility])      Surgical History:   Procedure Laterality Date   ? ESOPHAGOGASTRODUODENOSCOPY WITH BIOPSY - FLEXIBLE N/A 03/29/2018    Performed by Virgina Organ, MD at [facility] ENDO   ? GALLBLADDER SURGERY     ? HX CESAREAN SECTION       Family History   Problem Relation Age of Onset   ? Stroke Mother    ? Diabetes Paternal Grandmother    ? Heart problem Paternal Grandmother    ? Tumor Maternal Grandmother         brain   ? None Reported Son      Social History     Socioeconomic History   ? Marital status: Divorced     Spouse name: Not on file   ? Number of children: 1   ? Years of education: Not on file   ? Highest education level: Associate degree: occupational, Scientist, product/process development, or vocational program   Occupational History   ? Not on file   Tobacco Use   ? Smoking status: Never Smoker   ? Smokeless tobacco: Never Used   Substance and Sexual Activity   ? Alcohol use: Yes     Alcohol/week: 0.0 standard drinks     Frequency: Never     Drinks per session: 1 or 2     Binge frequency: Never     Comment: maybe 6 drinks a month   ? Drug use: Never   ? Sexual activity: Yes     Partners: Male     Birth control/protection: None, Surgical   Other Topics Concern   ? Not on file   Social History Narrative   ? Not on file        Objective:         ? acetaminophen (TYLENOL) 325 mg tablet Take two tablets by mouth every 4 hours as needed.   ? amitriptyline (ELAVIL) 25 mg tablet Take one tablet by mouth at bedtime daily. Indications: Migraine Prevention   ? amLODIPine (NORVASC) 10 mg tablet Take one tablet by mouth daily.   ? bisacodyL (DULCOLAX) 5 mg tablet Take 5 mg by mouth every 24 hours as needed for Constipation.   ? buPROPion XL (WELLBUTRIN XL) 150 mg tablet Take 150 mg by mouth every morning. Do not crush or chew.   ? carvedilol (COREG) 25 mg tablet Take one tablet by mouth twice daily. Take with food.   ? cetirizine (ZYRTEC) 10 mg tablet Take  10 mg by mouth daily as needed.   ? cloNIDine (CATAPRESS) 0.2 mg tablet Take one tablet by mouth three times daily.   ? docusate (COLACE) 100 mg capsule Take 100 mg by mouth twice daily as needed.   ? FLUoxetine (PROZAC) 10 mg capsule Take 10 mg by mouth daily.   ? furosemide (LASIX) 40 mg tablet Take one tablet by mouth daily.   ? HYDROcodone/acetaminophen (NORCO) 7.5/325 mg tablet Take 1 tablet by mouth every 6 hours as needed for Pain   ? insulin degludec (TRESIBA FLEXTOUCH) 100 unit/mL (3 mL) injection pen Inject 9 Units under the skin daily.   ? insulin lispro(+) (HUMALOG) 100 unit/mL injection Use 1:9 insulin to carb ratio. Maximum of 50 units daily.   ? Insulin Syringe-Needle U-100 0.5 mL 31 gauge x 5/16 syrg Use with insulin injections with meals and at bedtime   ? lisinopriL (ZESTRIL) 10 mg tablet Take 10 mg by mouth daily.   ? oxyCODONE (ROXICODONE) 5 mg tablet Take 5-10 mg by mouth every 4 hours as needed for Pain (migraine)     ? pantoprazole DR (PROTONIX) 40 mg tablet Take one tablet by mouth daily.   ? polyethylene glycol 3350 (MIRALAX) 17 g packet Take one packet by mouth twice daily.   ? tamsulosin (FLOMAX) 0.4 mg capsule Take one capsule by mouth daily after breakfast. Do not crush, chew or open capsules. Take 30 minutes following the same meal each day.     There were no vitals filed for this visit.  There is no height or weight on file to calculate BMI.     Labs and Diagnostic Tests:  Urinalysis:  Lab Results   Component Value Date/Time    UCOLOR AMBER 04/19/2018 05:30 AM    TURBID 2+ (A) 04/19/2018 05:30 AM    USPGR 1.013 04/19/2018 05:30 AM    UPH 6.0 04/19/2018 05:30 AM    UPROTEIN 2+ (A) 04/19/2018 05:30 AM    UAGLU NEG 04/19/2018 05:30 AM    UKET NEG 04/19/2018 05:30 AM    UBILE NEG 04/19/2018 05:30 AM    UBLD 1+ (A) 04/19/2018 05:30 AM    UROB NORMAL 04/19/2018 05:30 AM       Viral Serologies:  Viral Serologies Latest Ref Rng & Units 09/05/2019   HIV 1 & 2 AG, AB NRHIV-Non-Reactive: Negative for HIV-1 Ag and HIV-1/2 specif Non-Reactive: Negative for HIV-1 Ag and HIV-1/2 specific antibodies.       CBC with Diff:  CBC with Diff Latest Ref Rng & Units 09/05/2019 05/04/2018   WBC 4.5 - 11.0 K/UL 5.7 5.4   RBC 4.0 - 5.0 M/UL 3.70(L) 3.26(L)   HGB 12.0 - 15.0 GM/DL 10.4(L) 9.3(L)   HCT 36 - 45 % 32.5(L) 28.9(L)   MCV 80 - 100 FL 87.7 88.5   MCH 26 - 34 PG 28.2 28.5   MCHC 32.0 - 36.0 G/DL 16.1 09.6   RDW 11 - 15 % 13.9 16.9(H)   PLT 150 - 400 K/UL 569(H) 298   MPV 7 - 11 FL 7.8 9.6   NEUT 41 - 77 % - -   ANC 1.8 - 7.0 K/UL - -   LYMA 24 - 44 % - -   ALYM 1.0 - 4.8 K/UL - -   MONA 4 - 12 % - -   AMONO 0 - 0.80 K/UL - -   EOSA 0 - 5 % - -   AEOS 0 - 0.45 K/UL - -  BASA 0 - 2 % - -   ABAS 0 - 0.20 K/UL - -       CMP:  CMP Latest Ref Rng & Units 09/05/2019 06/16/2018   NA 137 - 147 MMOL/L 134(L) 139   K 3.5 - 5.1 MMOL/L 4.1 4.7   CL 98 - 110 MMOL/L 98 103   CO2 21 - 30 MMOL/L 26 27   GAP 3 - 12 10 9    BUN 7 - 25 MG/DL 16(X) 09(U)   CR 0.4 - 1.00 MG/DL 0.45(W) 0.98(J)   GLUX 70 - 100 MG/DL 191(Y) 782(N)   CA 8.5 - 10.6 MG/DL 9.8 9.3   TP 6.0 - 8.0 G/DL - -   ALB 3.5 - 5.0 G/DL 3.8 -   ALKP 25 - 562 U/L - -   ALT 7 - 56 U/L - -   TBILI 0.3 - 1.2 MG/DL - -   GFR >13 mL/min 08(M) 19(L)   GFRAA >60 mL/min 21(L) 23(L)       Other Common Labs:  Other Common Labs Latest Ref Rng & Units 09/05/2019 06/06/2018   IRON 50 - 160 MCG/DL - -   TIBC 578 - 469 MCG/DL - -   PSAT 28 - 42 % - -   FER 10 - 200 NG/ML - -   PO4 2.0 - 4.5 MG/DL 3.7 -   MG 1.6 - 2.6 mg/dL - -   UPRO NEG-NEG - -   UCRR MG/DL - 66   UPCRC - - -   GEX5M 4.0 - 6.0 % - -       Imaging:  Results for orders placed during the hospital encounter of 09/05/19   CT ABD/PELV WO CONTRAST    Impression 1. Aortoiliac calcified plaque as above.  2. Tiny punctate nonobstructive renal calculi.  3. Mild hepatomegaly.  4. Trace pelvic free fluid, likely physiologic.       Finalized by Caro Hight, M.D. on 09/05/2019 5:21 PM. Dictated by Caro Hight, M.D. on 09/05/2019 5:10 PM.       Results for orders placed during the hospital encounter of 09/05/19   CHEST 2 VIEWS    Impression No acute cardiopulmonary abnormality.       Finalized by Golda Acre, M.D. on 09/05/2019 4:55 PM. Dictated by Golda Acre, M.D. on 09/05/2019 4:54 PM.         Physical Exam  Constitutional:       Appearance: Normal appearance.   HENT:      Head: Normocephalic.      Nose: Nose normal.      Mouth/Throat:      Mouth: Mucous membranes are moist.      Pharynx: Oropharynx is clear.   Eyes:      Extraocular Movements: Extraocular movements intact.      Pupils: Pupils are equal, round, and reactive to light.   Neck:      Musculoskeletal: Normal range of motion and neck supple.   Cardiovascular:      Rate and Rhythm: Normal rate and regular rhythm.      Pulses: Normal pulses.      Comments: Femoral pulses 1+  Pulmonary:      Effort: Pulmonary effort is normal.   Abdominal:      General: Abdomen is flat. Bowel sounds are normal. There is no distension.      Palpations: Abdomen is soft. There is no mass.      Tenderness: There is no abdominal tenderness. There is  no guarding or rebound.      Hernia: No hernia is present.   Musculoskeletal: Normal range of motion.   Skin:     General: Skin is warm and dry. Capillary Refill: Capillary refill takes less than 2 seconds.   Neurological:      General: No focal deficit present.      Mental Status: She is alert and oriented to person, place, and time. Mental status is at baseline.   Psychiatric:         Mood and Affect: Mood normal.         Behavior: Behavior normal.         Thought Content: Thought content normal.         Judgment: Judgment normal.             Assessment and Plan:  I discussed at length with the patient the risks, benefits and possible complications of kidney and pancreas transplantation and the usual perioperative course.  I also discussed the need for immunosuppression medication for as long as the patient has the transplant, and the possible complication associated with immunosuppression such as increased risk of infection and cancer.  I also discussed the benefits of receiving a kidney from a living donor.  I'll order an abdominal CT scan, and if favorable, I see no surgical contraindications to transplantation. We'll discuss the patient at the next Selection meeting.

## 2019-09-07 ENCOUNTER — Encounter: Admit: 2019-09-07 | Discharge: 2019-09-07 | Payer: Commercial Managed Care - Pharmacy Benefit Manager

## 2019-09-07 NOTE — Telephone Encounter
E-mail correspondence with patient's aunt/DPOA:    'You?re welcome.    Rocco Serene, LMSW  Renal/Pancreas Transplant Social Worker  [facility] [place] [facility]  Phone [phone] - Fax [phone] - [email or web address]  [address], [place], [place] [address]    From: [email or web address]   Sent: Thursday, September 07, 2019 2:52 PM  To: Rocco Serene [email or web address]>  Subject: RE: [External] FW: [email or web address]    thank you   -----Original Message-----  From: Rocco Serene [email or web address]>  Sent: Thursday, September 07, 2019 3:28pm  To: [email or web address]  Subject: RE: [External] FW: [email or web address]  Hi Tess,     Surina?s transplant financial advisor is Kai Levins and she can be reached at [phone] or via e-mail at [email or web address] and she should be able to answer your questions.  Let me know if I can be of any other assistance.     Rocco Serene, LMSW  Renal/Pancreas Transplant Social Worker  [facility] [place] [facility]  Phone [phone] - Fax [phone] - [email or web address]  [address], [address], [place], [place] [address]     From: [email or web address]   Sent: Thursday, September 07, 2019 2:05 PM  To: Rocco Serene [email or web address]>  Subject: RE: [External] FW: [email or web address]     Hello,     I had a question to ask and maybe you would be able to point me in the right direction.  I talked with Fannie Knee, that accompanied Danique on Tuesday and in one of her appointments they talked about Alonni's financial situation. From what i am gathering,  I don't think all of the questions were answered fully.  Fannie Knee was just unaware of what plan, with Medicaid Marcela was on, and how much was covered with her plan.     Could you give the persons name or department I need to contact and give a more complete information to her financial situation?     Thank you for your assistance. Tess Lexany Belknap Obeso's POA'    Rocco Serene, LMSW

## 2019-09-11 ENCOUNTER — Ambulatory Visit: Admit: 2019-09-11 | Discharge: 2019-09-11 | Payer: Commercial Managed Care - Pharmacy Benefit Manager

## 2019-09-11 ENCOUNTER — Encounter: Admit: 2019-09-11 | Discharge: 2019-09-11 | Payer: Commercial Managed Care - Pharmacy Benefit Manager

## 2019-09-11 DIAGNOSIS — R011 Cardiac murmur, unspecified: Secondary | ICD-10-CM

## 2019-09-12 ENCOUNTER — Encounter: Admit: 2019-09-12 | Discharge: 2019-09-12 | Payer: Commercial Managed Care - Pharmacy Benefit Manager

## 2019-09-12 NOTE — Telephone Encounter
Called and left a message to return my call in regards to the committee decision. Left my contact information    Transplant tab changed  Letter drafted and given to MA to mail

## 2019-09-15 ENCOUNTER — Encounter: Admit: 2019-09-15 | Discharge: 2019-09-15 | Payer: Commercial Managed Care - Pharmacy Benefit Manager

## 2019-09-15 NOTE — Telephone Encounter
-----   Message from Mallory Shirk sent at 09/15/2019  3:52 PM CST -----  Regarding: Medical records request  New patient scheduled to see Dr. Trudee Kuster on 04/01   Please request records from [facility], [place], [place]. Phone: [phone]; Fax: [phone]  No external cardiac records to request.    Thank you

## 2019-09-18 ENCOUNTER — Encounter: Admit: 2019-09-18 | Discharge: 2019-09-18 | Payer: Commercial Managed Care - Pharmacy Benefit Manager

## 2019-09-18 ENCOUNTER — Ambulatory Visit: Admit: 2019-09-18 | Discharge: 2019-09-18 | Payer: Commercial Managed Care - Pharmacy Benefit Manager

## 2019-09-18 DIAGNOSIS — Z8673 Personal history of transient ischemic attack (TIA), and cerebral infarction without residual deficits: Secondary | ICD-10-CM

## 2019-09-18 DIAGNOSIS — I313 Pericardial effusion (noninflammatory): Secondary | ICD-10-CM

## 2019-09-28 ENCOUNTER — Encounter: Admit: 2019-09-28 | Discharge: 2019-09-28 | Payer: Commercial Managed Care - Pharmacy Benefit Manager

## 2019-09-28 DIAGNOSIS — N179 Acute kidney failure, unspecified: Secondary | ICD-10-CM

## 2019-09-28 MED ORDER — ONDANSETRON HCL 8 MG PO TAB
4 mg | ORAL | 0 refills | Status: DC | PRN
Start: 2019-09-28 — End: 2019-10-27
  Administered 2019-09-29 – 2019-10-19 (×5): 4 mg via ORAL

## 2019-09-28 MED ORDER — METRONIDAZOLE IN NACL (ISO-OS) 500 MG/100 ML IV PGBK
500 mg | INTRAVENOUS | 0 refills | Status: CP
Start: 2019-09-28 — End: ?
  Administered 2019-09-29 – 2019-10-03 (×15): 500 mg via INTRAVENOUS

## 2019-09-28 MED ORDER — CIPROFLOXACIN HCL 500 MG PO TAB
500 mg | Freq: Two times a day (BID) | ORAL | 0 refills | Status: DC
Start: 2019-09-28 — End: 2019-09-29
  Administered 2019-09-29 (×2): 500 mg via ORAL

## 2019-09-28 MED ORDER — INSULIN ASPART 100 UNIT/ML SC FLEXPEN
0-6 [IU] | Freq: Before meals | SUBCUTANEOUS | 0 refills | Status: DC
Start: 2019-09-28 — End: 2019-09-29

## 2019-09-28 MED ORDER — CARVEDILOL 12.5 MG PO TAB
25 mg | Freq: Two times a day (BID) | ORAL | 0 refills | Status: DC
Start: 2019-09-28 — End: 2019-10-27
  Administered 2019-09-29 – 2019-10-27 (×50): 25 mg via ORAL

## 2019-09-28 MED ORDER — ACETAMINOPHEN 500 MG PO TAB
500 mg | ORAL | 0 refills | Status: DC | PRN
Start: 2019-09-28 — End: 2019-10-20
  Administered 2019-09-29 – 2019-10-20 (×32): 500 mg via ORAL

## 2019-09-28 MED ORDER — MELATONIN 3 MG PO TAB
3 mg | Freq: Every evening | ORAL | 0 refills | Status: DC | PRN
Start: 2019-09-28 — End: 2019-10-27
  Administered 2019-09-29 – 2019-10-26 (×3): 3 mg via ORAL

## 2019-09-28 MED ORDER — AMITRIPTYLINE 25 MG PO TAB
25 mg | Freq: Every evening | ORAL | 0 refills | Status: DC
Start: 2019-09-28 — End: 2019-10-18
  Administered 2019-09-29 – 2019-10-18 (×19): 25 mg via ORAL

## 2019-09-28 MED ORDER — AMLODIPINE 10 MG PO TAB
10 mg | Freq: Every day | ORAL | 0 refills | Status: DC
Start: 2019-09-28 — End: 2019-10-15
  Administered 2019-09-29 – 2019-10-14 (×16): 10 mg via ORAL

## 2019-09-28 MED ORDER — TAMSULOSIN 0.4 MG PO CAP
.4 mg | Freq: Every day | ORAL | 0 refills | Status: DC
Start: 2019-09-28 — End: 2019-10-27
  Administered 2019-09-29 – 2019-10-27 (×29): 0.4 mg via ORAL

## 2019-09-28 MED ORDER — PANTOPRAZOLE 40 MG PO TBEC
40 mg | Freq: Every day | ORAL | 0 refills | Status: DC
Start: 2019-09-28 — End: 2019-09-29
  Administered 2019-09-29: 04:00:00 40 mg via ORAL

## 2019-09-28 MED ORDER — CLONIDINE HCL 0.1 MG PO TAB
.2 mg | Freq: Three times a day (TID) | ORAL | 0 refills | Status: DC
Start: 2019-09-28 — End: 2019-10-10
  Administered 2019-09-29 – 2019-10-09 (×32): 0.2 mg via ORAL

## 2019-09-28 MED ORDER — DOCUSATE SODIUM 100 MG PO CAP
100 mg | Freq: Every day | ORAL | 0 refills | Status: DC | PRN
Start: 2019-09-28 — End: 2019-10-27
  Administered 2019-10-15 – 2019-10-16 (×2): 100 mg via ORAL

## 2019-09-28 MED ORDER — BUPROPION XL 150 MG PO TB24
150 mg | Freq: Every morning | ORAL | 0 refills | Status: DC
Start: 2019-09-28 — End: 2019-10-27
  Administered 2019-09-29 – 2019-10-26 (×28): 150 mg via ORAL

## 2019-09-28 MED ORDER — ONDANSETRON HCL (PF) 4 MG/2 ML IJ SOLN
4 mg | INTRAVENOUS | 0 refills | Status: DC | PRN
Start: 2019-09-28 — End: 2019-10-27
  Administered 2019-10-01 – 2019-10-17 (×11): 4 mg via INTRAVENOUS

## 2019-09-28 MED ORDER — HYDRALAZINE 10 MG PO TAB
10 mg | ORAL | 0 refills | Status: DC | PRN
Start: 2019-09-28 — End: 2019-10-27

## 2019-09-28 NOTE — Progress Notes
39 yr old female  PMH: CK stage 4, DM, HTN, CVA- short term memory issues  Follows at [facility]  On Kidney/Pancreas transplant list  Admitted 3/22--diarrhea x 1 month, dehydration  Occult + stool  Stool studies negative  CT abd/pelvis diffuse fluid/gas distention of colon- colitis/enteritis  IV ABX--Cipro, Flagyl  Kidney function improving   Cr 4.64 on admit--3.88 today  Hgb dropped today 9.1 on admit--6.8 today  Giving 2 units PRBCs  UA negative  Covid screen negative, no testing  WBC 10.1  Hgb 6.8  Hct 23.6  Plt 568  Na 135  K 4.8  Cl 114  CO2 13  BUN 49  Gluc 213--has insulin pump  A&O   143/68, 96, 20, 92% RA, 98.5  Family requesting transfer for higher level of care since she is in process of Kidney/Panc Tx at [facility]

## 2019-09-29 ENCOUNTER — Inpatient Hospital Stay: Admit: 2019-09-29 | Payer: MEDICAID

## 2019-09-29 ENCOUNTER — Encounter: Admit: 2019-09-29 | Discharge: 2019-09-29 | Payer: Commercial Managed Care - Pharmacy Benefit Manager

## 2019-09-29 ENCOUNTER — Inpatient Hospital Stay: Admit: 2019-09-29 | Discharge: 2019-09-29 | Payer: Commercial Managed Care - Pharmacy Benefit Manager

## 2019-09-29 LAB — COMPREHENSIVE METABOLIC PANEL
Lab: 0.4 mg/dL — ABNORMAL LOW (ref 0.3–1.2)
Lab: 13 10*3/uL — ABNORMAL HIGH (ref 3–12)
Lab: 13 MMOL/L — ABNORMAL LOW (ref 21–30)
Lab: 13 mL/min — ABNORMAL LOW (ref >60)
Lab: 137 MMOL/L — ABNORMAL LOW (ref 137–147)
Lab: 150 U/L — ABNORMAL HIGH (ref 25–110)
Lab: 16 mL/min — ABNORMAL LOW (ref >60)
Lab: 2.5 g/dL — ABNORMAL LOW (ref 3.5–5.0)
Lab: 204 mg/dL — ABNORMAL HIGH (ref 70–100)
Lab: 3.8 mg/dL — ABNORMAL HIGH (ref 0.4–1.00)
Lab: 46 mg/dL — ABNORMAL HIGH (ref 7–25)
Lab: 5.5 g/dL — ABNORMAL LOW (ref 6.0–8.0)
Lab: 6 U/L — ABNORMAL LOW (ref 7–40)
Lab: 6 U/L — ABNORMAL LOW (ref 7–56)
Lab: 8.4 mg/dL — ABNORMAL LOW (ref 8.5–10.6)

## 2019-09-29 LAB — POC GLUCOSE: Lab: 175 mg/dL — ABNORMAL HIGH (ref 70–100)

## 2019-09-29 LAB — PTT (APTT): Lab: 32 s — ABNORMAL LOW (ref 24.0–36.5)

## 2019-09-29 LAB — MAGNESIUM: Lab: 2.2 mg/dL — ABNORMAL HIGH (ref 1.6–2.6)

## 2019-09-29 LAB — CBC AND DIFF: Lab: 11 10*3/uL — ABNORMAL HIGH (ref 4.5–11.0)

## 2019-09-29 LAB — LACTIC ACID (BG - RAPID LACTATE): Lab: 0.8 MMOL/L (ref 0.5–2.0)

## 2019-09-29 LAB — PROTIME INR (PT): Lab: 1.6 M/UL — ABNORMAL HIGH (ref 0.8–1.2)

## 2019-09-29 LAB — PHOSPHORUS: Lab: 5 mg/dL — ABNORMAL HIGH (ref 2.0–4.5)

## 2019-09-29 MED ORDER — CALCIUM GLUCONATE 1G/100ML IVPB (MB+)
1 g | Freq: Once | INTRAVENOUS | 0 refills | Status: CP
Start: 2019-09-29 — End: ?
  Administered 2019-09-29 (×2): 1 g via INTRAVENOUS

## 2019-09-29 MED ORDER — PROCHLORPERAZINE EDISYLATE 5 MG/ML IJ SOLN
10 mg | Freq: Once | INTRAVENOUS | 0 refills | Status: AC
Start: 2019-09-29 — End: ?

## 2019-09-29 MED ORDER — SODIUM ZIRCONIUM CYCLOSILICATE 10 GRAM PO PWPK
10 g | ORAL | 0 refills | Status: DC
Start: 2019-09-29 — End: 2019-09-29

## 2019-09-29 MED ORDER — CIPROFLOXACIN HCL 500 MG PO TAB
500 mg | Freq: Every day | ORAL | 0 refills | Status: CP
Start: 2019-09-29 — End: ?
  Administered 2019-09-30 – 2019-10-03 (×4): 500 mg via ORAL

## 2019-09-29 MED ORDER — SW WITH SODIUM BICARBONATE IV INFUSION
150 meq | INTRAVENOUS | 0 refills | Status: DC
Start: 2019-09-29 — End: 2019-09-29
  Administered 2019-09-29 (×2): 150 meq via INTRAVENOUS

## 2019-09-29 MED ORDER — ACETAMINOPHEN/LIDOCAINE/ANTACID DS(#) 1:1:3  PO SUSP
30 mL | Freq: Once | ORAL | 0 refills | Status: CP
Start: 2019-09-29 — End: ?
  Administered 2019-09-29: 16:00:00 30 mL via ORAL

## 2019-09-29 MED ORDER — DEXTROSE 5% WITH SODIUM BICARB IV INFUSION
INTRAVENOUS | 0 refills | Status: DC
Start: 2019-09-29 — End: 2019-09-30
  Administered 2019-09-29 (×2): 1150.000 mL via INTRAVENOUS

## 2019-09-29 MED ORDER — FUROSEMIDE 10 MG/ML IJ SOLN
80 mg | Freq: Once | INTRAVENOUS | 0 refills | Status: DC
Start: 2019-09-29 — End: 2019-09-29

## 2019-09-29 MED ORDER — SW WITH SODIUM BICARBONATE IV INFUSION
150 meq | INTRAVENOUS | 0 refills | Status: CP
Start: 2019-09-29 — End: ?
  Administered 2019-09-29 (×2): 150 meq via INTRAVENOUS

## 2019-09-29 MED ORDER — INSULIN (REGULAR) BOLUS FOR CONTINUOUS INFUSION
.1 [IU]/kg | Freq: Once | INTRAVENOUS | 0 refills | Status: CP
Start: 2019-09-29 — End: ?

## 2019-09-29 MED ORDER — PANTOPRAZOLE 40 MG IV SOLR
40 mg | Freq: Two times a day (BID) | INTRAVENOUS | 0 refills | Status: DC
Start: 2019-09-29 — End: 2019-10-05
  Administered 2019-09-29 – 2019-10-05 (×13): 40 mg via INTRAVENOUS

## 2019-09-29 MED ORDER — INSULIN REGULAR IN 0.9 % NACL 100 UNIT/100 ML (1 UNIT/ML) IV SOLN
1-32 [IU]/h | INTRAVENOUS | 0 refills | Status: AC
Start: 2019-09-29 — End: ?
  Administered 2019-09-29: 14:00:00 3 [IU]/h via INTRAVENOUS
  Administered 2019-09-30: 02:00:00 4 [IU]/h via INTRAVENOUS

## 2019-09-29 MED ORDER — ALPRAZOLAM 0.5 MG PO TAB
.5 mg | Freq: Once | ORAL | 0 refills | Status: CP
Start: 2019-09-29 — End: ?
  Administered 2019-09-29: 22:00:00 0.5 mg via ORAL

## 2019-09-29 MED ORDER — SW WITH SODIUM BICARBONATE IV INFUSION
150 meq | INTRAVENOUS | 0 refills | Status: DC
Start: 2019-09-29 — End: 2019-09-29
  Administered 2019-09-29 (×4): 150 meq via INTRAVENOUS

## 2019-09-29 MED ORDER — DEXTROSE 5%-0.45% SODIUM CHLORIDE IV SOLP
INTRAVENOUS | 0 refills | Status: DC
Start: 2019-09-29 — End: 2019-09-30
  Administered 2019-09-30 (×2): 1000.000 mL via INTRAVENOUS

## 2019-09-29 MED ADMIN — SODIUM CHLORIDE 0.9 % IV SOLP [27838]: 20 mL | INTRAVENOUS | @ 11:00:00 | Stop: 2019-09-29 | NDC 00338004904

## 2019-09-29 NOTE — Progress Notes
Patient arrived to room # [facility]) via cart accompanied by transport. Patient transferred to the bed with assistance. Bedside safety checks completed. Initial patient assessment completed. Refer to flowsheet for details.    Admission skin assessment completed with: Fernanda,RN    Pressure injury present on arrival?: No    1. Head/Face/Neck: No  2. Trunk/Back: No  3. Upper Extremities: No  4. Lower Extremities: No  5. Pelvic/Coccyx: No  6. Assessed for device associated injury? Yes  7. Malnutrition Screening Tool (Nursing Nutrition Assessment) Completed? Yes    See Doc Flowsheet for additional wound details.     INTERVENTIONS:   Pt presented with a skin tear on her left forearm

## 2019-09-29 NOTE — H&P (View-Only)
Name:  Brandy Joseph                                             MRN:  [id number]   Admission Date:  09/28/2019                     Assessment/Plan:      39 year old female with Type 1 DM since the age of 2 on insulin with micro and macrovascular complication including CVA with short memory impairment, uncontrolled HTN, not on AC, CKD not on dialysis transferred to [place] on 3/25 for colitis, worsening anemia s/p 2 units pRBC , AKI on CKD. Pt says she is planned to have combined kidney and pancreas transplantation.      Colitis  - pt was admitted 3/22 with diarrhea x 1 month, dehydration; pt denies abdominal pain/diarrhea on admission  - Occult + stool at OSH  - as per record stool studies were negative  - CT abd/pelvis at OSH with diffuse fluid/gas distention of colon- colitis/enteritis  - started at OSH on IV ABX-Cipro, Flagyl - continue  - start PPI  - GI consulted    Acute on chronic anemia  - pt says she had had bloody BMs, last one yesterday, never had a colonoscopy  - PTA Occult + stool, her Hb dropped to 6.8 (10) and she received 2 units pRBC on 3/25 prior to transfer  - chronic anemia - related to iron deficiency and CKD  - blood transfusion consent form signed on admission  - CTM, keep Hb > 7    AKI on CKD  - follows w/ nephrology as opt  - at OSH: Cr 4.64 on admit--3.88 today, UA negative  - Baseline Creatinine close to 2.0-2.2.   - Long hx of diabetes and hypertension.  - Hx of proteinuria back in 2009.   - plan for combined kidney and pancreas transplantation  - Avoid NSAIDs  - nephrology consulted    H/o stroke  - she had a stroke last 03/2018, SBP was around 200's during admission. Workup at that time included brain imaging and showed ICH with IVH extension.  - Overall short term memory seems more affected. She is normally able to recognize family and close friends. She does not need help with basic ADLs.  - pt denies any neurologic symptoms on admission    HTN  - On amlodipine, carvedilol, clonidine - continue  - add hydralazine prn    Uncontrolled DM type 1  Complicated with neuropathy and diabetic retinopathy  - Hb A1C on 09/05/19 was 9.2  - she says she was on insulin pump over the last 3 days but she can not remember the setting  - start LDCF, pt agrees to turn off the pump for now, consulted endocrinology     Neurogenic Bladder  - Requiring intermittent catheterization    Full code  SCDs  Inpatient  CLD for now    Plan of care was discussed in detail with the patient, her significant other at bedside and RN  Medication side effects explained  Imaging findings explained, rec-ed outpatient follow up with her pcp to address those findings  They verbalized understanding and an agreement with the above plan  ______________________________________________________________________________    Primary Care Physician: Rockwell Germany     Chief Complaint:     39 year old  female with Type 1 DM since the age of 2 on insulin with micro and macrovascular complication including CVA with short memory impairment, uncontrolled HTN, not on AC, CKD not on dialysis transferred to [place] on 3/25 for colitis, worsening anemia s/p 2 units pRBC PTA, AKI on CKD. Pt says she is planned to have combined kidney and pancreas transplantation. On admission patient denies any type of pain, active bleeding, nausea/vomiting, fever/chills, LOC, focal weakness, CP/SOB, cough/palpitations. She says she has been tolerating liquids. Last BM was yesterday. Pt has difficulty with her short term memory. Patient says she has been following with nephrology and endocrinology at [facility]. Other ROS reviewed and negative.    History of Present Illness: Brandy Joseph is a 39 y.o. female with PMH of     Medical History:   Diagnosis Date   ? DM (diabetes mellitus) (HCC)    ? Hypertension    ? Kidney disease    ? Memory loss    ? Stroke [facility])      Surgical History:   Procedure Laterality Date   ? ESOPHAGOGASTRODUODENOSCOPY WITH BIOPSY - FLEXIBLE N/A 03/29/2018    Performed by Virgina Organ, MD at [facility] ENDO   ? GALLBLADDER SURGERY     ? HX CESAREAN SECTION       Family History   Problem Relation Age of Onset   ? Stroke Mother    ? Diabetes Paternal Grandmother    ? Heart problem Paternal Grandmother    ? Tumor Maternal Grandmother         brain   ? None Reported Son      Social History     Socioeconomic History   ? Marital status: Divorced     Spouse name: Not on file   ? Number of children: 1   ? Years of education: Not on file   ? Highest education level: Associate degree: occupational, Scientist, product/process development, or vocational program   Occupational History   ? Not on file   Tobacco Use   ? Smoking status: Never Smoker   ? Smokeless tobacco: Never Used   Substance and Sexual Activity   ? Alcohol use: Yes     Alcohol/week: 0.0 standard drinks     Frequency: Never     Drinks per session: 1 or 2     Binge frequency: Never     Comment: maybe 6 drinks a month   ? Drug use: Never   ? Sexual activity: Yes     Partners: Male     Birth control/protection: None, Surgical   Other Topics Concern   ? Not on file   Social History Narrative   ? Not on file      Immunizations (includes history and patient reported):   There is no immunization history on file for this patient.        Allergies:  Cephalexin, Ketorolac, and Sulfa (sulfonamide antibiotics)    Medications:  Medications Prior to Admission   Medication Sig   ? acetaminophen (TYLENOL) 325 mg tablet Take two tablets by mouth every 4 hours as needed.   ? amitriptyline (ELAVIL) 25 mg tablet Take one tablet by mouth at bedtime daily. Indications: Migraine Prevention   ? amLODIPine (NORVASC) 10 mg tablet Take one tablet by mouth daily.   ? bisacodyL (DULCOLAX) 5 mg tablet Take 5 mg by mouth every 24 hours as needed for Constipation.   ? buPROPion XL (WELLBUTRIN XL) 150 mg tablet Take 150 mg by mouth every morning.  Do not crush or chew.   ? carvedilol (COREG) 25 mg tablet Take one tablet by mouth twice daily. Take with food.   ? cetirizine (ZYRTEC) 10 mg tablet Take 10 mg by mouth daily as needed.   ? cloNIDine (CATAPRESS) 0.2 mg tablet Take one tablet by mouth three times daily.   ? docusate (COLACE) 100 mg capsule Take 100 mg by mouth twice daily as needed.   ? FLUoxetine (PROZAC) 10 mg capsule Take 10 mg by mouth daily.   ? furosemide (LASIX) 40 mg tablet Take one tablet by mouth daily.   ? HYDROcodone/acetaminophen (NORCO) 7.5/325 mg tablet Take 1 tablet by mouth every 6 hours as needed for Pain   ? insulin degludec (TRESIBA FLEXTOUCH) 100 unit/mL (3 mL) injection pen Inject 9 Units under the skin daily.   ? insulin lispro(+) (HUMALOG) 100 unit/mL injection Use 1:9 insulin to carb ratio. Maximum of 50 units daily.   ? Insulin Syringe-Needle U-100 0.5 mL 31 gauge x 5/16 syrg Use with insulin injections with meals and at bedtime   ? lisinopriL (ZESTRIL) 10 mg tablet Take 10 mg by mouth daily.   ? oxyCODONE (ROXICODONE) 5 mg tablet Take 5-10 mg by mouth every 4 hours as needed for Pain (migraine)     ? pantoprazole DR (PROTONIX) 40 mg tablet Take one tablet by mouth daily.   ? polyethylene glycol 3350 (MIRALAX) 17 g packet Take one packet by mouth twice daily.   ? tamsulosin (FLOMAX) 0.4 mg capsule Take one capsule by mouth daily after breakfast. Do not crush, chew or open capsules. Take 30 minutes following the same meal each day.     Review of Systems:  A comprehensive  12 point review of organ systems reviewed and was negative except for the ones mentioned in HOPI    Physical Exam:  Vital Signs: Last Filed In 24 Hours Vital Signs: 24 Hour Range                General:  Alert, awake, oriented x 3 , cooperative, no distress  Head:  Normocephalic, without obvious abnormality, atraumatic  Eyes:  Conjunctivae/corneas clear   Nose: Nares normal. Mucosa normal.  No drainage or sinus tenderness  Throat: Lips, mucosa and tongue normal  Neck:    Supple, symmetrical, trachea midline  Lungs:  Clear to auscultation bilaterally  Heart:   Regular rate and rhythm, S1, S2 normal, no murmur  Abdomen:  Soft, non-tender.  Bowel sounds normal.    Extremities: Extremities normal, atraumatic, no cyanosis or edema  Skin: Skin color, texture, turgor normal.    Neurologic: Non focal grossly    Lab/Radiology/Other Diagnostic Tests:  24-hour labs:  No results found for this visit on 09/28/19 (from the past 24 hour(s)).     Pertinent radiology reviewed.    No results found.

## 2019-09-29 NOTE — Progress Notes
Blood cultures and labs drawn peripherally, right arm, by IV therapy using sono.

## 2019-09-30 MED ORDER — ACETAMINOPHEN/LIDOCAINE/ANTACID DS(#) 1:1:3  PO SUSP
30 mL | Freq: Once | ORAL | 0 refills | Status: CP
Start: 2019-09-30 — End: ?
  Administered 2019-10-01: 03:00:00 30 mL via ORAL

## 2019-09-30 MED ORDER — INSULIN GLARGINE 100 UNIT/ML (3 ML) SC INJ PEN
30 [IU] | Freq: Every evening | SUBCUTANEOUS | 0 refills | Status: DC
Start: 2019-09-30 — End: 2019-10-01
  Administered 2019-10-01: 03:00:00 10 [IU] via SUBCUTANEOUS

## 2019-09-30 MED ORDER — FUROSEMIDE 10 MG/ML IJ SOLN
40 mg | Freq: Every day | INTRAVENOUS | 0 refills | Status: DC
Start: 2019-09-30 — End: 2019-10-01
  Administered 2019-09-30 – 2019-10-01 (×2): 40 mg via INTRAVENOUS

## 2019-09-30 MED ORDER — INSULIN GLARGINE 100 UNIT/ML (3 ML) SC INJ PEN
30 [IU] | Freq: Every day | SUBCUTANEOUS | 0 refills | Status: DC
Start: 2019-09-30 — End: 2019-09-30

## 2019-09-30 MED ORDER — OXYCODONE 5 MG PO TAB
5 mg | Freq: Once | ORAL | 0 refills | Status: CP
Start: 2019-09-30 — End: ?
  Administered 2019-10-01: 02:00:00 5 mg via ORAL

## 2019-09-30 MED ORDER — INSULIN ASPART 100 UNIT/ML SC FLEXPEN
0-12 [IU] | Freq: Before meals | SUBCUTANEOUS | 0 refills | Status: DC
Start: 2019-09-30 — End: 2019-10-03

## 2019-09-30 MED ORDER — INSULIN ASPART 100 UNIT/ML SC FLEXPEN
1-25 [IU] | Freq: Three times a day (TID) | SUBCUTANEOUS | 0 refills | Status: DC
Start: 2019-09-30 — End: 2019-10-14
  Administered 2019-09-30: 20:00:00 2 [IU] via SUBCUTANEOUS

## 2019-09-30 MED ADMIN — DEXTROSE 50 % IN WATER (D50W) IV SOLP [86270]: 25 mL | INTRAVENOUS | @ 01:00:00 | Stop: 2019-09-30 | NDC 00409664816

## 2019-10-01 ENCOUNTER — Inpatient Hospital Stay: Admit: 2019-10-01 | Discharge: 2019-10-01 | Payer: Commercial Managed Care - Pharmacy Benefit Manager

## 2019-10-01 MED ORDER — PEG 3350-ELECTROLYTES 236-22.74-6.74 -5.86 GRAM PO SOLR
4 L | Freq: Once | ORAL | 0 refills | Status: CP
Start: 2019-10-01 — End: ?
  Administered 2019-10-01: 23:00:00 4 L via ORAL

## 2019-10-01 MED ORDER — FUROSEMIDE 10 MG/ML IJ SOLN
40 mg | Freq: Two times a day (BID) | INTRAVENOUS | 0 refills | Status: DC
Start: 2019-10-01 — End: 2019-10-02
  Administered 2019-10-01 – 2019-10-02 (×2): 40 mg via INTRAVENOUS

## 2019-10-01 MED ORDER — INSULIN ASPART 100 UNIT/ML SC FLEXPEN
6 [IU] | Freq: Once | SUBCUTANEOUS | 0 refills | Status: CP
Start: 2019-10-01 — End: ?

## 2019-10-01 MED ORDER — ERGOCALCIFEROL (VITAMIN D2) 1,250 MCG (50,000 UNIT) PO CAP
50000 [IU] | ORAL | 0 refills | Status: DC
Start: 2019-10-01 — End: 2019-10-27
  Administered 2019-10-02 – 2019-10-23 (×4): 50000 [IU] via ORAL

## 2019-10-01 MED ORDER — INSULIN GLARGINE 100 UNIT/ML (3 ML) SC INJ PEN
13 [IU] | Freq: Every evening | SUBCUTANEOUS | 0 refills | Status: DC
Start: 2019-10-01 — End: 2019-10-04

## 2019-10-02 ENCOUNTER — Encounter: Admit: 2019-10-02 | Discharge: 2019-10-02 | Payer: Commercial Managed Care - Pharmacy Benefit Manager

## 2019-10-02 ENCOUNTER — Inpatient Hospital Stay: Admit: 2019-10-02 | Discharge: 2019-10-02 | Payer: Commercial Managed Care - Pharmacy Benefit Manager

## 2019-10-02 MED ORDER — PEG 3350-ELECTROLYTES 236-22.74-6.74 -5.86 GRAM PO SOLR
4 L | ORAL | 0 refills | Status: DC
Start: 2019-10-02 — End: 2019-10-09
  Administered 2019-10-03: 03:00:00 4 L via ORAL

## 2019-10-02 MED ORDER — FUROSEMIDE 10 MG/ML IJ SOLN
60 mg | Freq: Two times a day (BID) | INTRAVENOUS | 0 refills | Status: DC
Start: 2019-10-02 — End: 2019-10-04
  Administered 2019-10-02 – 2019-10-04 (×4): 60 mg via INTRAVENOUS

## 2019-10-02 MED ORDER — BISACODYL 5 MG PO TBEC
10 mg | Freq: Once | ORAL | 0 refills | Status: AC | PRN
Start: 2019-10-02 — End: ?

## 2019-10-02 MED ORDER — RP DX TC-99M MAA MCI
5 | Freq: Once | INTRAVENOUS | 0 refills | Status: CP
Start: 2019-10-02 — End: ?
  Administered 2019-10-02: 21:00:00 5 via INTRAVENOUS

## 2019-10-02 MED ORDER — NITROGLYCERIN 0.4 MG SL SUBL
.4 mg | SUBLINGUAL | 0 refills | Status: DC | PRN
Start: 2019-10-02 — End: 2019-10-27
  Administered 2019-10-02: 22:00:00 0.4 mg via SUBLINGUAL

## 2019-10-02 MED ORDER — ROSUVASTATIN 10 MG PO TAB
20 mg | Freq: Every day | ORAL | 0 refills | Status: DC
Start: 2019-10-02 — End: 2019-10-10
  Administered 2019-10-02 – 2019-10-10 (×9): 20 mg via ORAL

## 2019-10-02 MED ORDER — PEG 3350-ELECTROLYTES 236-22.74-6.74 -5.86 GRAM PO SOLR
2 L | ORAL | 0 refills | Status: DC | PRN
Start: 2019-10-02 — End: 2019-10-09

## 2019-10-02 MED ADMIN — NITROGLYCERIN 0.4 MG SL SUBL [5604]: 0.4 mg | SUBLINGUAL | @ 15:00:00 | Stop: 2019-10-02 | NDC 59762330403

## 2019-10-03 ENCOUNTER — Inpatient Hospital Stay: Admit: 2019-10-03 | Discharge: 2019-10-03 | Payer: Commercial Managed Care - Pharmacy Benefit Manager

## 2019-10-03 ENCOUNTER — Encounter: Admit: 2019-10-03 | Discharge: 2019-10-03 | Payer: Commercial Managed Care - Pharmacy Benefit Manager

## 2019-10-03 DIAGNOSIS — E119 Type 2 diabetes mellitus without complications: Secondary | ICD-10-CM

## 2019-10-03 DIAGNOSIS — I639 Cerebral infarction, unspecified: Secondary | ICD-10-CM

## 2019-10-03 DIAGNOSIS — I10 Essential (primary) hypertension: Secondary | ICD-10-CM

## 2019-10-03 DIAGNOSIS — N289 Disorder of kidney and ureter, unspecified: Secondary | ICD-10-CM

## 2019-10-03 DIAGNOSIS — R413 Other amnesia: Secondary | ICD-10-CM

## 2019-10-03 MED ORDER — REGADENOSON 0.4 MG/5 ML IV SYRG
.4 mg | Freq: Once | INTRAVENOUS | 0 refills | Status: CP
Start: 2019-10-03 — End: ?
  Administered 2019-10-03: 13:00:00 0.4 mg via INTRAVENOUS

## 2019-10-03 MED ORDER — SODIUM CHLORIDE 0.9 % IV SOLP
250 mL | INTRAVENOUS | 0 refills | Status: AC | PRN
Start: 2019-10-03 — End: ?

## 2019-10-03 MED ORDER — POLYETHYLENE GLYCOL 3350 238 GRAM PO PWPK
238 g | Freq: Once | ORAL | 0 refills | Status: CP
Start: 2019-10-03 — End: ?
  Administered 2019-10-03: 22:00:00 238 g via ORAL

## 2019-10-03 MED ORDER — NITROGLYCERIN 0.4 MG SL SUBL
.4 mg | SUBLINGUAL | 0 refills | Status: DC | PRN
Start: 2019-10-03 — End: 2019-10-27

## 2019-10-03 MED ORDER — EUCALYPTUS-MENTHOL MM LOZG
1 | Freq: Once | ORAL | 0 refills | Status: AC | PRN
Start: 2019-10-03 — End: ?

## 2019-10-03 MED ORDER — INSULIN ASPART 100 UNIT/ML SC FLEXPEN
0-6 [IU] | Freq: Before meals | SUBCUTANEOUS | 0 refills | Status: DC
Start: 2019-10-03 — End: 2019-10-08

## 2019-10-03 MED ORDER — ALBUTEROL SULFATE 90 MCG/ACTUATION IN HFAA
2 | RESPIRATORY_TRACT | 0 refills | Status: DC | PRN
Start: 2019-10-03 — End: 2019-10-05

## 2019-10-03 MED ORDER — PERFLUTREN LIPID MICROSPHERES 1.1 MG/ML IV SUSP
1-20 mL | Freq: Once | INTRAVENOUS | 0 refills | Status: CP | PRN
Start: 2019-10-03 — End: ?
  Administered 2019-10-03: 20:00:00 3 mL via INTRAVENOUS

## 2019-10-03 MED ORDER — AMINOPHYLLINE 500 MG/20 ML IV SOLN
50 mg | INTRAVENOUS | 0 refills | Status: AC | PRN
Start: 2019-10-03 — End: ?
  Administered 2019-10-03: 13:00:00 50 mg via INTRAVENOUS

## 2019-10-04 ENCOUNTER — Encounter: Admit: 2019-10-04 | Discharge: 2019-10-04 | Payer: Commercial Managed Care - Pharmacy Benefit Manager

## 2019-10-04 DIAGNOSIS — E119 Type 2 diabetes mellitus without complications: Secondary | ICD-10-CM

## 2019-10-04 DIAGNOSIS — N289 Disorder of kidney and ureter, unspecified: Secondary | ICD-10-CM

## 2019-10-04 DIAGNOSIS — I10 Essential (primary) hypertension: Secondary | ICD-10-CM

## 2019-10-04 DIAGNOSIS — R413 Other amnesia: Secondary | ICD-10-CM

## 2019-10-04 DIAGNOSIS — I639 Cerebral infarction, unspecified: Secondary | ICD-10-CM

## 2019-10-04 MED ORDER — LIDOCAINE (PF) 200 MG/10 ML (2 %) IJ SYRG
0 refills | Status: DC
Start: 2019-10-04 — End: 2019-10-04
  Administered 2019-10-04: 21:00:00 80 mg via INTRAVENOUS

## 2019-10-04 MED ORDER — FENTANYL CITRATE (PF) 50 MCG/ML IJ SOLN
50 ug | INTRAVENOUS | 0 refills | Status: CN | PRN
Start: 2019-10-04 — End: ?

## 2019-10-04 MED ORDER — FENTANYL CITRATE (PF) 50 MCG/ML IJ SOLN
25 ug | INTRAVENOUS | 0 refills | Status: CN | PRN
Start: 2019-10-04 — End: ?

## 2019-10-04 MED ORDER — INSULIN PUMP - LISPRO
Freq: Three times a day (TID) | SUBCUTANEOUS | 0 refills | Status: DC | PRN
Start: 2019-10-04 — End: 2019-10-05

## 2019-10-04 MED ORDER — PROPOFOL 10 MG/ML IV EMUL 20 ML (INFUSION)(AM)(OR)
INTRAVENOUS | 0 refills | Status: DC
Start: 2019-10-04 — End: 2019-10-04
  Administered 2019-10-04: 21:00:00 125 ug/kg/min via INTRAVENOUS

## 2019-10-04 MED ORDER — LACTATED RINGERS IV SOLP
0 refills | Status: DC
Start: 2019-10-04 — End: 2019-10-04
  Administered 2019-10-04: 21:00:00 via INTRAVENOUS

## 2019-10-04 MED ORDER — PROCHLORPERAZINE EDISYLATE 5 MG/ML IJ SOLN
10 mg | INTRAVENOUS | 0 refills | Status: DC | PRN
Start: 2019-10-04 — End: 2019-10-27
  Administered 2019-10-05 – 2019-10-18 (×10): 10 mg via INTRAVENOUS

## 2019-10-04 MED ORDER — METOCLOPRAMIDE HCL 5 MG/ML IJ SOLN
10 mg | Freq: Once | INTRAVENOUS | 0 refills | Status: CN | PRN
Start: 2019-10-04 — End: ?

## 2019-10-04 MED ORDER — DIPHENHYDRAMINE HCL 50 MG/ML IJ SOLN
25 mg | Freq: Once | INTRAVENOUS | 0 refills | Status: CN | PRN
Start: 2019-10-04 — End: ?

## 2019-10-04 MED ORDER — FENTANYL CITRATE (PF) 50 MCG/ML IJ SOLN
0 refills | Status: DC
Start: 2019-10-04 — End: 2019-10-04
  Administered 2019-10-04 (×2): 50 ug via INTRAVENOUS

## 2019-10-04 MED ORDER — PROMETHAZINE 25 MG/ML IJ SOLN
6.25 mg | INTRAVENOUS | 0 refills | Status: CN | PRN
Start: 2019-10-04 — End: ?

## 2019-10-04 MED ORDER — INSULIN GLARGINE 100 UNIT/ML (3 ML) SC INJ PEN
12 [IU] | Freq: Every evening | SUBCUTANEOUS | 0 refills | Status: DC
Start: 2019-10-04 — End: 2019-10-05

## 2019-10-04 MED ORDER — FUROSEMIDE 10 MG/ML IJ SOLN
80 mg | Freq: Two times a day (BID) | INTRAVENOUS | 0 refills | Status: DC
Start: 2019-10-04 — End: 2019-10-05
  Administered 2019-10-04: 23:00:00 80 mg via INTRAVENOUS

## 2019-10-04 MED ORDER — IRON SUCROSE 300 MG IRON/15 ML IV SOLN
300 mg | Freq: Every day | INTRAVENOUS | 0 refills | Status: CP
Start: 2019-10-04 — End: ?
  Administered 2019-10-04 – 2019-10-06 (×6): 300 mg via INTRAVENOUS

## 2019-10-04 MED ORDER — FLUCONAZOLE IN NACL (ISO-OSM) 200 MG/100 ML IV PGBK
200 mg | INTRAVENOUS | 0 refills | Status: DC
Start: 2019-10-04 — End: 2019-10-05
  Administered 2019-10-05: 04:00:00 200 mg via INTRAVENOUS

## 2019-10-04 MED ORDER — PROPOFOL INJ 10 MG/ML IV VIAL
0 refills | Status: DC
Start: 2019-10-04 — End: 2019-10-04
  Administered 2019-10-04: 21:00:00 80 mg via INTRAVENOUS

## 2019-10-05 ENCOUNTER — Encounter: Admit: 2019-10-05 | Discharge: 2019-10-05 | Payer: Commercial Managed Care - Pharmacy Benefit Manager

## 2019-10-05 DIAGNOSIS — R413 Other amnesia: Secondary | ICD-10-CM

## 2019-10-05 DIAGNOSIS — E119 Type 2 diabetes mellitus without complications: Secondary | ICD-10-CM

## 2019-10-05 DIAGNOSIS — N289 Disorder of kidney and ureter, unspecified: Secondary | ICD-10-CM

## 2019-10-05 DIAGNOSIS — I10 Essential (primary) hypertension: Secondary | ICD-10-CM

## 2019-10-05 DIAGNOSIS — I639 Cerebral infarction, unspecified: Secondary | ICD-10-CM

## 2019-10-05 MED ORDER — SODIUM CHLORIDE 0.9 % IV SOLP
INTRAVENOUS | 0 refills | Status: AC
Start: 2019-10-05 — End: ?
  Administered 2019-10-05 – 2019-10-06 (×2): 1000.000 mL via INTRAVENOUS

## 2019-10-05 MED ORDER — PYRIDOXINE (VITAMIN B6) 100 MG/ML IJ SOLN
20 mg | Freq: Every day | INTRAVENOUS | 0 refills | Status: DC
Start: 2019-10-05 — End: 2019-10-10
  Administered 2019-10-05 – 2019-10-10 (×6): 20 mg via INTRAVENOUS

## 2019-10-05 MED ORDER — INSULIN GLARGINE 100 UNIT/ML (3 ML) SC INJ PEN
12 [IU] | Freq: Every day | SUBCUTANEOUS | 0 refills | Status: DC
Start: 2019-10-05 — End: 2019-10-07

## 2019-10-05 MED ORDER — FLUCONAZOLE 200 MG PO TAB
200 mg | Freq: Every day | ORAL | 0 refills | Status: DC
Start: 2019-10-05 — End: 2019-10-08
  Administered 2019-10-05 – 2019-10-08 (×4): 200 mg via ORAL

## 2019-10-05 MED ORDER — PANTOPRAZOLE 20 MG PO TBEC
20 mg | Freq: Every day | ORAL | 0 refills | Status: DC
Start: 2019-10-05 — End: 2019-10-27
  Administered 2019-10-06 – 2019-10-27 (×21): 20 mg via ORAL

## 2019-10-05 MED ORDER — INSULIN GLARGINE 100 UNIT/ML (3 ML) SC INJ PEN
12 [IU] | Freq: Every evening | SUBCUTANEOUS | 0 refills | Status: DC
Start: 2019-10-05 — End: 2019-10-05

## 2019-10-06 ENCOUNTER — Ambulatory Visit: Admit: 2019-10-06 | Discharge: 2019-10-06 | Payer: Commercial Managed Care - Pharmacy Benefit Manager

## 2019-10-06 DIAGNOSIS — K559 Vascular disorder of intestine, unspecified: Secondary | ICD-10-CM

## 2019-10-06 MED ORDER — BUMETANIDE 0.25 MG/ML IJ SOLN
2 mg | Freq: Two times a day (BID) | INTRAVENOUS | 0 refills | Status: DC
Start: 2019-10-06 — End: 2019-10-06

## 2019-10-06 MED ORDER — HEPARIN, PORCINE (PF) 5,000 UNIT/0.5 ML IJ SYRG
5000 [IU] | SUBCUTANEOUS | 0 refills | Status: DC
Start: 2019-10-06 — End: 2019-10-08
  Administered 2019-10-06 – 2019-10-08 (×6): 5000 [IU] via SUBCUTANEOUS

## 2019-10-06 MED ORDER — LACTATED RINGERS IV SOLP
INTRAVENOUS | 0 refills | Status: CN
Start: 2019-10-06 — End: ?

## 2019-10-06 MED ORDER — BUMETANIDE 0.25 MG/ML IJ SOLN
1 mg | Freq: Two times a day (BID) | INTRAVENOUS | 0 refills | Status: DC
Start: 2019-10-06 — End: 2019-10-06

## 2019-10-06 MED ORDER — FUROSEMIDE IVPB (100 MG - 120 MG DOSES)
120 mg | Freq: Two times a day (BID) | INTRAVENOUS | 0 refills | Status: DC
Start: 2019-10-06 — End: 2019-10-07
  Administered 2019-10-06 – 2019-10-07 (×5): 120 mg via INTRAVENOUS

## 2019-10-06 MED ORDER — METOLAZONE 5 MG PO TAB
5 mg | Freq: Every day | ORAL | 0 refills | Status: DC
Start: 2019-10-06 — End: 2019-10-15
  Administered 2019-10-06 – 2019-10-14 (×9): 5 mg via ORAL

## 2019-10-06 MED ORDER — ALBUMIN, HUMAN 25 % IV SOLP
25 g | Freq: Two times a day (BID) | INTRAVENOUS | 0 refills | Status: DC
Start: 2019-10-06 — End: 2019-10-07
  Administered 2019-10-06 – 2019-10-07 (×3): 25 g via INTRAVENOUS

## 2019-10-06 MED ORDER — METRONIDAZOLE IN NACL (ISO-OS) 500 MG/100 ML IV PGBK
500 mg | INTRAVENOUS | 0 refills | Status: CP
Start: 2019-10-06 — End: ?
  Administered 2019-10-06 – 2019-10-11 (×15): 500 mg via INTRAVENOUS

## 2019-10-06 MED ORDER — POTASSIUM CHLORIDE 20 MEQ PO TBTQ
40 meq | Freq: Once | ORAL | 0 refills | Status: DC
Start: 2019-10-06 — End: 2019-10-07

## 2019-10-06 MED ORDER — CIPROFLOXACIN HCL 500 MG PO TAB
500 mg | Freq: Every day | ORAL | 0 refills | Status: CP
Start: 2019-10-06 — End: ?
  Administered 2019-10-07 – 2019-10-11 (×5): 500 mg via ORAL

## 2019-10-07 MED ORDER — FUROSEMIDE 200 MG IN D5W 100 ML IV DRIP
40 mg/h | INTRAVENOUS | 0 refills | Status: DC
Start: 2019-10-07 — End: 2019-10-13
  Administered 2019-10-07 – 2019-10-13 (×50): 40 mg/h via INTRAVENOUS

## 2019-10-07 MED ORDER — INSULIN GLARGINE 100 UNIT/ML (3 ML) SC INJ PEN
13 [IU] | Freq: Every day | SUBCUTANEOUS | 0 refills | Status: DC
Start: 2019-10-07 — End: 2019-10-09

## 2019-10-07 MED ORDER — FUROSEMIDE IVPB (120 MG - 160 MG DOSES)
160 mg | Freq: Once | INTRAVENOUS | 0 refills | Status: CP
Start: 2019-10-07 — End: ?
  Administered 2019-10-07 (×2): 160 mg via INTRAVENOUS

## 2019-10-07 MED ORDER — INSULIN GLARGINE 100 UNIT/ML (3 ML) SC INJ PEN
1 [IU] | Freq: Once | SUBCUTANEOUS | 0 refills | Status: CP
Start: 2019-10-07 — End: ?

## 2019-10-07 MED ORDER — SODIUM BICARBONATE 650 MG PO TAB
1300 mg | Freq: Two times a day (BID) | ORAL | 0 refills | Status: DC
Start: 2019-10-07 — End: 2019-10-10
  Administered 2019-10-08 – 2019-10-10 (×6): 1300 mg via ORAL

## 2019-10-08 ENCOUNTER — Inpatient Hospital Stay: Admit: 2019-10-08 | Discharge: 2019-10-08 | Payer: Commercial Managed Care - Pharmacy Benefit Manager

## 2019-10-08 MED ORDER — INSULIN REGULAR IN 0.9 % NACL 100 UNIT/100 ML (1 UNIT/ML) IV SOLN
1-32 [IU]/h | INTRAVENOUS | 0 refills | Status: AC
Start: 2019-10-08 — End: ?
  Administered 2019-10-08: 15:00:00 4 [IU]/h via INTRAVENOUS

## 2019-10-08 MED ORDER — INSULIN ASPART 100 UNIT/ML SC FLEXPEN
0-12 [IU] | Freq: Before meals | SUBCUTANEOUS | 0 refills | Status: DC
Start: 2019-10-08 — End: 2019-10-08

## 2019-10-08 MED ORDER — TRIMETHOBENZAMIDE 100 MG/ML IM SOLN
200 mg | Freq: Once | INTRAMUSCULAR | 0 refills | Status: CP
Start: 2019-10-08 — End: ?
  Administered 2019-10-08: 11:00:00 200 mg via INTRAMUSCULAR

## 2019-10-08 MED ORDER — FLUCONAZOLE 200 MG PO TAB
100 mg | Freq: Every day | ORAL | 0 refills | Status: DC
Start: 2019-10-08 — End: 2019-10-18
  Administered 2019-10-09 – 2019-10-18 (×8): 100 mg via ORAL

## 2019-10-09 ENCOUNTER — Inpatient Hospital Stay: Admit: 2019-10-09 | Discharge: 2019-10-09 | Payer: Commercial Managed Care - Pharmacy Benefit Manager

## 2019-10-09 ENCOUNTER — Encounter: Admit: 2019-10-09 | Discharge: 2019-10-09 | Payer: Commercial Managed Care - Pharmacy Benefit Manager

## 2019-10-09 DIAGNOSIS — I639 Cerebral infarction, unspecified: Secondary | ICD-10-CM

## 2019-10-09 DIAGNOSIS — N289 Disorder of kidney and ureter, unspecified: Secondary | ICD-10-CM

## 2019-10-09 DIAGNOSIS — I10 Essential (primary) hypertension: Secondary | ICD-10-CM

## 2019-10-09 DIAGNOSIS — R413 Other amnesia: Secondary | ICD-10-CM

## 2019-10-09 DIAGNOSIS — E119 Type 2 diabetes mellitus without complications: Secondary | ICD-10-CM

## 2019-10-09 MED ORDER — SODIUM CHLORIDE 0.9 % IV SOLP
300 mL | INTRAVENOUS | 0 refills | Status: AC | PRN
Start: 2019-10-09 — End: ?

## 2019-10-09 MED ORDER — INSULIN GLARGINE 100 UNIT/ML (3 ML) SC INJ PEN
13 [IU] | Freq: Every evening | SUBCUTANEOUS | 0 refills | Status: DC
Start: 2019-10-09 — End: 2019-10-10

## 2019-10-09 MED ORDER — OXYCODONE 5 MG PO TAB
5 mg | ORAL | 0 refills | Status: DC | PRN
Start: 2019-10-09 — End: 2019-10-18
  Administered 2019-10-09 – 2019-10-18 (×8): 5 mg via ORAL

## 2019-10-09 MED ORDER — INSULIN ASPART 100 UNIT/ML SC FLEXPEN
0-12 [IU] | Freq: Every day | SUBCUTANEOUS | 0 refills | Status: DC
Start: 2019-10-09 — End: 2019-10-19

## 2019-10-09 MED ORDER — ALPRAZOLAM 0.5 MG PO TAB
.5 mg | Freq: Once | ORAL | 0 refills | Status: CP
Start: 2019-10-09 — End: ?
  Administered 2019-10-09: 17:00:00 0.5 mg via ORAL

## 2019-10-09 MED ORDER — HEPARIN, PORCINE (PF) 5,000 UNIT/0.5 ML IJ SYRG
5000 [IU] | SUBCUTANEOUS | 0 refills | Status: DC
Start: 2019-10-09 — End: 2019-10-09

## 2019-10-09 MED ORDER — HEPARIN, PORCINE (PF) 5,000 UNIT/0.5 ML IJ SYRG
5000 [IU] | SUBCUTANEOUS | 0 refills | Status: DC
Start: 2019-10-09 — End: 2019-10-27
  Administered 2019-10-10 – 2019-10-27 (×39): 5000 [IU] via SUBCUTANEOUS

## 2019-10-09 MED ORDER — SODIUM CHLORIDE 0.9 % IV SOLP
2000 mL | INTRAVENOUS | 0 refills | Status: AC | PRN
Start: 2019-10-09 — End: ?

## 2019-10-09 MED ORDER — SODIUM CITRATE 4 % (3 ML) IK SYRG
0 refills | Status: CP
Start: 2019-10-09 — End: ?
  Administered 2019-10-09: 18:00:00 2.8 mL

## 2019-10-09 MED ORDER — SODIUM CHLORIDE 0.9 % IV SOLP
200 mL | INTRAVENOUS | 0 refills | Status: AC | PRN
Start: 2019-10-09 — End: ?

## 2019-10-09 MED ORDER — HYDROXYZINE HCL 25 MG PO TAB
25 mg | Freq: Once | ORAL | 0 refills | Status: CP
Start: 2019-10-09 — End: ?
  Administered 2019-10-09: 25 mg via ORAL

## 2019-10-09 MED ORDER — SODIUM CITRATE 4 % (3 ML) IK SYRG
1-6 mL | 0 refills | Status: AC | PRN
Start: 2019-10-09 — End: ?

## 2019-10-09 MED ORDER — POTASSIUM CHLORIDE 20 MEQ PO TBTQ
20 meq | Freq: Once | ORAL | 0 refills | Status: CP
Start: 2019-10-09 — End: ?
  Administered 2019-10-10: 04:00:00 20 meq via ORAL

## 2019-10-09 MED ORDER — IMS MIXTURE TEMPLATE
30 meq | Freq: Once | ORAL | 0 refills | Status: CP
Start: 2019-10-09 — End: ?
  Administered 2019-10-09 (×2): 30 meq via ORAL

## 2019-10-09 MED ADMIN — ONDANSETRON HCL (PF) 4 MG/2 ML IJ SOLN [136012]: 4 mg | INTRAVENOUS | @ 21:00:00 | Stop: 2019-10-09 | NDC 36000001225

## 2019-10-10 MED ORDER — SODIUM CHLORIDE 0.9 % IV SOLP
300 mL | INTRAVENOUS | 0 refills | Status: CP | PRN
Start: 2019-10-10 — End: ?
  Administered 2019-10-10: 18:00:00 300 mL via INTRAVENOUS

## 2019-10-10 MED ORDER — CLONIDINE HCL 0.1 MG PO TAB
.2 mg | Freq: Two times a day (BID) | ORAL | 0 refills | Status: DC
Start: 2019-10-10 — End: 2019-10-27
  Administered 2019-10-11 – 2019-10-27 (×30): 0.2 mg via ORAL

## 2019-10-10 MED ORDER — INSULIN GLARGINE 100 UNIT/ML (3 ML) SC INJ PEN
12 [IU] | Freq: Every evening | SUBCUTANEOUS | 0 refills | Status: DC
Start: 2019-10-10 — End: 2019-10-15

## 2019-10-10 MED ORDER — ROSUVASTATIN 10 MG PO TAB
10 mg | Freq: Every day | ORAL | 0 refills | Status: DC
Start: 2019-10-10 — End: 2019-10-27
  Administered 2019-10-11 – 2019-10-27 (×17): 10 mg via ORAL

## 2019-10-10 MED ORDER — POTASSIUM CHLORIDE 20 MEQ PO TBTQ
40 meq | Freq: Once | ORAL | 0 refills | Status: CP
Start: 2019-10-10 — End: ?
  Administered 2019-10-10: 15:00:00 40 meq via ORAL

## 2019-10-10 MED ORDER — SODIUM CHLORIDE 0.9 % IV SOLP
200 mL | INTRAVENOUS | 0 refills | Status: DC | PRN
Start: 2019-10-10 — End: 2019-10-10

## 2019-10-10 MED ORDER — CLONAZEPAM 1 MG PO TAB
1 mg | Freq: Every day | ORAL | 0 refills | Status: DC | PRN
Start: 2019-10-10 — End: 2019-10-27
  Administered 2019-10-10 – 2019-10-26 (×5): 1 mg via ORAL

## 2019-10-10 MED ORDER — PYRIDOXINE (VITAMIN B6) 50 MG PO TAB
25 mg | Freq: Every day | ORAL | 0 refills | Status: DC
Start: 2019-10-10 — End: 2019-10-27
  Administered 2019-10-10 – 2019-10-27 (×18): 25 mg via ORAL

## 2019-10-10 MED ORDER — SODIUM CHLORIDE 0.9 % IV SOLP
2000 mL | INTRAVENOUS | 0 refills | Status: DC | PRN
Start: 2019-10-10 — End: 2019-10-10

## 2019-10-10 MED ORDER — SODIUM CITRATE 4 % (3 ML) IK SYRG
1-6 mL | 0 refills | Status: CP | PRN
Start: 2019-10-10 — End: ?

## 2019-10-10 MED ADMIN — SODIUM CITRATE 4 % (3 ML) IK SYRG [301113]: 2.8 mL | @ 19:00:00 | Stop: 2019-10-10 | NDC 70092114843

## 2019-10-11 ENCOUNTER — Encounter: Admit: 2019-10-11 | Discharge: 2019-10-11 | Payer: Commercial Managed Care - Pharmacy Benefit Manager

## 2019-10-11 MED ORDER — FLUOXETINE 10 MG PO CAP
10 mg | Freq: Every day | ORAL | 0 refills | Status: DC
Start: 2019-10-11 — End: 2019-10-27
  Administered 2019-10-12 – 2019-10-27 (×16): 10 mg via ORAL

## 2019-10-11 MED ORDER — SODIUM CHLORIDE 0.9 % IV SOLP
2000 mL | INTRAVENOUS | 0 refills | Status: DC | PRN
Start: 2019-10-11 — End: 2019-10-12

## 2019-10-11 MED ORDER — SODIUM CITRATE 4 % (3 ML) IK SYRG
1-6 mL | 0 refills | Status: CP | PRN
Start: 2019-10-11 — End: ?

## 2019-10-11 MED ORDER — SODIUM CHLORIDE 0.9 % IV SOLP
300 mL | INTRAVENOUS | 0 refills | Status: CP | PRN
Start: 2019-10-11 — End: ?
  Administered 2019-10-11: 21:00:00 300 mL via INTRAVENOUS

## 2019-10-11 MED ORDER — SODIUM CHLORIDE 0.9 % IV SOLP
200 mL | INTRAVENOUS | 0 refills | Status: DC | PRN
Start: 2019-10-11 — End: 2019-10-12

## 2019-10-11 MED ADMIN — ONDANSETRON HCL (PF) 4 MG/2 ML IJ SOLN [136012]: 4 mg | INTRAVENOUS | @ 23:00:00 | Stop: 2019-10-11 | NDC 36000001225

## 2019-10-11 NOTE — Telephone Encounter
Tess called to discuss the patient's current status and that she is inpatient. She wanted to know next steps as well.

## 2019-10-12 MED ORDER — SODIUM PHOSPHATE IVPB
8 MMOL | Freq: Once | INTRAVENOUS | 0 refills | Status: CP
Start: 2019-10-12 — End: ?
  Administered 2019-10-12 (×2): 8 mmol via INTRAVENOUS

## 2019-10-12 MED ADMIN — SODIUM CITRATE 4 % (3 ML) IK SYRG [301113]: 2.8 mL | Stop: 2019-10-12 | NDC 70092114843

## 2019-10-13 MED ORDER — SODIUM CHLORIDE 0.9 % IV SOLP
200 mL | INTRAVENOUS | 0 refills | Status: AC | PRN
Start: 2019-10-13 — End: ?

## 2019-10-13 MED ORDER — SODIUM CITRATE 4 % (3 ML) IK SYRG
1-6 mL | 0 refills | Status: CP | PRN
Start: 2019-10-13 — End: ?

## 2019-10-13 MED ORDER — SODIUM CHLORIDE 0.9 % IV SOLP
300 mL | INTRAVENOUS | 0 refills | Status: CP | PRN
Start: 2019-10-13 — End: ?
  Administered 2019-10-13: 22:00:00 300 mL via INTRAVENOUS

## 2019-10-13 MED ORDER — SODIUM CHLORIDE 0.9 % IV SOLP
2000 mL | INTRAVENOUS | 0 refills | Status: AC | PRN
Start: 2019-10-13 — End: ?

## 2019-10-13 MED ADMIN — SODIUM CITRATE 4 % (3 ML) IK SYRG [301113]: 3.2 mL | @ 23:00:00 | Stop: 2019-10-13 | NDC 70092114843

## 2019-10-14 MED ORDER — POTASSIUM CHLORIDE 20 MEQ PO TBTQ
20 meq | Freq: Once | ORAL | 0 refills | Status: CP
Start: 2019-10-14 — End: ?
  Administered 2019-10-14: 18:00:00 20 meq via ORAL

## 2019-10-14 MED ORDER — INSULIN ASPART 100 UNIT/ML SC FLEXPEN
8 [IU] | Freq: Three times a day (TID) | SUBCUTANEOUS | 0 refills | Status: DC
Start: 2019-10-14 — End: 2019-10-15

## 2019-10-14 MED ORDER — INSULIN ASPART 100 UNIT/ML SC FLEXPEN
3 [IU] | SUBCUTANEOUS | 0 refills | Status: DC
Start: 2019-10-14 — End: 2019-10-15

## 2019-10-14 MED ORDER — POTASSIUM CHLORIDE 20 MEQ PO TBTQ
40 meq | Freq: Once | ORAL | 0 refills | Status: CP
Start: 2019-10-14 — End: ?
  Administered 2019-10-14: 23:00:00 40 meq via ORAL

## 2019-10-14 MED ORDER — FUROSEMIDE 10 MG/ML IJ SOLN
100 mg | Freq: Two times a day (BID) | INTRAVENOUS | 0 refills | Status: DC
Start: 2019-10-14 — End: 2019-10-18
  Administered 2019-10-14 – 2019-10-18 (×7): 100 mg via INTRAVENOUS

## 2019-10-14 MED ADMIN — ONDANSETRON HCL (PF) 4 MG/2 ML IJ SOLN [136012]: 4 mg | INTRAVENOUS | @ 01:00:00 | Stop: 2019-10-14 | NDC 72266012301

## 2019-10-15 ENCOUNTER — Inpatient Hospital Stay: Admit: 2019-10-15 | Discharge: 2019-10-15 | Payer: Commercial Managed Care - Pharmacy Benefit Manager

## 2019-10-15 ENCOUNTER — Encounter: Admit: 2019-10-15 | Discharge: 2019-10-15 | Payer: Commercial Managed Care - Pharmacy Benefit Manager

## 2019-10-15 MED ORDER — INSULIN ASPART 100 UNIT/ML SC FLEXPEN
4 [IU] | SUBCUTANEOUS | 0 refills | Status: DC
Start: 2019-10-15 — End: 2019-10-23

## 2019-10-15 MED ORDER — METOLAZONE 10 MG PO TAB
10 mg | Freq: Every day | ORAL | 0 refills | Status: DC
Start: 2019-10-15 — End: 2019-10-27
  Administered 2019-10-15 – 2019-10-27 (×13): 10 mg via ORAL

## 2019-10-15 MED ORDER — PIPERACILLIN/TAZOBACTAM 3.375 G/100ML NS IVPB (MB+)
3.375 g | INTRAVENOUS | 0 refills | Status: DC
Start: 2019-10-15 — End: 2019-10-16
  Administered 2019-10-15 – 2019-10-16 (×8): 3.375 g via INTRAVENOUS

## 2019-10-15 MED ORDER — INSULIN GLARGINE 100 UNIT/ML (3 ML) SC INJ PEN
15 [IU] | Freq: Every evening | SUBCUTANEOUS | 0 refills | Status: DC
Start: 2019-10-15 — End: 2019-10-16

## 2019-10-15 MED ORDER — INSULIN ASPART 100 UNIT/ML SC FLEXPEN
12 [IU] | Freq: Three times a day (TID) | SUBCUTANEOUS | 0 refills | Status: DC
Start: 2019-10-15 — End: 2019-10-21
  Administered 2019-10-19: 01:00:00 10 [IU] via SUBCUTANEOUS

## 2019-10-16 ENCOUNTER — Inpatient Hospital Stay: Admit: 2019-10-16 | Discharge: 2019-10-16 | Payer: Commercial Managed Care - Pharmacy Benefit Manager

## 2019-10-16 MED ORDER — SODIUM CHLORIDE 0.9 % IV SOLP
2000 mL | INTRAVENOUS | 0 refills | Status: DC | PRN
Start: 2019-10-16 — End: 2019-10-16

## 2019-10-16 MED ORDER — PIPERACILLIN/TAZOBACTAM 2.25 G/100ML NS IVPB (MB+)
2.25 g | INTRAVENOUS | 0 refills | Status: DC
Start: 2019-10-16 — End: 2019-10-17
  Administered 2019-10-17 (×4): 2.25 g via INTRAVENOUS

## 2019-10-16 MED ORDER — INSULIN GLARGINE 100 UNIT/ML (3 ML) SC INJ PEN
13 [IU] | Freq: Every evening | SUBCUTANEOUS | 0 refills | Status: DC
Start: 2019-10-16 — End: 2019-10-17

## 2019-10-16 MED ORDER — SODIUM CHLORIDE 0.9 % IV SOLP
200 mL | INTRAVENOUS | 0 refills | Status: DC | PRN
Start: 2019-10-16 — End: 2019-10-16

## 2019-10-16 MED ORDER — FENTANYL CITRATE (PF) 50 MCG/ML IJ SOLN
25 ug | Freq: Once | INTRAVENOUS | 0 refills | Status: AC
Start: 2019-10-16 — End: ?

## 2019-10-16 MED ORDER — SODIUM CITRATE 4 % (3 ML) IK SYRG
1-6 mL | 0 refills | Status: CP | PRN
Start: 2019-10-16 — End: ?

## 2019-10-16 MED ORDER — SODIUM CHLORIDE 0.9 % IV SOLP
300 mL | INTRAVENOUS | 0 refills | Status: CP | PRN
Start: 2019-10-16 — End: ?
  Administered 2019-10-16: 20:00:00 300 mL via INTRAVENOUS

## 2019-10-16 MED ADMIN — SODIUM CITRATE 4 % (3 ML) IK SYRG [301113]: 2.8 mL | @ 20:00:00 | Stop: 2019-10-16 | NDC 70092114843

## 2019-10-17 ENCOUNTER — Encounter: Admit: 2019-10-17 | Discharge: 2019-10-17 | Payer: Commercial Managed Care - Pharmacy Benefit Manager

## 2019-10-17 MED ORDER — AMOXICILLIN 500 MG PO CAP
500 mg | ORAL | 0 refills | Status: CP
Start: 2019-10-17 — End: ?
  Administered 2019-10-17 – 2019-10-22 (×6): 500 mg via ORAL

## 2019-10-17 MED ORDER — INSULIN GLARGINE 100 UNIT/ML (3 ML) SC INJ PEN
14 [IU] | Freq: Every evening | SUBCUTANEOUS | 0 refills | Status: DC
Start: 2019-10-17 — End: 2019-10-19

## 2019-10-17 MED ORDER — AMPICILLIN 2G/100ML NS IVPB (MB+)
2 g | INTRAVENOUS | 0 refills | Status: DC
Start: 2019-10-17 — End: 2019-10-17

## 2019-10-17 MED ORDER — FERROUS SULFATE 325 MG (65 MG IRON) PO TAB
325 mg | ORAL | 0 refills | Status: DC
Start: 2019-10-17 — End: 2019-10-27
  Administered 2019-10-17 – 2019-10-25 (×5): 325 mg via ORAL

## 2019-10-18 ENCOUNTER — Inpatient Hospital Stay: Admit: 2019-10-18 | Discharge: 2019-10-18 | Payer: Commercial Managed Care - Pharmacy Benefit Manager

## 2019-10-18 ENCOUNTER — Encounter: Admit: 2019-10-18 | Discharge: 2019-10-18 | Payer: Commercial Managed Care - Pharmacy Benefit Manager

## 2019-10-18 DIAGNOSIS — I10 Essential (primary) hypertension: Secondary | ICD-10-CM

## 2019-10-18 DIAGNOSIS — R413 Other amnesia: Secondary | ICD-10-CM

## 2019-10-18 DIAGNOSIS — I639 Cerebral infarction, unspecified: Secondary | ICD-10-CM

## 2019-10-18 DIAGNOSIS — E119 Type 2 diabetes mellitus without complications: Secondary | ICD-10-CM

## 2019-10-18 DIAGNOSIS — N289 Disorder of kidney and ureter, unspecified: Secondary | ICD-10-CM

## 2019-10-18 MED ORDER — BUMETANIDE 1 MG PO TAB
4 mg | Freq: Two times a day (BID) | ORAL | 0 refills | Status: DC
Start: 2019-10-18 — End: 2019-10-27
  Administered 2019-10-19 – 2019-10-27 (×17): 4 mg via ORAL

## 2019-10-18 MED ORDER — EPOETIN ALFA-EPBX 10,000 UNIT/ML IJ SOLN
10000 [IU] | SUBCUTANEOUS | 0 refills | Status: DC
Start: 2019-10-18 — End: 2019-10-27
  Administered 2019-10-26: 03:00:00 10000 [IU] via SUBCUTANEOUS

## 2019-10-18 MED ORDER — MIDAZOLAM 1 MG/ML IJ SOLN
INTRAVENOUS | 0 refills | Status: DC
Start: 2019-10-18 — End: 2019-10-18
  Administered 2019-10-18: 13:00:00 2 mg via INTRAVENOUS

## 2019-10-18 MED ORDER — SODIUM CHLORIDE 0.9 % IV SOLP
0 refills | Status: DC
Start: 2019-10-18 — End: 2019-10-18
  Administered 2019-10-18: 13:00:00 via INTRAVENOUS

## 2019-10-18 MED ORDER — SENNOSIDES-DOCUSATE SODIUM 8.6-50 MG PO TAB
2 | Freq: Two times a day (BID) | ORAL | 0 refills | Status: DC
Start: 2019-10-18 — End: 2019-10-27
  Administered 2019-10-18 – 2019-10-27 (×9): 2 via ORAL

## 2019-10-18 MED ORDER — SODIUM CITRATE 4 % (3 ML) IK SYRG
0 refills | Status: CP
Start: 2019-10-18 — End: ?
  Administered 2019-10-18: 14:00:00 3.2 mL

## 2019-10-18 MED ORDER — PROPOFOL INJ 10 MG/ML IV VIAL
0 refills | Status: DC
Start: 2019-10-18 — End: 2019-10-18
  Administered 2019-10-18: 13:00:00 20 mg via INTRAVENOUS
  Administered 2019-10-18: 14:00:00 30 mg via INTRAVENOUS
  Administered 2019-10-18: 14:00:00 20 mg via INTRAVENOUS
  Administered 2019-10-18: 13:00:00 30 mg via INTRAVENOUS

## 2019-10-18 MED ORDER — POTASSIUM CHLORIDE 20 MEQ PO TBTQ
20 meq | Freq: Once | ORAL | 0 refills | Status: CP
Start: 2019-10-18 — End: ?
  Administered 2019-10-18: 19:00:00 20 meq via ORAL

## 2019-10-18 MED ORDER — PROPOFOL 10 MG/ML IV EMUL 50 ML (INFUSION)(AM)(OR)
INTRAVENOUS | 0 refills | Status: DC
Start: 2019-10-18 — End: 2019-10-18
  Administered 2019-10-18: 13:00:00 100 ug/kg/min via INTRAVENOUS

## 2019-10-19 MED ORDER — ACETAMINOPHEN 500 MG PO TAB
1000 mg | ORAL | 0 refills | Status: DC | PRN
Start: 2019-10-19 — End: 2019-10-27
  Administered 2019-10-21 – 2019-10-25 (×5): 1000 mg via ORAL

## 2019-10-19 MED ORDER — ACETAMINOPHEN 500 MG PO TAB
500 mg | Freq: Once | ORAL | 0 refills | Status: CP
Start: 2019-10-19 — End: ?
  Administered 2019-10-20: 04:00:00 500 mg via ORAL

## 2019-10-19 MED ORDER — INSULIN ASPART 100 UNIT/ML SC FLEXPEN
0-6 [IU] | Freq: Before meals | SUBCUTANEOUS | 0 refills | Status: DC
Start: 2019-10-19 — End: 2019-10-24
  Administered 2019-10-22: 13:00:00 1 [IU] via SUBCUTANEOUS

## 2019-10-19 MED ORDER — GABAPENTIN 100 MG PO CAP
200 mg | Freq: Once | ORAL | 0 refills | Status: CP
Start: 2019-10-19 — End: ?
  Administered 2019-10-20: 04:00:00 200 mg via ORAL

## 2019-10-19 MED ORDER — INSULIN GLARGINE 100 UNIT/ML (3 ML) SC INJ PEN
13 [IU] | Freq: Every evening | SUBCUTANEOUS | 0 refills | Status: DC
Start: 2019-10-19 — End: 2019-10-24
  Administered 2019-10-24: 03:00:00 13 [IU] via SUBCUTANEOUS

## 2019-10-20 ENCOUNTER — Encounter: Admit: 2019-10-20 | Discharge: 2019-10-20 | Payer: Commercial Managed Care - Pharmacy Benefit Manager

## 2019-10-20 MED ORDER — SODIUM CHLORIDE 0.9 % IV SOLP
300 mL | INTRAVENOUS | 0 refills | Status: CP | PRN
Start: 2019-10-20 — End: ?
  Administered 2019-10-20: 22:00:00 300 mL via INTRAVENOUS

## 2019-10-20 MED ORDER — SODIUM CHLORIDE 0.9 % IV SOLP
200 mL | INTRAVENOUS | 0 refills | Status: DC | PRN
Start: 2019-10-20 — End: 2019-10-21

## 2019-10-20 MED ORDER — SODIUM CITRATE 4 % (3 ML) IK SYRG
1-6 mL | 0 refills | Status: CP | PRN
Start: 2019-10-20 — End: ?

## 2019-10-20 MED ORDER — SODIUM CHLORIDE 0.9 % IV SOLP
2000 mL | INTRAVENOUS | 0 refills | Status: DC | PRN
Start: 2019-10-20 — End: 2019-10-21

## 2019-10-20 MED ADMIN — SODIUM CITRATE 4 % (3 ML) IK SYRG [301113]: 3 mL | @ 22:00:00 | Stop: 2019-10-20 | NDC 70092114843

## 2019-10-21 MED ORDER — INSULIN ASPART 100 UNIT/ML SC FLEXPEN
10 [IU] | Freq: Three times a day (TID) | SUBCUTANEOUS | 0 refills | Status: DC
Start: 2019-10-21 — End: 2019-10-22

## 2019-10-22 MED ORDER — INSULIN ASPART 100 UNIT/ML SC FLEXPEN
10 [IU] | Freq: Three times a day (TID) | SUBCUTANEOUS | 0 refills | Status: DC
Start: 2019-10-22 — End: 2019-10-23

## 2019-10-22 MED ORDER — INSULIN ASPART 100 UNIT/ML SC FLEXPEN
12 [IU] | Freq: Three times a day (TID) | SUBCUTANEOUS | 0 refills | Status: DC
Start: 2019-10-22 — End: 2019-10-22

## 2019-10-23 MED ORDER — ALTEPLASE 2 MG IK SOLR
4 mg | Freq: Once | 0 refills | Status: CP
Start: 2019-10-23 — End: ?

## 2019-10-23 MED ORDER — SODIUM CHLORIDE 0.9 % IV SOLP
200 mL | INTRAVENOUS | 0 refills | Status: AC | PRN
Start: 2019-10-23 — End: ?

## 2019-10-23 MED ORDER — INSULIN ASPART 100 UNIT/ML SC FLEXPEN
1-25 [IU] | SUBCUTANEOUS | 0 refills | Status: DC
Start: 2019-10-23 — End: 2019-10-24

## 2019-10-23 MED ORDER — MAGNESIUM OXIDE 400 MG (241.3 MG MAGNESIUM) PO TAB
400 mg | Freq: Two times a day (BID) | ORAL | 0 refills | Status: CP
Start: 2019-10-23 — End: ?
  Administered 2019-10-23 – 2019-10-24 (×2): 400 mg via ORAL

## 2019-10-23 MED ORDER — INSULIN ASPART 100 UNIT/ML SC FLEXPEN
1-25 [IU] | Freq: Three times a day (TID) | SUBCUTANEOUS | 0 refills | Status: DC
Start: 2019-10-23 — End: 2019-10-24

## 2019-10-23 MED ORDER — SODIUM CITRATE 4 % (3 ML) IK SYRG
1-6 mL | 0 refills | Status: AC | PRN
Start: 2019-10-23 — End: ?

## 2019-10-23 MED ORDER — SODIUM CHLORIDE 0.9 % IV SOLP
300 mL | INTRAVENOUS | 0 refills | Status: CP | PRN
Start: 2019-10-23 — End: ?
  Administered 2019-10-23: 15:00:00 300 mL via INTRAVENOUS

## 2019-10-23 MED ORDER — HEPARIN (PORCINE) 1,000 UNIT/ML IJ SOLN
1000 [IU] | Freq: Once | INTRAVENOUS | 0 refills | Status: CP
Start: 2019-10-23 — End: ?

## 2019-10-23 MED ORDER — SODIUM CHLORIDE 0.9 % IV SOLP
2000 mL | INTRAVENOUS | 0 refills | Status: AC | PRN
Start: 2019-10-23 — End: ?

## 2019-10-23 MED ADMIN — ACETAMINOPHEN 500 MG PO TAB [102]: 1000 mg | ORAL | @ 16:00:00 | Stop: 2019-10-23 | NDC 00904673061

## 2019-10-23 MED ADMIN — HEPARIN (PORCINE) 1,000 UNIT/ML IJ SOLN [10176]: 1000 [IU] | INTRAVENOUS | @ 15:00:00 | Stop: 2019-10-23 | NDC 63739093114

## 2019-10-23 MED ADMIN — ALTEPLASE 2 MG IK SOLR [82551]: 4 mg | @ 16:00:00 | Stop: 2019-10-23 | NDC 50242004164

## 2019-10-23 MED ADMIN — ONDANSETRON HCL (PF) 4 MG/2 ML IJ SOLN [136012]: 4 mg | INTRAVENOUS | @ 16:00:00 | Stop: 2019-10-23 | NDC 72266012301

## 2019-10-24 MED ORDER — INSULIN ASPART 100 UNIT/ML SC FLEXPEN
0-12 [IU] | Freq: Before meals | SUBCUTANEOUS | 0 refills | Status: DC
Start: 2019-10-24 — End: 2019-10-24

## 2019-10-24 MED ORDER — INSULIN PUMP - ASPART
Freq: Three times a day (TID) | SUBCUTANEOUS | 0 refills | Status: DC | PRN
Start: 2019-10-24 — End: 2019-10-27
  Administered 2019-10-24: 21:00:00 3.000 mL via SUBCUTANEOUS

## 2019-10-25 MED ORDER — SODIUM CITRATE 4 % (3 ML) IK SYRG
1-6 mL | 0 refills | Status: CP | PRN
Start: 2019-10-25 — End: ?

## 2019-10-25 MED ORDER — CLONIDINE HCL 0.2 MG PO TAB
.2 mg | ORAL_TABLET | Freq: Two times a day (BID) | ORAL | 1 refills | Status: AC
Start: 2019-10-25 — End: ?

## 2019-10-25 MED ORDER — METOLAZONE 10 MG PO TAB
10 mg | ORAL_TABLET | Freq: Every day | ORAL | 1 refills | 84.00000 days | Status: DC
Start: 2019-10-25 — End: 2019-10-27

## 2019-10-25 MED ORDER — BUMETANIDE 2 MG PO TAB
4 mg | ORAL_TABLET | Freq: Two times a day (BID) | ORAL | 1 refills | Status: DC
Start: 2019-10-25 — End: 2019-10-27

## 2019-10-25 MED ORDER — SENNOSIDES-DOCUSATE SODIUM 8.6-50 MG PO TAB
2 | ORAL_TABLET | Freq: Two times a day (BID) | ORAL | 0 refills | Status: DC
Start: 2019-10-25 — End: 2019-10-27

## 2019-10-25 MED ORDER — FERROUS SULFATE 325 MG (65 MG IRON) PO TAB
325 mg | ORAL_TABLET | ORAL | 0 refills | Status: AC
Start: 2019-10-25 — End: ?

## 2019-10-25 MED ORDER — ONDANSETRON HCL 4 MG PO TAB
4 mg | ORAL_TABLET | ORAL | 0 refills | 8.00000 days | Status: DC | PRN
Start: 2019-10-25 — End: 2019-10-27

## 2019-10-25 MED ORDER — SODIUM CHLORIDE 0.9 % IV SOLP
2000 mL | INTRAVENOUS | 0 refills | Status: AC | PRN
Start: 2019-10-25 — End: ?

## 2019-10-25 MED ORDER — SODIUM CHLORIDE 0.9 % IV SOLP
300 mL | INTRAVENOUS | 0 refills | Status: CP | PRN
Start: 2019-10-25 — End: ?
  Administered 2019-10-25: 17:00:00 300 mL via INTRAVENOUS

## 2019-10-25 MED ORDER — SODIUM CHLORIDE 0.9 % IV SOLP
200 mL | INTRAVENOUS | 0 refills | Status: AC | PRN
Start: 2019-10-25 — End: ?

## 2019-10-25 MED ORDER — ERGOCALCIFEROL (VITAMIN D2) 1,250 MCG (50,000 UNIT) PO CAP
1 | ORAL_CAPSULE | ORAL | 0 refills | 56.00000 days | Status: AC
Start: 2019-10-25 — End: ?

## 2019-10-25 MED ORDER — PYRIDOXINE (VITAMIN B6) 25 MG PO TAB
25 mg | ORAL_TABLET | Freq: Every day | ORAL | 0 refills | Status: DC
Start: 2019-10-25 — End: 2019-10-27

## 2019-10-25 MED ADMIN — SODIUM CITRATE 4 % (3 ML) IK SYRG [301113]: 3.2 mL | @ 17:00:00 | Stop: 2019-10-25 | NDC 70092114843

## 2019-10-26 MED ORDER — FENTANYL CITRATE (PF) 50 MCG/ML IJ SOLN
0 refills | Status: DC
Start: 2019-10-26 — End: 2019-10-26
  Administered 2019-10-26: 19:00:00 50 ug via INTRAVENOUS

## 2019-10-26 MED ORDER — METOLAZONE 10 MG PO TAB
10 mg | ORAL_TABLET | Freq: Every day | ORAL | 1 refills | Status: CN
Start: 2019-10-26 — End: ?

## 2019-10-26 MED ORDER — FENTANYL CITRATE (PF) 50 MCG/ML IJ SOLN
50 ug | INTRAVENOUS | 0 refills | Status: CN | PRN
Start: 2019-10-26 — End: ?

## 2019-10-26 MED ORDER — SODIUM CITRATE 4 % (3 ML) IK SYRG
0 refills | Status: CP
Start: 2019-10-26 — End: ?
  Administered 2019-10-26: 19:00:00 3.2 mL

## 2019-10-26 MED ORDER — SODIUM CHLORIDE 0.9 % IV SOLP
200 mL | INTRAVENOUS | 0 refills | Status: DC | PRN
Start: 2019-10-26 — End: 2019-10-26

## 2019-10-26 MED ORDER — SODIUM CHLORIDE 0.9 % IV SOLP
2000 mL | INTRAVENOUS | 0 refills | Status: DC | PRN
Start: 2019-10-26 — End: 2019-10-26

## 2019-10-26 MED ORDER — SODIUM CHLORIDE 0.9 % IV SOLP
300 mL | INTRAVENOUS | 0 refills | Status: CP | PRN
Start: 2019-10-26 — End: ?
  Administered 2019-10-26: 21:00:00 300 mL via INTRAVENOUS

## 2019-10-26 MED ORDER — PROPOFOL INJ 10 MG/ML IV VIAL
0 refills | Status: DC
Start: 2019-10-26 — End: 2019-10-26
  Administered 2019-10-26: 19:00:00 70 mg via INTRAVENOUS
  Administered 2019-10-26: 19:00:00 30 mg via INTRAVENOUS

## 2019-10-26 MED ORDER — LIDOCAINE (PF) 200 MG/10 ML (2 %) IJ SYRG
0 refills | Status: DC
Start: 2019-10-26 — End: 2019-10-26
  Administered 2019-10-26: 19:00:00 60 mg via INTRAVENOUS

## 2019-10-26 MED ORDER — ONDANSETRON HCL (PF) 4 MG/2 ML IJ SOLN
4 mg | Freq: Once | INTRAVENOUS | 0 refills | Status: CN | PRN
Start: 2019-10-26 — End: ?

## 2019-10-26 MED ORDER — PROPOFOL 10 MG/ML IV EMUL 20 ML (INFUSION)(AM)(OR)
INTRAVENOUS | 0 refills | Status: DC
Start: 2019-10-26 — End: 2019-10-26
  Administered 2019-10-26: 19:00:00 140 ug/kg/min via INTRAVENOUS

## 2019-10-26 MED ORDER — HYDROMORPHONE (PF) 2 MG/ML IJ SYRG
.25 mg | INTRAVENOUS | 0 refills | Status: CN | PRN
Start: 2019-10-26 — End: ?

## 2019-10-26 MED ORDER — SODIUM CHLORIDE 0.9 % IV SOLP
0 refills | Status: DC
Start: 2019-10-26 — End: 2019-10-26
  Administered 2019-10-26 (×2): via INTRAVENOUS

## 2019-10-26 MED ORDER — MIDAZOLAM 1 MG/ML IJ SOLN
INTRAVENOUS | 0 refills | Status: DC
Start: 2019-10-26 — End: 2019-10-26
  Administered 2019-10-26: 19:00:00 2 mg via INTRAVENOUS

## 2019-10-26 MED ORDER — SODIUM CITRATE 4 % (3 ML) IK SYRG
1-6 mL | 0 refills | Status: CP | PRN
Start: 2019-10-26 — End: ?

## 2019-10-26 MED ORDER — CETIRIZINE 10 MG PO TAB
5 mg | ORAL_TABLET | ORAL | 0 refills | Status: AC
Start: 2019-10-26 — End: ?

## 2019-10-26 MED ORDER — PROMETHAZINE 25 MG/ML IJ SOLN
6.25 mg | INTRAVENOUS | 0 refills | Status: CN | PRN
Start: 2019-10-26 — End: ?

## 2019-10-26 MED ADMIN — SODIUM CITRATE 4 % (3 ML) IK SYRG [301113]: 3.2 mL | @ 22:00:00 | Stop: 2019-10-26 | NDC 70092114843

## 2019-10-27 MED ORDER — INSULIN SYRINGE-NEEDLE U-100 1 ML 31 GAUGE X 5/16 MISC SYRG
1 refills | 30.00000 days | Status: AC
Start: 2019-10-27 — End: ?

## 2019-10-27 MED ORDER — ONDANSETRON HCL 4 MG PO TAB
4 mg | ORAL_TABLET | ORAL | 0 refills | 8.00000 days | Status: AC | PRN
Start: 2019-10-27 — End: ?

## 2019-10-27 MED ORDER — INSULIN ASPART U-100 100 UNIT/ML SC SOLN
3 refills | 42.00000 days | Status: DC
Start: 2019-10-27 — End: 2019-10-27

## 2019-10-27 MED ORDER — LANTUS SOLOSTAR U-100 INSULIN 100 UNIT/ML (3 ML) SC INPN
13 [IU] | Freq: Every day | SUBCUTANEOUS | 0 refills | 60.00000 days | Status: AC
Start: 2019-10-27 — End: ?

## 2019-10-27 MED ORDER — INSULIN ASPART 100 UNIT/ML SC FLEXPEN
1 refills | 30.00000 days | Status: AC
Start: 2019-10-27 — End: ?

## 2019-10-27 MED ORDER — SENNOSIDES-DOCUSATE SODIUM 8.6-50 MG PO TAB
2 | ORAL_TABLET | Freq: Two times a day (BID) | ORAL | 0 refills | Status: AC
Start: 2019-10-27 — End: ?

## 2019-10-27 MED ORDER — INSULIN LISPRO 100 UNIT/ML SC SOLN
Freq: Every day | 3 refills | 30.00000 days | Status: AC
Start: 2019-10-27 — End: ?

## 2019-10-27 MED ORDER — PEN NEEDLE, DIABETIC 32 GAUGE X 5/32" MISC NDLE
1 | 0 refills | 30.00000 days | Status: DC | PRN
Start: 2019-10-27 — End: 2019-10-27

## 2019-10-27 MED ORDER — LANTUS SOLOSTAR U-100 INSULIN 100 UNIT/ML (3 ML) SC INPN
13 [IU] | Freq: Every day | SUBCUTANEOUS | 0 refills | 60.00000 days | Status: DC
Start: 2019-10-27 — End: 2019-10-27

## 2019-10-27 MED ORDER — INSULIN ASPART 100 UNIT/ML SC FLEXPEN
1 refills | 42.00000 days | Status: DC
Start: 2019-10-27 — End: 2019-10-27

## 2019-10-27 MED ORDER — MISCELLANEOUS MEDICAL SUPPLY MISC MISC
Freq: Four times a day (QID) | 3 refills | 1.00000 days | Status: AC
Start: 2019-10-27 — End: ?

## 2019-10-27 MED ORDER — PEN NEEDLE, DIABETIC 32 GAUGE X 5/32" MISC NDLE
1 | 0 refills | 30.00000 days | Status: AC | PRN
Start: 2019-10-27 — End: ?

## 2019-10-27 MED ORDER — INSULIN LISPRO 100 UNIT/ML SC SOLN
Freq: Every day | 3 refills | 30.00000 days | Status: DC
Start: 2019-10-27 — End: 2019-10-27

## 2019-10-27 MED ORDER — EPOETIN ALFA-EPBX 10,000 UNIT/ML IJ SOLN
10000 [IU] | SUBCUTANEOUS | 0 refills | 15.00000 days | Status: AC
Start: 2019-10-27 — End: ?

## 2019-10-27 MED ORDER — PYRIDOXINE (VITAMIN B6) 25 MG PO TAB
25 mg | ORAL_TABLET | Freq: Every day | ORAL | 0 refills | Status: AC
Start: 2019-10-27 — End: ?

## 2019-11-08 ENCOUNTER — Encounter: Admit: 2019-11-08 | Discharge: 2019-11-08 | Payer: Commercial Managed Care - Pharmacy Benefit Manager

## 2019-11-08 NOTE — Telephone Encounter
Received VM from pt's support person Romeo Apple updating that pt needs to resched test she had needed for her transplant eval that she could not complete because she was hospitalized- now back home and ready to resched.  Contacted Tess @ number left in VM [phone] and gave her [facility] number to resched testing [phone].   Tess v/u all info and agreed to call to sched appt.

## 2019-11-13 ENCOUNTER — Encounter: Admit: 2019-11-13 | Discharge: 2019-11-13 | Payer: Commercial Managed Care - Pharmacy Benefit Manager

## 2019-11-13 NOTE — Telephone Encounter
11/13/19 called and left message for pt to call back with records information. Need previous cardiac care info and verify pcp info edh

## 2019-12-06 ENCOUNTER — Encounter: Admit: 2019-12-06 | Discharge: 2019-12-06 | Payer: Commercial Managed Care - Pharmacy Benefit Manager

## 2019-12-07 ENCOUNTER — Encounter: Admit: 2019-12-07 | Discharge: 2019-12-07 | Payer: Commercial Managed Care - Pharmacy Benefit Manager

## 2019-12-07 ENCOUNTER — Ambulatory Visit: Admit: 2019-12-07 | Discharge: 2019-12-08 | Payer: MEDICAID

## 2019-12-07 DIAGNOSIS — I10 Essential (primary) hypertension: Secondary | ICD-10-CM

## 2019-12-07 DIAGNOSIS — I639 Cerebral infarction, unspecified: Secondary | ICD-10-CM

## 2019-12-07 DIAGNOSIS — R339 Retention of urine, unspecified: Secondary | ICD-10-CM

## 2019-12-07 DIAGNOSIS — R413 Other amnesia: Secondary | ICD-10-CM

## 2019-12-07 DIAGNOSIS — E119 Type 2 diabetes mellitus without complications: Secondary | ICD-10-CM

## 2019-12-07 DIAGNOSIS — N289 Disorder of kidney and ureter, unspecified: Secondary | ICD-10-CM

## 2019-12-07 NOTE — Assessment & Plan Note
She has a history of urinary retention with hospitalization but reports that she has been urinating well since discharge aside from one episode of urinary retention requiring straight catheterization. Her post void residual today by bladder scan is 8 cc, indicating that she is emptying her bladder well currently. It is okay for her to continue voiding spontaneously at this time and she can straight catheterize intermittently if needed. Since she has a history of urinary retention, she should have formal urodynamics testing as well as cystoscopy prior to clearance for renal transplant.    - okay to continue voiding spontaneously  - follow up with urodynamics testing and cystoscopy

## 2019-12-07 NOTE — Patient Instructions
About Urodynamics:  Urodynamics is a set of tests that measure bladder function (including storage and emptying) allowing physicians to understand and treat patients with bladder problems such as urinary leakage (incontinence) or bladder emptying (voiding).  The examination is performed by nurses who have been specially trained in urodynamic testing.    During a Urodynamics Study  The study may be done in your doctor?s office, a clinic, or a hospital. The study may take up to an hour or more. This depends on which tests your doctor performs. The tests are generally painless. You won?t need sedating medication.  Tests That May Be Done  Uroflowmetry. This measures the amount and speed of urine you void from your bladder. You urinate into a funnel. It?s attached to a computer that records your urine flow over time. The amount of urine left in your bladder after you void may also be measured right after this test.  Cystometry. This test evaluates how much your bladder can hold. It also measures how strong your bladder muscle is and how well the signals work that tell you when your bladder is full. Your health care provider fills your bladder with sterile water or saline solution, through a catheter. Your doctor will instruct you to report any sensations you feel. Mention if they?re similar to symptoms you?ve felt at home. Your doctor may ask you to cough, stand and walk, or bear down during this test.  Electromyogram. This helps evaluate the muscle contractions that control urination, such as sphincter muscle contractions. Your health care provider may place electrode patches or wires near your rectum or urethra to make the recording. He or she may ask you to try to tighten or relax your sphincter muscles during this test.  Pressure flow study. This test measures your detrusor, urethral, and abdominal pressures. Detrusor is the muscle surrounding the bladder walls that relaxes to allow your bladder to fill, and and contracts to squeeze out urine. A Pressure Flow Study is often performed after cystometry. You?re asked to urinate while a probe in your urethra measures pressures.    At the beginning of the test, you will be asked to urinate into a machine that measures your urine flow rate, so should arrive with a moderately full bladder.  We will test your urine for signs of infection. If an infection is present the test will need to be rescheduled. Please, contact our office before your appointment if you are having urinary tract infection symptoms.  For the procedure, a small catheter is placed into your bladder and one into the rectum for pressure measurements.  The catheters are taped and connected to tubing that allows sterile water to be slowly infused into your bladder.  After filling is complete, you will be asked to empty your bladder completely.    Instructions for the day of your urodynamics:  1. Report to the session with a moderately full bladder.  Try not to urinate at least one hour before arriving to the clinic.  If you have been diagnosed with Interstitial Cystitis, Neurogenic Bladder, Urinary Retention, or if you are unable to urinate, please empty your bladder before reporting for your procedure.  2. Wear comfortable clothing.  3. Eat and drink normally for the exam.  Do not fast.  4. Expect a minimum of one hour for the examination.  5. Discontinue all bladder relaxant medication three (3) days prior to examination, unless specifically advised to continue all medications.  Medications to be avoided are: Bentyl, Ditropan, Detrol,  Enablex, Gelnique, Hyoscamine, Levbid, Levsin, Lesinex, Oxybutynin, Oxytrol, Oxytrol Patch, Pyridium, Pyridium Plus, Sanctura, Toviaz, Urecholine, [place], [place], and [address].  If you are on medications for prostate enlargement such as Cardura, Doxazosin, Flomax, Tamsulosin, Hytrin, Terazosin, Rapaflo, Silodosin, please contact the office for further instructions.  If you have questions regarding your specific medication, contact your doctor?s office.    Getting Your Results  After the study, you?ll get dressed and return to the consultation room. Test results may be ready soon after the study is finished. Or, you may return to your doctor?s office in a few days for your results. Your doctor can talk with you about the study report and your options.  If you have a known urinary tract infection, please reschedule your appointment until treatment is completed.  Contact your doctor?s office for further instructions.  Thank you for choosing the [facility] Department of Urology for your health care needs.  If you must cancel your Urodynamic Study appointment, please provide 72 hours [three (3) business days] notice which allows [place] to provide this important service to another patient.          Cystoscopy    Cystoscopy is a procedure that lets your doctor look directly inside your urethra and bladder. It can be used to:  ? Help diagnose a problem with your urethra, bladder, or kidneys.  ? Take a sample (biopsy) of bladder or urethral tissue.  ? Treat certain problems (such as removing kidney stones).  ? Place a stent to bypass an obstruction.  ? Take special X-rays of the kidneys.  Based on the findings, your doctor may recommend other tests or treatments.  What is a cystoscope?  A cystoscope is a telescope-like instrument that contains lenses and fiberoptics (small glass wires that make bright light). The cystoscope may be straight and rigid, or flexible to bend around curves in the urethra. The doctor may look directly into the cystoscope, or project the image onto a monitor.  Getting ready  ? Ask your doctor if you should stop taking any medicines before the procedure.  ? Ask whether you should avoid eating or drinking anything after midnight before the procedure.  ? Follow any other instructions your doctor gives you.  Tell your?doctor before the exam if you:  ? Take any medicines, such as aspirin or blood thinners  ? Have allergies to any medicines  ? Are pregnant   The procedure  Cystoscopy is done in the doctor?s office, surgery center, or hospital. The doctor and a nurse are present during the procedure. It takes only a few minutes, longer if a biopsy, X-ray, or treatment needs to be done.  During the procedure:  ? You lie on an exam table on your back, knees bent and legs apart. You are covered with a drape.  ? Your urethra and the area around it are washed. Anesthetic jelly may be applied to numb the urethra. Other pain medicine is usually not needed. In some cases, you may be offered a mild sedative to help you relax. If a more extensive procedure is to be done, such as a biopsy or kidney stone removal, general anesthesia may be needed.  ? The cystoscope is inserted. A sterile fluid is put into the bladder to expand it. You may feel pressure from this fluid.  ? When the procedure is done, the cystoscope is removed.  After the procedure  If you had a sedative, general anesthesia, or spinal anesthesia, you must have someone  drive you home. Once you?re home:  ? Drink plenty of fluids.  ? You may have burning or light bleeding when you urinate?this is normal.  ? Medicines may be prescribed to ease any discomfort or prevent infection. Take these as directed.  ? Call your doctor if you have heavy bleeding or blood clots, burning that lasts more than a day, a fever over?100?F? (38? C), or trouble urinating.  Date Last Reviewed: 07/07/2015  ? 2000-2017 [employer], [place]. [address], [place], [place] [address]. All rights reserved. This information is not intended as a substitute for professional medical care. Always follow your healthcare professional's instructions.

## 2019-12-08 DIAGNOSIS — E1010 Type 1 diabetes mellitus with ketoacidosis without coma: Secondary | ICD-10-CM

## 2019-12-08 DIAGNOSIS — N319 Neuromuscular dysfunction of bladder, unspecified: Secondary | ICD-10-CM

## 2019-12-10 ENCOUNTER — Encounter: Admit: 2019-12-10 | Discharge: 2019-12-10 | Payer: Commercial Managed Care - Pharmacy Benefit Manager

## 2019-12-10 DIAGNOSIS — R413 Other amnesia: Secondary | ICD-10-CM

## 2019-12-10 DIAGNOSIS — I10 Essential (primary) hypertension: Secondary | ICD-10-CM

## 2019-12-10 DIAGNOSIS — I639 Cerebral infarction, unspecified: Secondary | ICD-10-CM

## 2019-12-10 DIAGNOSIS — N289 Disorder of kidney and ureter, unspecified: Secondary | ICD-10-CM

## 2019-12-10 DIAGNOSIS — E119 Type 2 diabetes mellitus without complications: Secondary | ICD-10-CM

## 2019-12-15 ENCOUNTER — Encounter: Admit: 2019-12-15 | Discharge: 2019-12-15 | Payer: Commercial Managed Care - Pharmacy Benefit Manager

## 2019-12-21 ENCOUNTER — Encounter: Admit: 2019-12-21 | Discharge: 2019-12-21 | Payer: Commercial Managed Care - Pharmacy Benefit Manager

## 2019-12-21 ENCOUNTER — Ambulatory Visit: Admit: 2019-12-21 | Discharge: 2019-12-21 | Payer: MEDICAID

## 2019-12-21 DIAGNOSIS — I10 Essential (primary) hypertension: Secondary | ICD-10-CM

## 2019-12-21 DIAGNOSIS — Z01818 Encounter for other preprocedural examination: Secondary | ICD-10-CM

## 2019-12-21 DIAGNOSIS — E1042 Type 1 diabetes mellitus with diabetic polyneuropathy: Secondary | ICD-10-CM

## 2019-12-21 DIAGNOSIS — Z20822 Encounter for screening laboratory testing for COVID-19 virus in asymptomatic patient: Secondary | ICD-10-CM

## 2019-12-21 DIAGNOSIS — I639 Cerebral infarction, unspecified: Secondary | ICD-10-CM

## 2019-12-21 DIAGNOSIS — R413 Other amnesia: Secondary | ICD-10-CM

## 2019-12-21 DIAGNOSIS — E119 Type 2 diabetes mellitus without complications: Secondary | ICD-10-CM

## 2019-12-21 DIAGNOSIS — E1010 Type 1 diabetes mellitus with ketoacidosis without coma: Secondary | ICD-10-CM

## 2019-12-21 DIAGNOSIS — N186 End stage renal disease: Secondary | ICD-10-CM

## 2019-12-21 DIAGNOSIS — N289 Disorder of kidney and ureter, unspecified: Secondary | ICD-10-CM

## 2019-12-21 DIAGNOSIS — Z941 Heart transplant status: Secondary | ICD-10-CM

## 2019-12-21 DIAGNOSIS — I615 Nontraumatic intracerebral hemorrhage, intraventricular: Secondary | ICD-10-CM

## 2019-12-21 LAB — COMPREHENSIVE METABOLIC PANEL
Lab: 0.3 mg/dL (ref 0.3–1.2)
Lab: 138 MMOL/L (ref 137–147)
Lab: 22 U/L (ref 7–56)
Lab: 24 U/L (ref 7–40)
Lab: 25 mL/min — ABNORMAL LOW (ref >60)
Lab: 28 MMOL/L (ref 21–30)
Lab: 30 mL/min — ABNORMAL LOW (ref >60)
Lab: 4.3 g/dL (ref 3.5–5.0)
Lab: 4.4 MMOL/L (ref 3.5–5.1)
Lab: 8 10*3/uL (ref 3–12)
Lab: 86 U/L (ref 25–110)

## 2019-12-21 LAB — LIPID PROFILE
Lab: 109 mg/dL — ABNORMAL HIGH (ref <150)
Lab: 114 mg/dL (ref 6.0–8.0)
Lab: 192 mg/dL (ref <200)
Lab: 22 mg/dL (ref 8.5–10.6)
Lab: 78 mg/dL (ref >40)
Lab: 84 mg/dL — ABNORMAL HIGH (ref <100)

## 2019-12-21 LAB — CBC AND DIFF
Lab: 0 10*3/uL (ref 0–0.20)
Lab: 4.1 M/UL (ref 4.0–5.0)
Lab: 6.6 10*3/uL (ref 4.5–11.0)

## 2019-12-21 MED ORDER — ASPIRIN 81 MG PO CHEW
324 mg | Freq: Once | ORAL | 0 refills | Status: CN
Start: 2019-12-21 — End: ?

## 2019-12-21 MED ORDER — ACETAMINOPHEN 325 MG PO TAB
650 mg | ORAL | 0 refills | Status: CN | PRN
Start: 2019-12-21 — End: ?

## 2019-12-21 MED ORDER — TEMAZEPAM 15 MG PO CAP
15 mg | Freq: Every evening | ORAL | 0 refills | Status: CN | PRN
Start: 2019-12-21 — End: ?

## 2019-12-21 MED ORDER — MAGNESIUM HYDROXIDE 2,400 MG/10 ML PO SUSP
10 mL | ORAL | 0 refills | Status: CN | PRN
Start: 2019-12-21 — End: ?

## 2019-12-21 MED ORDER — ALUMINUM-MAGNESIUM HYDROXIDE 200-200 MG/5 ML PO SUSP
30 mL | ORAL | 0 refills | Status: CN | PRN
Start: 2019-12-21 — End: ?

## 2019-12-21 MED ORDER — NITROGLYCERIN 0.4 MG SL SUBL
.4 mg | SUBLINGUAL | 0 refills | Status: CN | PRN
Start: 2019-12-21 — End: ?

## 2019-12-21 NOTE — Patient Instructions
CARDIAC CATHETERIZATION   PRE-ADMISSION INSTRUCTIONS    Patient Name: Brandy Joseph  MRN#: [id number]  Date of Birth: 10/03/1980 (39 y.o.)  Today's Date: 12/21/2019    PROCEDURE:  You are scheduled for a Coronary Angiogram with possible Angioplasty/Stenting.    PROCEDURE DATE AND ARRIVAL TIME:    Your procedure date is 01/04/20.  You will receive a call from the Cath lab staff between 8:00 a.m. and noon on the business day prior to your procedure to let you know at what time to arrive on the day of your procedure.    Please check in at the Admitting Desk in the [facility] for your procedure. [facility] and and take a right. Continue down the hallway past [facility] office. That hall will take [facility] into [facility]. Check in at the desk on the left side.)     (If you have further questions regarding your arrival time for the CV lab, please call [phone] by 3:00pm the day before your procedure. Please leave a message with your name and number, your call will be returned in a timely manner.)    PRE-PROCEDURE APPOINTMENTS:  12/21/19 Office visit to update history and physical (requirement within 30 days of procedure)  with Dr. Vivianne Spence at [facility]   12/21/19 Pre-Admission lab work required within 14 days of procedure:           SPECIAL MEDICATION INSTRUCTIONS  Nothing to eat after midnight before your procedure. No caffeine for 24 hours prior to your procedure. You will be under moderate sedation for your procedure.  You may drink clear liquids up to an hour before hospital arrival. This will be confirmed by the Cath lab staff the day before your procedure.          HOLD ALL over the counter vitamins or supplements on the morning of your procedure.  PLEASE TAKE THE MORNING OF YOUR PROCEDURE  Please either take 4 baby aspirins (4 times 81mg ) or one full strength NON-COATED 325mg  aspirin.   COREG, CLONIDINE, PROTONIX       Additional Instructions  If you wear CPAP, please bring your mask and machine with you to the hospital.    Take a bath or shower with anti-bacterial soap the evening before, or the morning of the procedure.     Bring photo ID and your health insurance card(s).    Arrange for a driver to take you home from the hospital. Please arrange for a friend or family member to take you home from this test. You cannot take a Taxi, [employer] [place], or public transportation as there has to be a responsible person to help care for you after sedation    Bring an accurate list of your current medications with you to the hospital (all medications and supplements taken daily).  Please use the medication list below and write in the date and time when you took your last dose before your procedure. Update this list of medications as needed.      Wear comfortable clothes and don't bring valuables, other than photo identification card, with you to the hospital.    Please pack a bag for an overnight stay.     Please review your pre-procedure instructions and bring them with you on the day of your procedure.  Call the office at [phone] with any questions. You may ask to speak with Dr. Ansel Bong nurse.        ALLERGIES  Allergies  Allergen Reactions   ? Cephalexin RASH   ? Ketorolac RASH   ? Sulfa (Sulfonamide Antibiotics) EDEMA     Full body swelling; eye swelling; itchiness       CURRENT MEDICATIONS   [place], [place]   Home Medication Instructions HAR:    Printed on:12/21/19 1427   Medication Information                      acetaminophen (TYLENOL) 325 mg tablet  Take two tablets by mouth every 4 hours as needed.             buPROPion XL (WELLBUTRIN XL) 150 mg tablet  Take 150 mg by mouth every morning. Do not crush or chew.             carvedilol (COREG) 25 mg tablet  Take one tablet by mouth twice daily. Take with food.             cetirizine (ZYRTEC) 10 mg tablet  Take one-half tablet by mouth three times weekly. Take after HD on HD days cloNIDine (CATAPRESS) 0.2 mg tablet  Take one tablet by mouth twice daily.             docusate (COLACE) 100 mg capsule  Take 100 mg by mouth twice daily as needed.             epoetin alfa-epbx (RETACRIT) 10,000 unit/mL injection  Inject 1 mL under the skin three times weekly. 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             ferrous sulfate (FEOSOL) 325 mg (65 mg iron) tablet  Take one tablet by mouth three times weekly. Take on an empty stomach at least 1 hour before or 2 hours after food.             FLUoxetine (PROZAC) 10 mg capsule  Take 10 mg by mouth daily.             glucagon (BAQSIMI) 3 mg/actuation spry  Apply  into nose as directed as Needed.             insulin aspart U-100 (NOVOLOG FLEXPEN) 100 unit/mL (3 mL) injection PEN  USE AN INSULIN TO CARB RATIO OF 1 UNIT FOR EVERY 6 GRAMS OF CARBS   Add additional Novolog to this scheduled dose based on the premeal blood sugar. This is the correction factor.               140-180 add 1units              181-220             add 2 units              221-260 add 3units              261-320             add 4 units              321-380             add 5 units              380 or higher add 6 units             insulin lispro(+) (HUMALOG U-100  INSULIN) 100 unit/mL injection  Maximum of 50 units daily. Via insulin pump. Use 1:6 insulin to carb ratio and the following correction factor Glucose 140-180 = 1 units Glucose 181-220 = 2 units Glucose 221-260 = 3 units Glucose 261-300 = 4 units Glucose 301-350 = 5 units Glucose 351-400 = 6 units Glucose >400 = 7 units             insulin pen needles (disposable) (BD UF NANO PEN NEEDLES) 32 gauge x 5/32 pen needle  Use one each as directed as Needed. Use with insulin injections.             Insulin Syringe-Needle U-100 (BD INSULIN SYRINGE ULTRA-FINE) 1 mL 31 gauge x 5/16 syrg  Use with insulin vials as directed             LANTUS SOLOSTAR U-100 INSULIN 100 unit/mL (3 mL) injection PEN  Inject thirteen Units under the skin daily. Use if insulin pump malfunctions             loperamide (IMODIUM A-D) 2 mg capsule  Take 2 mg by mouth as Needed for Diarrhea. Take 2 capsules by mouth initially, followed by 1 capsule by mouth after each loose stool up to a maximum of 8 tablets in 24 hours.             Miscellaneous Medical Supply misc  Straight catheter for use 4x daily             ondansetron (ZOFRAN) 4 mg tablet  Take one tablet by mouth every 6 hours as needed.             pantoprazole DR (PROTONIX) 40 mg tablet  Take one tablet by mouth daily.             pyridoxine (VITAMIN B-6) 25 mg tab  Take one tablet by mouth daily.             rosuvastatin (CRESTOR) 10 mg tablet  Take 10 mg by mouth daily.             senna/docusate (SENOKOT-S) 8.6/50 mg tablet  Take two tablets by mouth twice daily. Hold for loose stools             tamsulosin (FLOMAX) 0.4 mg capsule  Take one capsule by mouth daily after breakfast. Do not crush, chew or open capsules. Take 30 minutes following the same meal each day.               _________________________________________  Form completed by: Toribio Harbour, RN  Date completed: 12/21/19  Method: In person and given to the patient.     Coronary Angiography  Angiography is a special type of moving X-ray that lets your doctor view your coronary arteries to see if the blood vessels to your heart are narrowed or blocked. This test is done when someone is having a heart attack. Or it may be done if symptoms may mean a heart attack. It also may be done after an irregular stress test.   ?Before the procedure   ? Tell your healthcare team what medicines you take and any allergies you may have.  ? Tell your healthcare team if you've had a reaction to contrast dye or have had any kidney problems.  ? Don?t eat or drink anything for at least 6 to 8 hours before the procedure. You will likely be told not to have anything after midnight, the night before the procedure.  ? A nurse will  place an IV (intravenous) catheter in your vein to give fluids, and medicine to relieve pain and help you feel less anxious.  ? He or she will clean your skin and shave the area where the catheter will be inserted, if needed.    During the procedure   ? Your doctor will place a long, thin tube called a catheter inside an artery in your groin or arm and guide it into your heart. You may feel pressure with the insertion of the catheter.  ? He or she will inject a contrast dye through the catheter into your blood vessels or heart chambers. You may feel a warm sensation or feeling like you have to urinate when the contrast is injected. This is normal.  ? X-rays are taken to show images of the inside of your heart and coronary arteries.     The catheter can be placed into the groin, arm, or wrist.     After the procedure   ? Your healthcare team will tell you how long to lie down and keep the insertion site still.  ? If the insertion site was in your groin, you may need to lie down with your leg still for several hours. If the insertion site was in your wrist, a pressure bandage will be put on the site. It will be taken off when there is no sign of bleeding. If bleeding occurs, a nurse will put pressure on the area to control it.  ? A nurse will check your blood pressure and the insertion site often. This is to make sure you remain stable after the procedure.  ? You may be asked to drink fluid to help flush the contrast liquid out of your system.  ? Have someone drive you home from the hospital.  ? If your doctor uses angioplasty or stent to treat a blocked artery, you may stay the night in the hospital. If there are multiple blockages that can't be fixed with a stent or angioplasty, you may need surgery to bypass the blockages (bypass surgery). Your doctor will explain the results of your test and what treatment options that may be best for you.  ? It?s normal to find a small bruise or lump at the insertion site. The lump may be the collagen plug or stitch that you feel, or a small bruise. These common side effects should disappear within a few weeks.  ? You will be given instructions by your healthcare team on recovering from the coronary angiography. In general, don't lift anything heavier than a gallon of milk for several days. This gives time for the puncture site in the artery wall to heal. Try not to get the puncture site wet. Don't put it under water. Showers are OK. Don't soak in a bathtub, swimming pool, or hot tub until the skin has healed.    When to call your healthcare provider   Contact your healthcare provider right away if you have any of these:  ? Chest pain  ? Shortness of breath  ? Pain, swelling, redness, bleeding, or drainage at the insertion site  ? Severe pain, coldness, or a bluish color in the leg or arm that held the catheter  ? Fever over 100.4?F (38?C), or as advised by your provider  StayWell last reviewed this educational content on 12/04/2017  ? 2000-2021 [employer], [place]. All rights reserved. This information is not intended as a substitute for professional medical care. Always follow your healthcare professional's instructions.  Coronary Stents  A stent is a small metal coil or mesh tube that is placed in a narrowed artery to hold it open. This?helps improve blood flow to your heart. The stent also helps keep the artery from re-narrowing (restenosis). Some stents are coated and slowly release medicine over a time. This reduces the amount of scar tissue that forms in the artery, helping prevent restenosis. A heart specialist called an interventional cardiologist does coronary angioplasty and stenting. He or she has specialized training in using the equipment?and in doing the procedure as safely as possible.     ?During the procedure  ? A member of your healthcare team will numb the skin at the insertion site with a local anesthetic. This is usually the groin or the wrist) with a local anesthetic. He or she will make a needle puncture to insert the catheter.  ? Your doctor will insert a guide wire through the thin, flexible tube (catheter) and move it to the narrow spot in your heart's artery. Your doctor tracks its movement using pulsed X-ray called fluoroscopy. An angiogram will be done which is an X-ray movie of heart artery blood flow using contrast. This identifies the location of the stenosis.  ? Your doctor will then insert a balloon-tipped catheter through the guide catheter and thread it over the guide wire. He or she will position it at the narrow part of the artery.  ? Your doctor will deliver a stent mounted on a balloon-tipped catheter to the blockage in?your artery.  ? He or she will inflate the balloon to expand the stent.  ? The expanded stent further compresses the plaque against the artery wall, increasing the blood flow to the heart muscle.    After the procedure  ? Your doctor will give you medicine to prevent blood clots from forming on the new stent. You will continue to take this medicine until the stent and artery have healed. Your healthcare team will tell you how long you should take this. Your doctor will give you a prescription before you go home. This prescription is often for a medicine called clopidogrel. This medicine is usually taken with aspirin to prevent blood clots from forming inside of the newly opened area of the artery which would cause a heart attack.  ? Your healthcare team will tell you how long to lie down and keep the insertion site still. The amount of time may depend on whether a closure device such as a stitch or collagen plug was used to close the opening made in your artery. The time you must be still may be shorter if one of these devices was used. The amount of time will also depend on if there is any bleeding at the artery site.  ? If the insertion site was in your groin, you may need to lie down with your leg still for several hours. A pressure band is often used to hold pressure on the wrist. It's slowly removed once there is no sign of bleeding.  ? A nurse will check your blood pressure and the insertion site.  ? You may be asked to drink fluid to help flush the contrast liquid out of your system.  ? You will likely be given other prescriptions to prevent other areas of the arteries from narrowing. This includes medicine to control cholesterol, such as statins. You may also get a beta blocker to prevent a heart attack. Nitroglycerin is prescribed to treat episodes of chest pain (angina) if this  occurs. Don't mix nitroglycerin with medicines that are used to treat erectile dysfunction or pulmonary hypertension. This can cause a dangerous drop in your blood pressure.  ? You need to have a follow up appointments to check how well you respond to the stent and new medicines. This can be as early as a week or maybe within 2 to 4 weeks of your coronary stent.  ? You may stay for several hours overnight in the hospital. Have someone drive you home from the hospital.  ? It?s normal to find a small bruise or lump at the insertion site. This should disappear within a few weeks.  ? You will be given discharge instructions that tell you how to keep the puncture site clean and dry and what activity restrictions you may have after your procedure. Make sure you ask your healthcare team about any questions or concerns you have after your procedure.    When to call your healthcare provider  Call your healthcare provider right away if you have any of the following:  ? Angina (a feeling of pain, pressure, aching, tingling, or burning in the chest, back, neck, throat, jaw, arms, or shoulders)  ? Increasing pain, swelling, redness, bleeding, or drainage at the insertion site  ? Severe pain, coldness, or a bluish color in the leg or arm that held the catheter  ? Shortness of breath  ? Trouble urinating or blood in your urine  ? Fever of?100.4?F (38?C) or higher, or as directed by your healthcare provider  StayWell last reviewed this educational content on 01/03/2018  ? 2000-2021 [employer], [place]. All rights reserved. This information is not intended as a substitute for professional medical care. Always follow your healthcare professional's instructions.

## 2019-12-21 NOTE — Progress Notes
Date of Service: 12/21/2019    Brandy Joseph is a 39 y.o. female.       HPI    Brandy Joseph comes for cardiac evaluation.  She is a very pleasant 39 year old woman.  She has had insulin-dependent diabetes for 37 years.  She has developed renal failure.  She has had retinopathy and neuropathy.  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  She required brain surgery for drainage.  Her head CT scan showed some old lacunar infarcts.  She has been left with some right leg weakness and some memory changes.  She is being considered for combined kidney and pancreas transplant.  She recently started on dialysis.  She has a temporary right chest catheter.  She does not have a fistula access at this point.  She is being considered for peritoneal dialysis.  In March, she had an echocardiogram that showed normal heart function and a thallium study that showed no ischemia.  As part of her workup, she has been referred for heart catheterization to rule out diabetic diffuse multivessel disease.  She has some fatigue, tiredness, and exertional dyspnea.  There is no typical chest pain.  She denies palpitations, presyncope, syncope, and peripheral edema.  There has been no claudication.  She has not had COVID infection.  She has not had a COVID vaccination and is 'not sure' that she wants to get that.  She has had hypertension for about 10 years, hyperlipidemia, longstanding diabetes.  She does not smoke.    ([id number])               Vitals:    12/21/19 1330   BP: (!) 152/88   BP Source: Arm, Right Upper   Patient Position: Sitting   Pulse: 84   SpO2: 96%   Weight: 74.5 kg (164 lb 3.2 oz)   Height: 1.676 m (5' 6)   PainSc: Zero     Body mass index is 26.5 kg/m?Marland Kitchen     Past Medical History  Patient Active Problem List    Diagnosis Date Noted   ? Nontraumatic subcortical hemorrhage of left cerebral hemisphere [facility]) 03/17/2018     Priority: High   ? ESRD (end stage renal disease) on dialysis (HCC) 12/21/2019   ? Pre-transplant evaluation for kidney and pancreas transplant 12/21/2019   ? Retention of urine 12/07/2019   ? Incidental lung nodule 10/26/2019   ? Anemia of chronic disease 10/25/2019   ? Esophageal candidiasis (HCC) 10/25/2019   ? AKI (acute kidney injury) (HCC) 09/28/2019   ? Hyperkalemia 04/26/2018   ? Vitreous hemorrhage of left eye (HCC) 04/11/2018   ? Cognitive impairment 04/11/2018   ? Neurogenic bladder 04/06/2018   ? Impaired transfers 04/06/2018   ? Intraventricular hemorrhage (HCC) 04/06/2018   ? Delirium 03/24/2018   ? Hypertensive emergency 03/18/2018   ? Renal insufficiency 03/18/2018   ? Pain of left hand 10/17/2015   ? Entrapment of right ulnar nerve 04/09/2015   ? Carpal tunnel syndrome of right wrist 04/09/2015   ? 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 Abnormal MRI, thoracic spine 12/06/2014   ? Type 1 diabetes mellitus with ketoacidosis without coma (HCC) 10/12/2014   ? Essential hypertension 10/12/2014   ? Diabetic retinopathy associated with type 1 diabetes mellitus (HCC) 10/12/2014   ? Neuropathic pain of right lower extremity 10/12/2014   ? Gait abnormality 10/12/2014   ? Impaired mobility  and ADLs 10/12/2014   ? Sensory deficit, right 10/08/2014   ? Leg weakness 10/06/2014         Review of Systems   All other systems reviewed and are negative.  Review of systems is documented in the database.    ([id number])        Physical Exam  On examination, she is in no distress.  She is 5 feet 6 inches, weight is 164, BMI is 26.5.  Blood pressure 152/88.  Pulse is regular at 60 beats per minute.  Rhythm is sinus.  Saturation is 96%.  There is no cyanosis.  Venous pressure is normal.  There is no edema.  Lungs are clear.  There is no wheeze or rhonchi.  PMI is not felt.  Heart sounds are normal.  I do not hear any gallops, murmurs, or rubs.  There is no hepatomegaly.  There are no abdominal bruits.  Bowel sounds are normal.  There is no icterus.  There is right leg weakness.  Distal pulses are 1+ bilaterally.  There is no carotid bruit.  She has a right chest catheter.  She is alert and oriented times 3.  Her mood, judgment, and affect are normal.    ([id number])        Cardiovascular Studies  EKG shows sinus rhythm with some minimal ST elevation.  I cannot rule out septal wall infarction pattern.  There has been no change.    ([id number])        Problems Addressed Today  Encounter Diagnoses   Name Primary?   ? Pre-transplant evaluation for kidney and pancreas transplant Yes   ? Encounter for screening laboratory testing for COVID-19 virus in asymptomatic patient    ? Essential hypertension    ? Type 1 diabetes mellitus with ketoacidosis without coma (HCC)    ? Heart transplant status (HCC)    ? Pre-transplant evaluation for kidney transplant    ? 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 Intraventricular hemorrhage (HCC)    ? ESRD (end stage renal disease) on dialysis [facility])        Assessment and Plan    Brandy Joseph has longstanding diabetes, complicating problems, retinopathy, nephropathy and neuropathy.  She has had recent stroke and may have coronary disease.  Her cardiac imaging looked low risk.  She is going to undergo coronary angiography and possible intervention.  We talked about the procedure and the potential risks.  She is making some urine.  We have talked about how the contrast may affect that.  We have talked about moderate conscious sedation and possible intervention versus bypass surgery if there is severe disease.  I will keep you informed with the results of her studies as they become available.  She needs ongoing risk factor modification.  I would like to check her lipids and a lipoprotein(a) level.  I will keep you informed with the results.  Further recommendations are pending these results.    ([id number])               Current Medications (including today's revisions)  ? acetaminophen (TYLENOL) 325 mg tablet Take two tablets by mouth every 4 hours as needed.   ? buPROPion XL (WELLBUTRIN XL) 150 mg tablet Take 150 mg by mouth every morning. Do not crush or chew.   ? carvedilol (COREG) 25 mg tablet Take one tablet by mouth twice daily. Take with food.   ? cetirizine (ZYRTEC) 10 mg tablet Take one-half  tablet by mouth three times weekly. Take after HD on HD days   ? cloNIDine (CATAPRESS) 0.2 mg tablet Take one tablet by mouth twice daily.   ? docusate (COLACE) 100 mg capsule Take 100 mg by mouth twice daily as needed.   ? epoetin alfa-epbx (RETACRIT) 10,000 unit/mL injection Inject 1 mL under the skin three times weekly. Indications: anemia in hemodialysis-dependent chronic kidney disease   ? ergocalciferol (VITAMIN D-2) 1,250 mcg (50,000 unit) capsule Take one capsule by mouth every 7 days.   ? ferrous sulfate (FEOSOL) 325 mg (65 mg iron) tablet Take one tablet by mouth three times weekly. Take on an empty stomach at least 1 hour before or 2 hours after food.   ? FLUoxetine (PROZAC) 10 mg capsule Take 10 mg by mouth daily.   ? glucagon (BAQSIMI) 3 mg/actuation spry Apply  into nose as directed as Needed.   ? insulin aspart U-100 (NOVOLOG FLEXPEN) 100 unit/mL (3 mL) injection PEN USE AN INSULIN TO CARB RATIO OF 1 UNIT FOR EVERY 6 GRAMS OF CARBS   Add additional Novolog to this scheduled dose based on the premeal blood sugar. This is the correction factor.               140-180 add 1units              181-220             add 2 units              221-260 add 3units              261-320             add 4 units              321-380             add 5 units              380 or higher add 6 units   ? insulin lispro(+) (HUMALOG U-100 INSULIN) 100 unit/mL injection Maximum of 50 units daily. Via insulin pump. Use 1:6 insulin to carb ratio and the following correction factor Glucose 140-180 = 1 units Glucose 181-220 = 2 units Glucose 221-260 = 3 units Glucose 261-300 = 4 units Glucose 301-350 = 5 units Glucose 351-400 = 6 units Glucose >400 = 7 units   ? insulin pen needles (disposable) (BD UF NANO PEN NEEDLES) 32 gauge x 5/32 pen needle Use one each as directed as Needed. Use with insulin injections.   ? Insulin Syringe-Needle U-100 (BD INSULIN SYRINGE ULTRA-FINE) 1 mL 31 gauge x 5/16 syrg Use with insulin vials as directed   ? LANTUS SOLOSTAR U-100 INSULIN 100 unit/mL (3 mL) injection PEN Inject thirteen Units under the skin daily. Use if insulin pump malfunctions   ? loperamide (IMODIUM A-D) 2 mg capsule Take 2 mg by mouth as Needed for Diarrhea. Take 2 capsules by mouth initially, followed by 1 capsule by mouth after each loose stool up to a maximum of 8 tablets in 24 hours.   ? Miscellaneous Medical Supply misc Straight catheter for use 4x daily   ? ondansetron (ZOFRAN) 4 mg tablet Take one tablet by mouth every 6 hours as needed.   ? pantoprazole DR (PROTONIX) 40 mg tablet Take one tablet by mouth daily.   ? pyridoxine (VITAMIN B-6) 25 mg tab Take one tablet by mouth daily.   ? rosuvastatin (CRESTOR) 10 mg tablet Take 10 mg by  mouth daily.   ? senna/docusate (SENOKOT-S) 8.6/50 mg tablet Take two tablets by mouth twice daily. Hold for loose stools   ? tamsulosin (FLOMAX) 0.4 mg capsule Take one capsule by mouth daily after breakfast. Do not crush, chew or open capsules. Take 30 minutes following the same meal each day.

## 2019-12-21 NOTE — Patient Instructions
HAVE LABS DRAWN      Please send a MyChart message or call the Cardiology GOLD Team with questions, [phone], Raina Mina, Cicero Duck RN, Herbie Baltimore, RN, [facility] LPN.     You may receive test results in MyChart before the ordering provider has reviewed them. Our care team will follow up with you after reviewing the tests to discuss your care. This may take up to 5-7 business days if results are not urgently needing to be addressed. Thank you for your patience.

## 2019-12-27 ENCOUNTER — Encounter: Admit: 2019-12-27 | Discharge: 2019-12-27 | Payer: Commercial Managed Care - Pharmacy Benefit Manager

## 2019-12-28 ENCOUNTER — Encounter: Admit: 2019-12-28 | Discharge: 2019-12-28 | Payer: Commercial Managed Care - Pharmacy Benefit Manager

## 2019-12-28 NOTE — Progress Notes
6-[facility] Medicaid No pre-certification is required for CATH 23557, PCI [phone] per Elgie Collard. REF# [id number]    confirmed benefits and eligibility:  Current and active since 12-05-19, $ 0 deductible with required   co-insurance of 0% to max OOP $ 0, then plan will pay 100% of allowable charges.

## 2020-01-02 ENCOUNTER — Encounter: Admit: 2020-01-02 | Discharge: 2020-01-02 | Payer: Commercial Managed Care - Pharmacy Benefit Manager

## 2020-01-02 ENCOUNTER — Ambulatory Visit: Admit: 2020-01-02 | Discharge: 2020-01-02 | Payer: MEDICAID

## 2020-01-02 DIAGNOSIS — R4189 Other symptoms and signs involving cognitive functions and awareness: Secondary | ICD-10-CM

## 2020-01-03 ENCOUNTER — Encounter: Admit: 2020-01-03 | Discharge: 2020-01-03 | Payer: Commercial Managed Care - Pharmacy Benefit Manager

## 2020-01-03 NOTE — Telephone Encounter
Noreene Larsson called to notify us that pt did not get her COVID test.  Pt is supposed to have heart cath tomorrow.  Noreene Larsson told her she can try and get a rapid test done today.    I called and spoke to pt.  I have spoke with her numerous times since the OV.  She was aware and given the number to [facility] for scheduling.  With each phone call, she verbalized understanding and said she would get it scheduled.  Today she told me she has memory problems and does not remember talking to me.  This was the first time she has mentioned this and did not request for [place] to talk to anyone else on her behalf for scheduling issues.  She said that she is going to try and schedule a rapid test today at dialysis.  I told her if she does not get it scheduled we will cancel her heart cath.  She verbalized understanding.

## 2020-01-03 NOTE — Telephone Encounter
Tried to call Brandy Joseph and it went straight to VM.  I called Arlys John her emergency contact.  He said that she has short term memory problems.  She will not tell anyone that and will make it seem like she understands instructions, but then she will not follow through with scheduling.  I asked him if he were okay if we called with future results and patient care issues.  He said he would prefer it, so he can help her keep track of all appointments and testing.

## 2020-01-03 NOTE — Telephone Encounter
-----   Message from Toribio Harbour, RN sent at 01/03/2020 12:57 PM CDT -----  Regarding: Did she get COVID scheduled today

## 2020-01-04 ENCOUNTER — Encounter: Admit: 2020-01-04 | Discharge: 2020-01-04 | Payer: Commercial Managed Care - Pharmacy Benefit Manager

## 2020-01-04 DIAGNOSIS — I10 Essential (primary) hypertension: Secondary | ICD-10-CM

## 2020-01-04 DIAGNOSIS — Z01818 Encounter for other preprocedural examination: Secondary | ICD-10-CM

## 2020-01-04 DIAGNOSIS — N289 Disorder of kidney and ureter, unspecified: Secondary | ICD-10-CM

## 2020-01-05 MED ORDER — ROSUVASTATIN 20 MG PO TAB
20 mg | ORAL_TABLET | Freq: Every day | ORAL | 3 refills | 90.00000 days | Status: AC
Start: 2020-01-05 — End: ?

## 2020-01-15 ENCOUNTER — Encounter: Admit: 2020-01-15 | Discharge: 2020-01-15 | Payer: Commercial Managed Care - Pharmacy Benefit Manager

## 2020-01-15 NOTE — Telephone Encounter
I received a call from [facility] lab in [facility] [place] of a critical glucose on [place]. I called Krystin and had to lm to see if she had her labs done fasting and before she took her insulin. I lm asking her to contact her pcp about this. I asked for a call back or [facility] message confirming the received this message. She is scheduled for a heart cath on Wednesday. Will discuss with JT NP since Dr Vivianne Spence is out of the office today.

## 2020-01-17 ENCOUNTER — Encounter: Admit: 2020-01-17 | Discharge: 2020-01-17 | Payer: Commercial Managed Care - Pharmacy Benefit Manager

## 2020-01-17 DIAGNOSIS — I10 Essential (primary) hypertension: Secondary | ICD-10-CM

## 2020-01-17 DIAGNOSIS — N289 Disorder of kidney and ureter, unspecified: Secondary | ICD-10-CM

## 2020-01-17 DIAGNOSIS — N186 End stage renal disease: Secondary | ICD-10-CM

## 2020-01-17 DIAGNOSIS — Z01818 Encounter for other preprocedural examination: Secondary | ICD-10-CM

## 2020-01-17 DIAGNOSIS — Z20822 Encounter for screening laboratory testing for COVID-19 virus in asymptomatic patient: Secondary | ICD-10-CM

## 2020-01-18 ENCOUNTER — Encounter: Admit: 2020-01-18 | Discharge: 2020-01-18 | Payer: Commercial Managed Care - Pharmacy Benefit Manager

## 2020-01-18 DIAGNOSIS — R413 Other amnesia: Secondary | ICD-10-CM

## 2020-01-18 DIAGNOSIS — N289 Disorder of kidney and ureter, unspecified: Secondary | ICD-10-CM

## 2020-01-18 DIAGNOSIS — I10 Essential (primary) hypertension: Secondary | ICD-10-CM

## 2020-01-18 DIAGNOSIS — E119 Type 2 diabetes mellitus without complications: Secondary | ICD-10-CM

## 2020-01-18 DIAGNOSIS — I639 Cerebral infarction, unspecified: Secondary | ICD-10-CM

## 2020-01-18 MED ORDER — TEMAZEPAM 15 MG PO CAP
15 mg | Freq: Every evening | ORAL | 0 refills | Status: DC | PRN
Start: 2020-01-18 — End: 2020-01-19

## 2020-01-18 MED ORDER — NITROGLYCERIN 0.4 MG SL SUBL
.4 mg | SUBLINGUAL | 0 refills | Status: DC | PRN
Start: 2020-01-18 — End: 2020-01-19

## 2020-01-18 MED ORDER — SODIUM CHLORIDE 0.9 % IV SOLP
INTRAVENOUS | 0 refills | Status: DC
Start: 2020-01-18 — End: 2020-01-19
  Administered 2020-01-18: 18:00:00 1000.000 mL via INTRAVENOUS

## 2020-01-18 MED ORDER — ALUMINUM-MAGNESIUM HYDROXIDE 200-200 MG/5 ML PO SUSP
30 mL | ORAL | 0 refills | Status: DC | PRN
Start: 2020-01-18 — End: 2020-01-19

## 2020-01-18 MED ORDER — MAGNESIUM HYDROXIDE 2,400 MG/10 ML PO SUSP
10 mL | ORAL | 0 refills | Status: DC | PRN
Start: 2020-01-18 — End: 2020-01-19

## 2020-01-18 MED ORDER — ASPIRIN 81 MG PO CHEW
324 mg | Freq: Once | ORAL | 0 refills | Status: DC
Start: 2020-01-18 — End: 2020-01-19

## 2020-01-18 MED ORDER — ACETAMINOPHEN 325 MG PO TAB
650 mg | ORAL | 0 refills | Status: DC | PRN
Start: 2020-01-18 — End: 2020-01-19

## 2020-01-23 ENCOUNTER — Encounter: Admit: 2020-01-23 | Discharge: 2020-01-23 | Payer: Commercial Managed Care - Pharmacy Benefit Manager

## 2020-01-25 ENCOUNTER — Encounter: Admit: 2020-01-25 | Discharge: 2020-01-25 | Payer: Commercial Managed Care - Pharmacy Benefit Manager

## 2020-01-25 NOTE — Progress Notes
39 yo female pt presented to [facility] with c/o soa, abdominal pain.  Pt has history of ESRD and is currently on hemodialysis.  CXR revealed a subtle pneumonia and the CT abd/pelvis showed small ureteral stone with no hydronephrosis.  Pt was covid negative. Pt was found to also have a UTI.  Pt was given Levaquin to cover the pneumonia and UTI.  Sending states that pt needs admission and is currently being evaluated at [facility] for transplant and has requested to come back to [place].  Pt alert and oriented x 3.  VSS  VS 162/67, HR 98, RR 16, 97% 2 L, Temp 100.9  Labs   Wbc 10.4  Hgb 11.6  Hct 34.8  Plt 320  Lactate 1.0  Na 138  K 3.8  Cl 104  CO2 23  A gap 15  BUN 11  Cr 2.12  Glu 163  Urine - +glu, +blood, + leuks, +bacteria

## 2020-02-20 ENCOUNTER — Encounter: Admit: 2020-02-20 | Discharge: 2020-02-20 | Payer: Commercial Managed Care - Pharmacy Benefit Manager

## 2020-02-20 ENCOUNTER — Ambulatory Visit: Admit: 2020-02-20 | Discharge: 2020-02-21 | Payer: MEDICAID

## 2020-02-20 DIAGNOSIS — I639 Cerebral infarction, unspecified: Secondary | ICD-10-CM

## 2020-02-20 DIAGNOSIS — E119 Type 2 diabetes mellitus without complications: Secondary | ICD-10-CM

## 2020-02-20 DIAGNOSIS — R413 Other amnesia: Secondary | ICD-10-CM

## 2020-02-20 DIAGNOSIS — I10 Essential (primary) hypertension: Secondary | ICD-10-CM

## 2020-02-20 DIAGNOSIS — Z20822 Encounter for screening laboratory testing for COVID-19 virus in asymptomatic patient: Secondary | ICD-10-CM

## 2020-02-20 DIAGNOSIS — N289 Disorder of kidney and ureter, unspecified: Secondary | ICD-10-CM

## 2020-02-20 DIAGNOSIS — K5180 Other ulcerative colitis without complications: Secondary | ICD-10-CM

## 2020-02-20 MED ORDER — PEG-ELECTROLYTE SOLN 420 GRAM PO SOLR
0 refills | Status: AC
Start: 2020-02-20 — End: ?

## 2020-02-21 ENCOUNTER — Ambulatory Visit: Admit: 2020-02-21 | Discharge: 2020-02-21 | Payer: Commercial Managed Care - Pharmacy Benefit Manager

## 2020-02-21 ENCOUNTER — Encounter: Admit: 2020-02-21 | Discharge: 2020-02-21 | Payer: Commercial Managed Care - Pharmacy Benefit Manager

## 2020-02-21 DIAGNOSIS — K5180 Other ulcerative colitis without complications: Secondary | ICD-10-CM

## 2020-03-07 ENCOUNTER — Encounter: Admit: 2020-03-07 | Discharge: 2020-03-07 | Payer: Commercial Managed Care - Pharmacy Benefit Manager

## 2020-03-13 ENCOUNTER — Encounter: Admit: 2020-03-13 | Discharge: 2020-03-13 | Payer: Commercial Managed Care - Pharmacy Benefit Manager

## 2020-03-13 ENCOUNTER — Ambulatory Visit: Admit: 2020-03-13 | Discharge: 2020-03-14 | Payer: Commercial Managed Care - Pharmacy Benefit Manager

## 2020-03-13 ENCOUNTER — Ambulatory Visit: Admit: 2020-03-13 | Discharge: 2020-03-13 | Payer: MEDICAID

## 2020-03-13 DIAGNOSIS — I639 Cerebral infarction, unspecified: Secondary | ICD-10-CM

## 2020-03-13 DIAGNOSIS — R339 Retention of urine, unspecified: Secondary | ICD-10-CM

## 2020-03-13 DIAGNOSIS — N289 Disorder of kidney and ureter, unspecified: Secondary | ICD-10-CM

## 2020-03-13 DIAGNOSIS — R413 Other amnesia: Secondary | ICD-10-CM

## 2020-03-13 DIAGNOSIS — N319 Neuromuscular dysfunction of bladder, unspecified: Secondary | ICD-10-CM

## 2020-03-13 DIAGNOSIS — I10 Essential (primary) hypertension: Secondary | ICD-10-CM

## 2020-03-13 DIAGNOSIS — E119 Type 2 diabetes mellitus without complications: Secondary | ICD-10-CM

## 2020-03-13 MED ORDER — NITROFURANTOIN MONOHYD/M-CRYST 100 MG PO CAP
100 mg | ORAL_CAPSULE | Freq: Two times a day (BID) | ORAL | 0 refills | 7.00000 days | Status: AC
Start: 2020-03-13 — End: ?

## 2020-03-13 NOTE — Progress Notes
Patient presented to clinic for Urodynamic study and cystoscopy.  Procedures explained and consent signed.  Patient tolerated well.

## 2020-03-19 ENCOUNTER — Encounter: Admit: 2020-03-19 | Discharge: 2020-03-19 | Payer: Commercial Managed Care - Pharmacy Benefit Manager

## 2020-03-19 ENCOUNTER — Encounter: Admit: 2020-02-20 | Discharge: 2020-02-20 | Payer: Commercial Managed Care - Pharmacy Benefit Manager

## 2020-04-03 ENCOUNTER — Ambulatory Visit: Admit: 2020-04-03 | Discharge: 2020-04-03 | Payer: Commercial Managed Care - Pharmacy Benefit Manager

## 2020-04-03 DIAGNOSIS — K5180 Other ulcerative colitis without complications: Secondary | ICD-10-CM

## 2020-04-14 ENCOUNTER — Encounter: Admit: 2020-04-14 | Discharge: 2020-04-14 | Payer: Commercial Managed Care - Pharmacy Benefit Manager

## 2020-04-14 DIAGNOSIS — N289 Disorder of kidney and ureter, unspecified: Secondary | ICD-10-CM

## 2020-04-14 DIAGNOSIS — R413 Other amnesia: Secondary | ICD-10-CM

## 2020-04-14 DIAGNOSIS — E119 Type 2 diabetes mellitus without complications: Secondary | ICD-10-CM

## 2020-04-14 DIAGNOSIS — I639 Cerebral infarction, unspecified: Secondary | ICD-10-CM

## 2020-04-14 DIAGNOSIS — I10 Essential (primary) hypertension: Secondary | ICD-10-CM

## 2020-04-19 ENCOUNTER — Encounter: Admit: 2020-04-19 | Discharge: 2020-04-19 | Payer: MEDICAID

## 2020-05-01 ENCOUNTER — Encounter: Admit: 2020-05-01 | Discharge: 2020-05-01 | Payer: MEDICAID

## 2020-05-01 NOTE — Telephone Encounter
Called Aunt Tess as instructed due to pt head injury. Discussed they would like Covid 19 pre-testing to be done at [facility] in [place]. Joe. Orders faxed with confirmation of receipt. E-mail sent to Aunt at [email or web address] with instructions for colonoscopy & bowel prep.

## 2020-05-15 ENCOUNTER — Encounter: Admit: 2020-05-15 | Discharge: 2020-05-15 | Payer: MEDICAID

## 2020-05-15 ENCOUNTER — Ambulatory Visit: Admit: 2020-05-15 | Discharge: 2020-05-15 | Payer: MEDICAID

## 2020-05-15 ENCOUNTER — Encounter: Admit: 2020-05-15 | Discharge: 2020-05-15 | Payer: Commercial Managed Care - Pharmacy Benefit Manager

## 2020-05-15 DIAGNOSIS — I639 Cerebral infarction, unspecified: Secondary | ICD-10-CM

## 2020-05-15 DIAGNOSIS — R413 Other amnesia: Secondary | ICD-10-CM

## 2020-05-15 DIAGNOSIS — Z20822 Encounter for screening laboratory testing for COVID-19 virus in asymptomatic patient: Secondary | ICD-10-CM

## 2020-05-15 DIAGNOSIS — N289 Disorder of kidney and ureter, unspecified: Secondary | ICD-10-CM

## 2020-05-15 DIAGNOSIS — I10 Essential (primary) hypertension: Secondary | ICD-10-CM

## 2020-05-15 DIAGNOSIS — E119 Type 2 diabetes mellitus without complications: Secondary | ICD-10-CM

## 2020-05-15 LAB — COVID-19 (SARS-COV-2) PCR

## 2020-05-15 MED ORDER — PROPOFOL INJ 10 MG/ML IV VIAL
INTRAVENOUS | 0 refills | Status: DC
Start: 2020-05-15 — End: 2020-05-15
  Administered 2020-05-15 (×7): 30 mg via INTRAVENOUS
  Administered 2020-05-15: 17:00:00 50 mg via INTRAVENOUS
  Administered 2020-05-15: 16:00:00 100 mg via INTRAVENOUS
  Administered 2020-05-15: 17:00:00 20 mg via INTRAVENOUS
  Administered 2020-05-15 (×2): 30 mg via INTRAVENOUS

## 2020-05-15 MED ORDER — LIDOCAINE (PF) 20 MG/ML (2 %) IJ SOLN
INTRAVENOUS | 0 refills | Status: DC
Start: 2020-05-15 — End: 2020-05-15
  Administered 2020-05-15: 16:00:00 100 mg via INTRAVENOUS

## 2020-05-15 NOTE — Anesthesia Post-Procedure Evaluation
Post-Anesthesia Evaluation    Name: Brandy Joseph      MRN: [id number]     DOB: 01-18-1981     Age: 39 y.o.     Sex: female   __________________________________________________________________________     Procedure Information     Anesthesia Start Date/Time: 05/15/20 1023    Procedures:       COLONOSCOPY WITH BIOPSY - FLEXIBLE (N/A )      COLONOSCOPY WITH SNARE REMOVAL TUMOR/ POLYP/ OTHER LESION    Location: ENDO 3 / ENDO/GI    Surgeons: Eligah East, MD          Post-Anesthesia Vitals  BP: 113/69 (11/10 1130)  Pulse: 69 (11/10 1130)  Respirations: 15 PER MINUTE (11/10 1130)  SpO2: 100 % (11/10 1130)  SpO2 Pulse: 68 (11/10 1130)   Vitals Value Taken Time   BP 113/69 05/15/20 1130   Temp 36.7 C (98.1 F) 05/15/20 1109   Pulse 69 05/15/20 1130   Respirations 15 PER MINUTE 05/15/20 1130   SpO2 100 % 05/15/20 1130   ABP     ART BP           Post Anesthesia Evaluation Note    Evaluation location: Pre/Post  Patient participation: recovered; patient participated in evaluation  Level of consciousness: alert  Pain management: adequate    Hydration: normovolemia  Temperature: 36.0C - 38.4C  Airway patency: adequate    Perioperative Events       Post-op nausea and vomiting: no PONV    Postoperative Status  Cardiovascular status: hemodynamically stable  Respiratory status: spontaneous ventilation  Additional comments: Complains of right shoulder pain        Perioperative Events  Perioperative Event: No

## 2020-05-16 ENCOUNTER — Encounter: Admit: 2020-05-16 | Discharge: 2020-05-16 | Payer: MEDICAID

## 2020-05-16 DIAGNOSIS — R413 Other amnesia: Secondary | ICD-10-CM

## 2020-05-16 DIAGNOSIS — E119 Type 2 diabetes mellitus without complications: Secondary | ICD-10-CM

## 2020-05-16 DIAGNOSIS — N289 Disorder of kidney and ureter, unspecified: Secondary | ICD-10-CM

## 2020-05-16 DIAGNOSIS — I639 Cerebral infarction, unspecified: Secondary | ICD-10-CM

## 2020-05-16 DIAGNOSIS — I10 Essential (primary) hypertension: Secondary | ICD-10-CM

## 2020-05-21 ENCOUNTER — Encounter: Admit: 2020-05-21 | Discharge: 2020-05-21 | Payer: MEDICAID

## 2020-05-23 ENCOUNTER — Encounter: Admit: 2020-05-23 | Discharge: 2020-05-23 | Payer: MEDICAID

## 2020-05-23 NOTE — Telephone Encounter
Called dialysis to see what days Brandy Joseph dialyzes. She dialyzes Monday and Friday. RN requested an MTN sample be drawn tomorrow to prepare for SPK listing. Also requested that her 2728 form be sent to me. [place] fax number.   Spoke with Brandy Joseph, SW at dialysis and updated her on the patient completing all of her testing except the mammogram so I wanted to keep them in the loop. She stated she will pass this onto Brandy Joseph,SW who covers Brandy Joseph's care to help give friendly reminders.

## 2020-07-10 ENCOUNTER — Encounter: Admit: 2020-07-10 | Discharge: 2020-07-10 | Payer: MEDICAID

## 2020-07-10 NOTE — Telephone Encounter
Called Brandy Joseph to follow up on if she was able to update her mammogram yet. She stated she was able to do it and had completed it at [facility].

## 2020-07-16 NOTE — Progress Notes
Patient to be discussed in transplant selection committee tomorrow and SW previously had identified potential adherence concerns related to her medical follow-up.  SW noted per EMR that patient is now on dialysis, since 03/2020. SW called and spoke with Sharyn Lull, dialysis RN, who denies any adherence concerns from the dialysis perspective.  She stated that patient has re-scheduled some treatments, but does attend those treatments as scheduled.    Lynetta Mare, LMSW

## 2020-07-17 ENCOUNTER — Encounter: Admit: 2020-07-17 | Discharge: 2020-07-17 | Payer: MEDICAID

## 2020-07-17 NOTE — Committee Review
Committee Review Note     Evaluation Date: 09/05/2019  Committee Review Date: 07/17/2020    Organ being evaluated for: Kidney    Transplant Phase: Evaluation  Transplant Status:  Deferred    Transplant Coordinator: Ralph Dowdy  Transplant Surgeon:       Referring Physician: Lorre Munroe    Primary Diagnosis:   Secondary Diagnosis:     Committee Review Members:  Anesthesiology Currie Paris, MD   Dietician, Registered Margarita Grizzle, RD   Financial Counseling and Assistance Services [facility] Orma Flaming, Lillie Columbia, Velna Hatchet [place]   Pharmacist Sherlyn Hay, [place]   Psychiatry Rutherford Limerick, DO   Social Worker Benjaman Lobe, LMSW   Social Worker, Clinical [facility]   Transplant Hepatology Dale Durham, RN, Melynda Keller, RN, Ollen Bowl, MD, Ardeen Fillers, RN, Rayfield Citizen, MD, Dawna Part, MD, Beverely Risen, RN, Dorothey Baseman, MD   Transplant Services Diane Dortha Kern, MD, Merlyn Albert, RN, Sandria Senter   Transplant Surgery York Cerise, MD       Transplant Eligibility: Adults age 29 or older.  A minor may be considered on an individual basis, End Stage Renal Disease as defined by CrCl or GFR of less than or equal to 20 mL/min; or currently on dialysis, Acceptable surgical/medical risks as determined by the transplant team, Emotional and socio-psychological stability and demonstrated adherence to the recommended medical regimen, Financial resources for transplant and post-transplant management    Committee Review Decision: Approved    Relative Contraindications:     Absolute Contraindications:     Committee Discussion Details:      She is a 40 yo with ESRD due to DM1. She was seen last in Sept 2021 for SPK evaluation.. Looked like a reasonable candidate. Completed echo, stress test, and cardiac cath. Cardiac testing was normal. Cancer screenings are up to date and normal. Neuro psych evaluation completed for stroke history. History of intermittent self cath. Urology, GI and endocrine notes obtained. No urologic barriers to transplant.  Surgery: Dr. Caprice Kluver. Good candidate. Has PVD. Social Work: She has strong support. She has a friend that helps with her pill box.  Pharmacy: Close follow up with pharmacist needed after transplant., Dietician: No concerns. TFA: [employer]. No FAP needed. Listing auth on file. cPRA: 0. Most recent sample: 09/05/19.    Action Items: Not applicable    Does patient have PVD?: Yes     Financial Plan Submitted?: Not applicable     Financial Plan Approved?: Not applicable    Is Financial Auth Needed?: Yes on file     Recommended for KDPI > 85%:  No    Recommended for [place] Kidneys: No    Recommended for 2:1 Kidney Offer: No    Does patient need to return to committee?: No Reason: Approved

## 2020-07-17 NOTE — Telephone Encounter
Brandy Joseph has been verified though [employer].  As of 07/17/2020 patient has ACTIVE   Primary [place]. Ref.#: [id number].  *No Financial Assessment Needed  *Listing Auth on file with no exp.  *No Financial Concerns/Barriers

## 2020-07-18 ENCOUNTER — Encounter: Admit: 2020-07-18 | Discharge: 2020-07-18 | Payer: MEDICAID

## 2020-07-26 ENCOUNTER — Encounter: Admit: 2020-07-26 | Discharge: 2020-07-26 | Payer: MEDICAID

## 2020-07-29 ENCOUNTER — Encounter: Admit: 2020-07-29 | Discharge: 2020-07-29 | Payer: MEDICAID

## 2020-07-29 DIAGNOSIS — N186 End stage renal disease: Secondary | ICD-10-CM

## 2020-07-29 DIAGNOSIS — Z01818 Encounter for other preprocedural examination: Secondary | ICD-10-CM

## 2020-08-01 ENCOUNTER — Encounter: Admit: 2020-08-01 | Discharge: 2020-08-01 | Payer: MEDICAID

## 2020-08-09 ENCOUNTER — Encounter: Admit: 2020-08-09 | Discharge: 2020-08-09 | Payer: MEDICAID

## 2020-08-13 ENCOUNTER — Encounter: Admit: 2020-08-13 | Discharge: 2020-08-13 | Payer: MEDICAID

## 2020-08-14 NOTE — Committee Review
PreTransplant Discussion Note     Organ being evaluated for: Kidney/Pancreas    Transplant Coordinator: Orvis Brill  Transplant Surgeon:       Transplant Physician:     Committee Review Members:  Financial Counseling and Assistance Services [facility] [place], Mayra Magana Arnell Asal, Gearldine Shown   Nephrology Diane Kem Kays, MD, Merlyn Lot, MD, Theadora Rama, MD, Fabio Neighbors, MD, Wonda Cheng, MD, Thea Gist, APRN-NP   Pharmacist Clemencia Course, [place]   Psychiatry Edsel Petrin, MD   Social Worker, Clinical [facility]   Transplant Services Burtis Junes, RN, Sueanne Margarita, RN, Livingston Diones, RN, Orvis Brill, RN, Verneda Skill, RN, Evorn Gong, RN, Thurston Pounds, RN   Transplant Surgery Leilani Able, MD, Almon Register, MD       Reason for Discussion:     Discussion Summary:    Decision/ Update Plan of Care:    Additional Discussion Notes and Findings:     Patient in evaluation for SPK transplant, CT scan from 07/2020 reviewed by surgeons. Surgeons report that vessel health is adequate for transplantation.

## 2020-09-04 ENCOUNTER — Encounter: Admit: 2020-09-04 | Discharge: 2020-09-04 | Payer: MEDICAID

## 2020-09-04 NOTE — Telephone Encounter
Received a voicemail from [facility] stating that Karena has discharged from the hospital last Wednesday. She wanted to keep me up to date in regards to getting the discharge summary and request any imaging that was done that we needed for the chest imaging follow up.    ** Patient needs MTN, Serum oxalate and Pulmonary referral completed. Looks like she needs to reschedule the GI telehealth as well.

## 2020-10-17 ENCOUNTER — Encounter: Admit: 2020-10-17 | Discharge: 2020-10-17 | Payer: MEDICAID

## 2020-10-17 NOTE — Telephone Encounter
Spoke with Tess about next steps. Patient needs MTN, Serum oxalate and Pulmonary referral completed. Looks like she needs to reschedule the GI telehealth as well. RN provided numbers to call. She will call in the morning to get these re-scheduled.    Tess will let me know when the Pulmonary appointment is so I can schedule her for the labs.

## 2020-10-22 ENCOUNTER — Encounter: Admit: 2020-10-22 | Discharge: 2020-10-22 | Payer: MEDICAID

## 2020-10-22 NOTE — Telephone Encounter
Received a call from Tess in regards to trying to schedule the pulmonology appointment but they stated they needed her to complete a CT chest first. The CT chest order is expiring. I informed Tess that Roby has already completed the CT chest so they do not need to do it. I will ensure the imaging is in there for review. I also spoke to her about the recent review of the discharge summary. 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 She stated that Abishai has seen someone recently because she had to do testing to ensure the infection was gone. She will find out who the provider was.     I called with Tess to schedule the pulmonary appointment.    I called [facility] and spoke with Radiology. They confirmed a January 2022 CT chest. She will cloud it over to [place]. She will fax me the impression as well.     Cloud Imaging Request Sent to [facility]  Patient: Scout Wickersham   MRN: [id number]   DOB: [birth year]   Location: [facility]  Study: All available images within previous 12 months.  Thank-you!

## 2020-10-23 ENCOUNTER — Ambulatory Visit: Admit: 2020-10-23 | Discharge: 2020-10-24 | Payer: MEDICAID

## 2020-10-23 ENCOUNTER — Encounter: Admit: 2020-10-23 | Discharge: 2020-10-23 | Payer: MEDICARE

## 2020-10-23 DIAGNOSIS — Z01818 Encounter for other preprocedural examination: Principal | ICD-10-CM

## 2020-10-23 DIAGNOSIS — R413 Other amnesia: Secondary | ICD-10-CM

## 2020-10-23 DIAGNOSIS — N289 Disorder of kidney and ureter, unspecified: Secondary | ICD-10-CM

## 2020-10-23 DIAGNOSIS — 1 ERRONEOUS ENCOUNTER--DISREGARD: Secondary | ICD-10-CM

## 2020-10-23 DIAGNOSIS — E119 Type 2 diabetes mellitus without complications: Secondary | ICD-10-CM

## 2020-10-23 DIAGNOSIS — I10 Essential (primary) hypertension: Secondary | ICD-10-CM

## 2020-10-23 DIAGNOSIS — K3184 Gastroparesis: Secondary | ICD-10-CM

## 2020-10-23 DIAGNOSIS — R918 Other nonspecific abnormal finding of lung field: Secondary | ICD-10-CM

## 2020-10-23 DIAGNOSIS — I639 Cerebral infarction, unspecified: Secondary | ICD-10-CM

## 2020-10-23 NOTE — Telephone Encounter
Completes and CT chest w/out at [facility]    Nodules  Recurrent pna; pre-transplant eval    Next available  This encounter was created in error. Please disregard.

## 2020-10-23 NOTE — Telephone Encounter
Called Brandy Joseph and unfortunately did not catch her in time to get the labs. She was already headed home. She is going to come back in a week for PFTs and a CT chest. She will do the labs then.    She requested an e-mail to remind her to do labs the same day since she doesn't need an appointment for it.    Hello Brandy Joseph,    Here are you upcoming appointments. Don?t forget to go the the 1st floor in the Medical Office Building and do the labs we need. There is a front desk at the main entrance that can always direct you.    [id number] Testing (pulmonary function testing)  Wednesday April 27 7:00 AM    Parking: [address] (Parking Garage P2)   Entrance: Directly across from parking garage P2, the Medical and [employer] is on the NW corner of [address] and [address].   Specialty:   Location: [address]   Internal Directions: Inside the main entrance, take a slight left to enter the double glass doors. Go down the ramp and through the next set of glass doors. Take a left just past the elevators, then the first right, and the clinic is on the left. Pulmonary Function Lab: [facility]  [address]   Level 1, [address]   [place] [address]  [phone]   [phone] CT CHEST W/O CONTRAST  Wednesday April 27 5:30 PM (Arrive by 5:15 PM)    Parking:   Parking is available in Parking Garage 2 (P2) at the corner of [address] and [address]. P2 parking is $3 with validation. Validate your ticket at any information desk, or ask your provider during your appointment.    Entrance:   Directly across from P2, the Medical [facility] is on the [employer] of [address] and [address]. Enter the building through Entrance A on the ground level. Or take the pedestrian skybridge connecting P2 to the Medical Pavilion.    Wayfinding directions:   Upon entering the building on the ground level, a directory and elevators are located directly to the right in the lobby. Directories are also located on every level. Follow wayfinding signage to level and suite. If entering via the skybridge from Parking Garage 2 (P2), continue straight through the double glass doors and then veer to the right on the walkway. Suite will be on the left. Check in at the registration desk. Imaging, CT: [facility], Medical Pavilion  [address]   Level 2, [address]   [place] [address]  [phone]     Thank you,    Ralph Dowdy, BSN, RN, CCTC  Lead Kidney & Pancreas Clinical Nurse Coordinator   [facility]  Phone [phone] - Fax [phone] - [email or web address]  [address] [place], [place] [address]

## 2020-10-24 ENCOUNTER — Encounter: Admit: 2020-10-24 | Discharge: 2020-10-24 | Payer: MEDICARE

## 2020-10-24 DIAGNOSIS — J189 Pneumonia, unspecified organism: Secondary | ICD-10-CM

## 2020-10-24 DIAGNOSIS — N186 End stage renal disease: Secondary | ICD-10-CM

## 2020-10-24 NOTE — Telephone Encounter
TRANSPLANT BENEFIT COLLECTION:    TRANSPLANT TYPE: Kidney/Pancreas    Verified by: Kennon Rounds   Date: 10/24/2020  PRIMARY  ID #: [id number]   EFF: 06/05/2020   Subscriber: Self  Ins Plan: Medicare A&B   Phone#: [phone]   Plan Type: Traditional  MEDICARE A&B - 2022 BENEFIT SUMMARY:  PART A: DAYS REFRESH AFTER 60 CONSECUTIVE OUTPT DAYS   * $1,556 deductible for each benefit period   * Days 1-60: $0 coinsurance for each benefit period   * Days 61-90: $389 coinsurance per day of each benefit period   * Days 91 and beyond: $778 coinsurance per each lifetime reserve day after      day 90 for each benefit period (up to 60 days over your lifetime)   * Beyond lifetime reserve days: all costs  PART A/ SNF BENEFITS: IN 2022: DAYS 1-20 PT PORTION $0; DAYS 21-100 $185.50/DAY COPAY; DAYS > 100 PT PORTION 100%  PART B / BENEFITS IN 2022:  DEDUCTIBLE $233 WITH 80% REIMBURSEMENT OF ALLOWABLE, NO OUT OF POCKET MAXIMUM  MEDICARE WILL PAY FOR DRUGS INFUSED THROUGH AN ITEM OF DME, LIKE AN INFUSION PUMP, OR DRUGS GIVEN BY A NEBULIZER WHEN GIVEN BY A LICENSED MEDICAL PROVIDER.  PART B WILL COVER IMMUNOSUPPRESSIVE DRUGS IF MC COVERED THE TRANSPLANT, EVEN AS   SECONDARY PAYER  No Case Management, No transplant Network, No prior authorizations, No benefit limits  Passport [employer] Ref#: [id number]    PART D   RX Plan: [employer]   Phone #: [phone]  Spoke to: Alvan Dame   Call Reference#: [id number]  Current Subsidy: Full Extra Help  [place]: Not Covered  Generic: $3.70    SECONDARY  ID #: [id number]   EFF: 07/17/2020   Subscriber: Self  Ins Plan: UHC [employer]   Plan Type: [place] Medicaid  K[place]ARE (prev [place] Medicaid): No Spenddown as of 10/24/2020  $48 INPT ADMIT COPAY  NURSING HOME/LONG TERM CARE [phone]  OUTPT HOSPITAL $3/VISIT  SURGICENTER $3/DOS  OUTPT REHAB $1/VISIT  DME $3/CLAIM  HHC $3/VISIT  NON-EMERGENCY TRANSPORT OR AMBULANCE $3/DOS  OV $2/COPAY  MENTAL HEALTH CALL [facility] [phone], $3/VISIT  DENTAL $3/DOS  RX $3/FILL (NO MAIL ORDER)  If 2ndry to Medicare: PAYS [place] FOR IMMUNOS AFTER PART B  **IS IT COVERED? CALL [phone]  Passport [employer] Ref#: [id number]    TXP Network: Medicare    Auth requirements: NO AUTHORIZATION REQUIRED FOR EVALUATION OR LISTING    TRANSPLANT AUTHORIZATIONS  ?  PLAN: OPTUM?AUTHORIZATION Phase 1 Evaluation  Auth #:?[id number]?eff:?08/16/2019 - 08/15/2020  NCM:?Armida Hall  Phone #:?[phone]??FAX #: [phone]  ?  PLAN:OPTUM???AUTHORIZATION Phase 2 Listing  Auth #:??[id number]?eff:?08/16/2019 - no expiration as long as active with plan  NCM:?Armida Margo Aye  Phone #:?[phone]??FAX #: [phone]

## 2020-10-25 ENCOUNTER — Encounter: Admit: 2020-10-25 | Discharge: 2020-10-25 | Payer: MEDICARE

## 2020-10-25 DIAGNOSIS — I10 Essential (primary) hypertension: Secondary | ICD-10-CM

## 2020-10-25 DIAGNOSIS — R413 Other amnesia: Secondary | ICD-10-CM

## 2020-10-25 DIAGNOSIS — K3184 Gastroparesis: Secondary | ICD-10-CM

## 2020-10-25 DIAGNOSIS — E119 Type 2 diabetes mellitus without complications: Secondary | ICD-10-CM

## 2020-10-25 DIAGNOSIS — I639 Cerebral infarction, unspecified: Secondary | ICD-10-CM

## 2020-10-25 DIAGNOSIS — N289 Disorder of kidney and ureter, unspecified: Secondary | ICD-10-CM

## 2020-10-30 ENCOUNTER — Ambulatory Visit: Admit: 2020-10-30 | Discharge: 2020-10-30 | Payer: MEDICARE

## 2020-10-30 ENCOUNTER — Encounter: Admit: 2020-10-30 | Discharge: 2020-10-30 | Payer: MEDICARE

## 2020-10-30 DIAGNOSIS — J189 Pneumonia, unspecified organism: Secondary | ICD-10-CM

## 2020-10-30 DIAGNOSIS — Z01818 Encounter for other preprocedural examination: Secondary | ICD-10-CM

## 2020-10-30 DIAGNOSIS — N186 End stage renal disease: Secondary | ICD-10-CM

## 2020-10-30 DIAGNOSIS — R918 Other nonspecific abnormal finding of lung field: Secondary | ICD-10-CM

## 2020-10-30 LAB — MISC REFERENCE TEST

## 2020-11-05 ENCOUNTER — Encounter: Admit: 2020-11-05 | Discharge: 2020-11-05 | Payer: MEDICARE

## 2020-11-05 NOTE — Telephone Encounter
Called Brandy Joseph to let her know that her oxalate level came back normal. She looks to be cleared by the pulmonary team. I just needed to know the name of the infectious disease doctor that she saw locally about the blood infection. She was looking through her phone for the name. She couldn't find it so she is going to e-mail me once she finds it.

## 2020-11-15 ENCOUNTER — Encounter: Admit: 2020-11-15 | Discharge: 2020-11-15 | Payer: MEDICARE

## 2020-11-15 DIAGNOSIS — Z01818 Encounter for other preprocedural examination: Secondary | ICD-10-CM

## 2020-11-15 DIAGNOSIS — N186 End stage renal disease: Secondary | ICD-10-CM

## 2020-11-15 NOTE — Telephone Encounter
Tess called and left the name of the Endocrinologist that Samatha has seen recently.    Dr. Dan Maker Al-Mubaslat  Endocrinologist  Address: [address], [place] [address]  ~5.8 mi  Phone: [phone]    Tess gave the phone number for their office as [phone]

## 2020-11-15 NOTE — Telephone Encounter
[facility] and explained that we needed her to obtain Infectious disease clearance and lower her A1c prior to bringing her into clinic for another evaluation. She stated understanding. I would call her back with the teams she needed to make an appointment with.    Called Dr. Perry Mount office and obtained information needed for a referral in order to obtain clearance. Referral and records faxed to the below office.    Levonne Lapping, MD      Internal medicine-Infectious disease  Address: [address] [place], [place] [address] ? ~60.8 mi  Phone: [phone]  Fx: [phone]    Called dialysis and spoke with Jacki Cones, who transferred me to Tania to check on a recent A1c. She could not find an A1c and had looked back to February. RN to follow up with Endocrine.    Called Endocrine office and she had not been seen since November. Patient needs to call to set up an appointment.    Received a return call from Dr. Judithe Modest requesting a return call to [phone] (personal #). Spoke with Dr. Judithe Modest and he was suggesting that since the suspected source of the infection was removed and they treated with antibiotics, that she should be fine. We could repeat blood cultures since time has passed since the completion of antibiotics to ensure the microbian is gone but that is all he would do.   RN passed these recommendations onto Dr. Chales Abrahams. RN gave her his phone number as well so she could reach out to clarify anything further with the rare bacterium the patient had.    Called Ama to let her know to make a follow up appointment with her Endocrine office. Gave her the number and she was going to call and make the appointment. I explained the above about the ID physician's recommendations. I am still waiting to hear from Dr. Chales Abrahams if we need to pursue further. She also mentioned that the pharmacy just let her know that she has another order of antibiotics to puck up that she is unfamiliar with. I told her to call the pharmacy to find out who was prescribing them so we could figure out why she is on them vs maybe an error in prescription. She is going to do that.    Called Tess and she let me know that they changed the Endocrinologist and has seen a new one in the last month. They changed due to the previous team not seeming to know how to manage her or use the CGM.  She did let me know that Trinia had been admitted recently due to her BP getting high one evening and then she started vomiting. She was taken to the hospital [facility]). Later Annina revealed that she had gotten into her whirlpool tub and couldn't get out. She panicked and that is was they thought her BP spiked. She had repeat imaging and blood work since her arm was swollen on the arm of her new fistula. She has the antibiotics for possible continued UTI treatment from the inpatient setting. They are going to take her to her PCP to follow up on. She will call me back to let me know the name of the new Endocrinologist.

## 2020-12-06 ENCOUNTER — Encounter: Admit: 2020-12-06 | Discharge: 2020-12-06 | Payer: MEDICARE

## 2020-12-06 NOTE — Telephone Encounter
Called patient's sister in law Tess ti try and get her scheduled for an up date appt no answer left message for Tess to call back.

## 2020-12-16 ENCOUNTER — Encounter: Admit: 2020-12-16 | Discharge: 2020-12-16 | Payer: MEDICARE

## 2020-12-26 ENCOUNTER — Ambulatory Visit: Admit: 2020-12-26 | Discharge: 2020-12-26 | Payer: MEDICARE

## 2020-12-26 ENCOUNTER — Encounter: Admit: 2020-12-26 | Discharge: 2020-12-26 | Payer: MEDICARE

## 2020-12-26 DIAGNOSIS — Z114 Encounter for screening for human immunodeficiency virus [HIV]: Secondary | ICD-10-CM

## 2020-12-26 DIAGNOSIS — I10 Essential (primary) hypertension: Secondary | ICD-10-CM

## 2020-12-26 DIAGNOSIS — N186 End stage renal disease: Secondary | ICD-10-CM

## 2020-12-26 DIAGNOSIS — Z01818 Encounter for other preprocedural examination: Secondary | ICD-10-CM

## 2020-12-26 DIAGNOSIS — N289 Disorder of kidney and ureter, unspecified: Secondary | ICD-10-CM

## 2020-12-26 DIAGNOSIS — I639 Cerebral infarction, unspecified: Secondary | ICD-10-CM

## 2020-12-26 DIAGNOSIS — F558 Abuse of other non-psychoactive substances: Secondary | ICD-10-CM

## 2020-12-26 DIAGNOSIS — R799 Abnormal finding of blood chemistry, unspecified: Secondary | ICD-10-CM

## 2020-12-26 DIAGNOSIS — R413 Other amnesia: Secondary | ICD-10-CM

## 2020-12-26 DIAGNOSIS — E119 Type 2 diabetes mellitus without complications: Secondary | ICD-10-CM

## 2020-12-26 DIAGNOSIS — K3184 Gastroparesis: Secondary | ICD-10-CM

## 2020-12-26 LAB — LIVER FUNCTION PANEL
ALBUMIN: 4.9 g/dL (ref 3.5–5.0)
ALK PHOSPHATASE: 140 U/L — ABNORMAL HIGH (ref 25–110)
ALT: 37 U/L (ref 7–56)
TOTAL BILIRUBIN: 0.5 mg/dL (ref 0.3–1.2)

## 2020-12-26 LAB — CBC
HEMATOCRIT: 45 % — ABNORMAL HIGH (ref 36–45)
HEMOGLOBIN: 14 g/dL (ref 12.0–15.0)
RBC COUNT: 5.2 M/UL — ABNORMAL HIGH (ref 4.0–5.0)
WBC COUNT: 5.5 K/UL (ref 4.5–11.0)

## 2020-12-26 LAB — HEPATITIS B SURFACE AB: HEP B SURFACE ABY: NEGATIVE

## 2020-12-26 LAB — SYPHILIS AB SCREEN: SYPHILIS AB, TOTAL: NEGATIVE

## 2020-12-26 LAB — PHOSPHORUS: PHOSPHORUS: 3.8 mg/dL (ref 2.0–4.5)

## 2020-12-26 LAB — HEPATITIS B CORE AB TOT (IGG+IGM)

## 2020-12-26 LAB — GGTP: GGTP: 28 U/L (ref 9–64)

## 2020-12-26 LAB — HEPATITIS B SURFACE AG

## 2020-12-26 LAB — PROTIME INR (PT)
INR: 0.9 (ref 0.8–1.2)
PROTIME: 10 s (ref 9.5–14.2)

## 2020-12-26 LAB — HEPATITIS C ANTIBODY W REFLEX HCV PCR QUANT

## 2020-12-26 LAB — HIV 1& 2 AG-AB SCRN W REFLEX HIV 1 PCR QUANT

## 2020-12-26 LAB — TOXOPLASMA IGG: TOXOPLASMA IGG: NEGATIVE

## 2020-12-26 NOTE — Telephone Encounter
FINANCIAL TRANSPLANT EVALUATION      TFA Georgina Snell met with Teressa Lower for insurance consult. There appears to be no coverage/insurance barriers to transplant with Medicare A&B and [facility] plan. Explained to [place] the estimated cost of Post Transplant medications and provided patient with a copy of her liabilities. Annalie expressed no insurance concerns related to cost of post-transplant care and medications and verbalized understanding of Transplant Finance Coordinator's discussion and documents presented. Advised patient to call me if any insurance changes. Provided patient with my contact information. Patient verbalized understanding. No Authorization required prior to listing.    Documents presented:  Signed Patient Liability Form, Medication Cost Estimate.  PATIENT HAS BEEN ADVISED THAT AS A TRANSPLANT PATIENT SHE WILL NOT QUALIFY FOR FINANCIAL ASSISTANCE. Patient verbalized understanding.    *Financially Cleared  *NO Financial Concerns Meryl Crutch        [facility] 12/26/2020

## 2020-12-27 ENCOUNTER — Encounter: Admit: 2020-12-27 | Discharge: 2020-12-27 | Payer: MEDICARE

## 2020-12-27 NOTE — Telephone Encounter
Called [place] and let her know that I sent the stress test order to [facility]. I gave her the number to the scheduling department. I also reminded her to complete her 3rd dose of her covid vaccine.    Returned a call to [employer] as well. I updated her on the testing that is needed to be updated. I also spoke to her in detail about a plan for dose changes after transplant. I offered the use of MyChart and giving proxy access to the family members that will be helping so they can ensure that the doses are changed and done correctly. She stated she would talk to [place] about it.

## 2021-01-01 ENCOUNTER — Encounter: Admit: 2021-01-01 | Discharge: 2021-01-01 | Payer: MEDICARE

## 2021-01-01 NOTE — Telephone Encounter
[place] has been verified though [employer].  As of 01/01/2021 patient has ACTIVE   Primary Medicare A&B. Ref.#: [id number]  Secondary [place]. Ref.#[id number]  *No Financial Assessment Needed  *Listing Auth on File with No Exp.  *No Financial Concerns/Barriers

## 2021-01-01 NOTE — Committee Review
Committee Review Note: Kidney     Evaluation Date: 09/05/2019  Committee Review Date: 01/01/2021    Organ being evaluated for: Kidney    Transplant Phase: Evaluation  Transplant Status:  Deferred    Transplant Coordinator: Ralph Dowdy  Transplant Surgeon:       Referring Physician: Lorre Munroe    Primary Diagnosis: DM I   Secondary Diagnosis:     Committee Review Members:  Dietician, Registered Margarita Grizzle, RD   Financial Counseling and Assistance Services CCSC Mayra Delilah Shan   Independent Donor Advocate Ellison Carwin, LMSW   Nephrology Diane Dortha Kern, MD, Tomasa Hosteller Anselm Lis, MD, Seth Bake, MD, Corena Herter, MD, Alessandra Bevels, APRN-NP   Pharmacist Lonzo Cloud, [place]   Social Worker Reginia Naas, LMSW   Social Worker, Clinical [facility]   Transplant Services [place] Schumacher, RN, Alger Simons, RN, Suann Larry, RN       Transplant Eligibility: Adults age 41 or older.  A minor may be considered on an individual basis, End Stage Renal Disease as defined by CrCl or GFR of less than or equal to 20 mL/min; or currently on dialysis, Acceptable surgical/medical risks as determined by the transplant team, Emotional and socio-psychological stability and demonstrated adherence to the recommended medical regimen, Financial resources for transplant and post-transplant management    Committee Review Decision: Approved     Relative Contraindications:     Absolute Contraindications:     Committee Discussion Details: Patient previously presented to committee 07/17/2020 - approved list for Kidney/Pancreas transplant. Patient was hospitalized for prolonged illness after approval, then needed additional follow-up clearance. Returned to transplant clinic 12/26/2020 for evaluation updates to assess functional status. Transplant nephrology and surgery agreed patient to be a marginal candidate for dual organ transplant SPK due to decreased functional status following hospitalization and prior CVA. Patient is using walker with wheels. Discussion between transplant nephrologists/surgeons who determined patient would be an acceptable candidate for kidney transplant alone. Patient is declined for SPK. Approved for kidney alone.   ?  Nephrology: Patient was seen by Dr. Chales Abrahams for evaluation updates. Noted decrease in functional status, using roller/walker. Reviewed echocardigram, stress test and angiogram - normal coronaries from July 2021. Reviewed current mammogram and colonoscopy. 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 Seen by Neuropsychology for evaluation d/t memory loss. Patient uses white-board to assist with memory. Hgb A1C was 10.5 with lab drawn at evaluation. Prior level was 7.2. Encourage blood glucose control, recommend daily glucose levels <170. Needs to update stress test in July. Acceptable to list active for renal transplant. Surgery: Acceptable surgical candidate for kidney transplant. Social Work: Concern about memory challenges, med changes to be communicated to primary support/DPOA. Noted increase in Hgb A1C. Pharmacy: Nothing additonal Dietician: Elevated Hgb A1c. TFA: [employer] and [employer]. No Financial Assessment Needed. Listing Auth on File with No Exp. No Financial Concerns/Barriers.     cPRA: 78%. Most recent sample: 12/26/2020    Action Items: Encourage improved DM management and update stress test    Transplant coordinator to request: Update ischemic testing    Does patient have PVD?: Yes     Financial Plan Submitted?: Not applicable     Financial Plan Approved?: Not applicable    Is Financial Auth Needed?: Yes - Listing AUTH on file    Recommended for KDPI > 85%:  Yes    Recommended for [place] Kidneys: No    Recommended for 2:1 Kidney Offer: No    Does patient need to return to  committee?: No     Reason: N/A

## 2021-01-01 NOTE — Committee Review
Committee Review Note: Kidney/Pancreas     Evaluation Date: 09/05/2019  Committee Review Date: 01/01/2021    Organ being evaluated for: Kidney/Pancreas    Transplant Phase: Evaluation  Transplant Status:  Deferred    Transplant Coordinator: Ralph Dowdy  Transplant Surgeon:       Referring Physician: Lorre Munroe    Primary Diagnosis: DM I    Secondary Diagnosis:     Committee Review Members:  Dietician, Registered Margarita Grizzle, RD   Financial Counseling and Assistance Services CCSC Mayra Delilah Shan   Independent Donor Advocate Ellison Carwin, LMSW   Nephrology Diane Dortha Kern, MD, Tomasa Hosteller Anselm Lis, MD, Seth Bake, MD, Corena Herter, MD, Alessandra Bevels, APRN-NP   Pharmacist Lonzo Cloud, [place]   Social Worker Reginia Naas, LMSW   Social Worker, Clinical [facility]   Transplant Services [place] Schumacher, RN, Alger Simons, RN, Suann Larry, RN       Transplant Eligibility: Adults age 54 or older.  A minor may be considered on an individual basis, Presence of diagnosed DM as evidenced by one of the following:  a. On insulin & C-peptide less than or equal to 2 ng/mL  b. On insulin & C-peptide greater than 2 ng/mL & BMI less than or equal to 28, Diabetic nephropathy (End Stage Renal Disease as evidenced by GFR of less than or equal to 20 mL/min; or currently on dialysis), Acceptable surgical/medical risks as determined by the transplant team, Emotional and socio-psychological stability and demonstrated adherence to the recommended medical regimen, Financial resources for transplant and post-transplant management, Hyper labile diabetes including but not limited to episodes of ketoacidosis, frequent hypoglycemia or hypoglycemia unawareness, infections, and impairment in quality of life    Committee Review Decision: Declined    Relative Contraindications:     Absolute Contraindications:     Committee Discussion Details: Patient was previously presented to committee 07/17/2020. She was approved list for Kidney/Pancreas transplant. Patient was hospitalized for prolonged illness, then needed additional follow-up clearance. Returned to transplant clinic for evaluation updates to determine functional status. Both nephrology and surgery agreed patient is a marginal candidate for dual organ transplant SPK due to decreased functional status following hospitalization and prior CVA. Patient is using walker with wheels. Discussion between transplant nephrologists and surgeon who determined patient would be an acceptable candidate for kidney transplant alone. Patient is declined for SPK. Approved to list for kidney alone. See additional committee note for kidney transplant.

## 2021-01-10 ENCOUNTER — Encounter: Admit: 2021-01-10 | Discharge: 2021-01-10 | Payer: MEDICARE

## 2021-01-10 NOTE — Telephone Encounter
01/10/2021 3:43 PM   Received a return call from Brandy Joseph. Called her back and explained the committee decision in regard to being denied at this time for a pancreas and approved for a kidney. We went over deconditioning, high antibody level and SPK vs K surgery length of stay and difficulties. We talked about being room for improvement and possibility of being able to change back to SPK or do PAK. We also talked about controlling her diabetes since it showed an increase during her visit. She stated understanding. She was emotional. I told her that I sent her for active kidney listing. I would call her back once she was active. I also told her that I would talk to the provider on Monday and see if she could call her and explain things better to her understanding.

## 2021-01-10 NOTE — Telephone Encounter
Called and left a message for Brandy Joseph requesting a return call in regards to the committee decision.    Called Brandy Joseph and we discussed the denial for SPK at this time due to deconditioning and poor functional status. We approved her for listing for kidney along. We talked about possible pancreas after kidney transplant and possibility of room for improvement and changing back to SPK in the future. We also talked in length about her antibody level of 78%. All questions answered. Brandy Joseph thought that Brandy Joseph had already completed the stress test. RN to request record.    3rd COVID vaccine confirmed in chart.  Committee approval to list for kidney alone.  *No Financial Assessment Needed  *Listing Auth on File with No Exp.  *No Financial Concerns/Barriers  Most recent MTN sample is from 12/26/20  Double ABO verification completed  MTN form populated and securely e-mailed to MTN

## 2021-01-13 ENCOUNTER — Encounter: Admit: 2021-01-13 | Discharge: 2021-01-13 | Payer: MEDICARE

## 2021-01-13 NOTE — Telephone Encounter
[place] to let her know that they are active on the wait list. We discussed current wait time, right to decline and need to respond to organ offers with in an hour. Also discussed informing coordinator of insurance, demographics, medical or dialysis changes.Patient stated understanding. We discussed the need to send in a monthly antibody level. I informed the patient that they were to receive living donor information in the letter if they have anyone that it is interested in donating. Gave the contact information for our living donation team so they can pass it on to interested donors. Questions answered, patient stated understanding.    Listing verified in [place]  Transplant tab changed to Active  Letter drafted and given to MA to mail  Notification e-mail sent  Waitlist information and donor acceptance complete in the transplant tab

## 2021-01-14 ENCOUNTER — Encounter: Admit: 2021-01-14 | Discharge: 2021-01-14 | Payer: MEDICARE

## 2021-01-14 NOTE — Telephone Encounter
Tess called to follow up on the organ offer that was offered to Brandy Joseph early this morning. It caught everyone off guard a little bit so they are wanting to ask some questions for clarification. We went over all of her questions. The family wants to better prepare on how they will organize to help Brandy Joseph through her transplant offers and getting to the hospital. They have a group message going to plan how they can support her the best. Answered all questions and when someone should leave work and when they should not. We updated the order of calling for organ offers to help provide the care. Updated chart to reflect changes. Tess will update the family on the information shared today.

## 2021-01-14 NOTE — Telephone Encounter
Notified pt that organs went to people ahead of her on list.  She is off standby status.  Questions answered.

## 2021-01-14 NOTE — Telephone Encounter
Received organ offer from [facility] number [id number] / [employer] number [phone].  Reviewed donor summary and match run list.  Organ donor has KDPI of 47%, patient notified.  Organ offer is donation after circulatory death.  Reviewed recipients Evaluation Education Agreement form.  Brandy Joseph consented to Brandy Joseph less than 85%.  Brandy Joseph is back up status.  Reviewed recipient's history.  Notified Brandy Joseph of organ offer, organ type and current status on list.  Brandy Joseph verbalized agreement to proceed with organ offer.  Assessment negative for changes in medical status, social status, insurance changes, and use of anticoagulants. Brandy Joseph notified to be on standby status and given coordinators contact information.      Is organ donor COVID Positive? No

## 2021-01-17 ENCOUNTER — Encounter: Admit: 2021-01-17 | Discharge: 2021-01-17 | Payer: MEDICARE

## 2021-01-17 NOTE — Telephone Encounter
Returned Brandy Joseph's call and answered all of her questions.    Returned Brandy Joseph's call and left a voicemail in regards to not having the crossmatch results from the previous donor offer.

## 2021-01-24 ENCOUNTER — Encounter: Admit: 2021-01-24 | Discharge: 2021-01-24 | Payer: MEDICARE

## 2021-01-24 NOTE — Telephone Encounter
Returned a call to Tess in regards to questions about LD process. All questions answered. LD numbers given. Dann's boyfriend is interested in donating to her. Tess is going to be available in the future to become more of a primary support if her boyfriend donates.

## 2021-01-28 ENCOUNTER — Encounter: Admit: 2021-01-28 | Discharge: 2021-01-28 | Payer: MEDICARE

## 2021-01-28 NOTE — Telephone Encounter
Returned [employer]. All testing is up to date as of today. She is working on lowering her A1c. Will reach back out if any questions arise.

## 2021-02-04 ENCOUNTER — Encounter: Admit: 2021-02-04 | Discharge: 2021-02-04 | Payer: MEDICARE

## 2021-02-04 NOTE — Telephone Encounter
Received voicemail from [facility] in regards to the following patient and being considered for an SPK vs a kidney alone. Had questions on how to get the patient back to a point where she could be considered for an SPK if at all possible.    Replied to [place], [place] via e-mail which was offered in her voicemail message.    Good afternoon Malachy Mood,    I received your voicemail in regards to [facility]. She was denied for kidney pancreas due to not one thing ruling her out but a multi-factorial reason. Is the Nephrologist requesting her to be possibly re-evaluated for SPK or is this the patient asking?    Thank you,    Orvis Brill, BSN, RN, CCTC  Lead Kidney & Pancreas Clinical Nurse Coordinator   [facility]  Phone [phone] - Fax [phone] - [email or web address]  [address] [place], [place] [address]

## 2021-02-27 ENCOUNTER — Encounter: Admit: 2021-02-27 | Discharge: 2021-02-27 | Payer: MEDICARE

## 2021-04-15 ENCOUNTER — Encounter: Admit: 2021-04-15 | Discharge: 2021-04-15 | Payer: MEDICARE

## 2021-04-15 NOTE — Telephone Encounter
Brandy Joseph called and left a voicemail wanting to check in on her status on the wait list.    I returned her call and left a detailed message that everything was good on our end. We have her active and are just waiting for a donor to become available for her. Left my call back number for any further questions.

## 2021-04-21 ENCOUNTER — Encounter: Admit: 2021-04-21 | Discharge: 2021-04-21 | Payer: MEDICARE

## 2021-04-21 NOTE — Telephone Encounter
TC advised patient LVM to see her waitlist status.  Referenced [facility] and advised patient she was active on the list.  She asked when testing needed to be completed and I advised it looked like she needed a new mammogram in December. She verbalized understanding and asked how list worked for where she was.  Explained allocation and she had no other questions.

## 2021-05-07 ENCOUNTER — Encounter: Admit: 2021-05-07 | Discharge: 2021-05-07 | Payer: MEDICARE

## 2021-05-07 ENCOUNTER — Inpatient Hospital Stay: Admit: 2021-05-07 | Discharge: 2021-05-07 | Payer: MEDICARE

## 2021-05-07 ENCOUNTER — Inpatient Hospital Stay: Admit: 2021-05-07 | Payer: MEDICARE

## 2021-05-07 DIAGNOSIS — I639 Cerebral infarction, unspecified: Secondary | ICD-10-CM

## 2021-05-07 DIAGNOSIS — Z005 Encounter for examination of potential donor of organ and tissue: Secondary | ICD-10-CM

## 2021-05-07 DIAGNOSIS — I10 Essential (primary) hypertension: Secondary | ICD-10-CM

## 2021-05-07 DIAGNOSIS — N289 Disorder of kidney and ureter, unspecified: Secondary | ICD-10-CM

## 2021-05-07 DIAGNOSIS — Z940 Kidney transplant status: Secondary | ICD-10-CM

## 2021-05-07 DIAGNOSIS — E119 Type 2 diabetes mellitus without complications: Secondary | ICD-10-CM

## 2021-05-07 DIAGNOSIS — R413 Other amnesia: Secondary | ICD-10-CM

## 2021-05-07 DIAGNOSIS — N186 End stage renal disease: Secondary | ICD-10-CM

## 2021-05-07 DIAGNOSIS — K3184 Gastroparesis: Secondary | ICD-10-CM

## 2021-05-07 LAB — HEPATITIS B SURFACE AB: HEP B SURFACE ABY: NEGATIVE U/L (ref 7–40)

## 2021-05-07 LAB — URINALYSIS, MICROSCOPIC

## 2021-05-07 LAB — URINALYSIS DIPSTICK
NITRITE: NEGATIVE
URINE ASCORBIC ACID, UA: POSITIVE — AB
URINE BILE: NEGATIVE
URINE BLOOD: NEGATIVE
URINE KETONE: NEGATIVE

## 2021-05-07 LAB — CBC AND DIFF
ABSOLUTE NEUTROPHIL COUNT MANUAL: 6.2 K/UL (ref 1.8–7.0)
HEMATOCRIT: 36 % (ref 36–45)
LYMPHOCYTES: 3 % — ABNORMAL LOW (ref 24–44)
MONOCYTES %: 1 % — ABNORMAL LOW (ref 4–12)
WBC COUNT: 8.2 K/UL (ref 4.5–11.0)
WBC COUNT: 6.4 K/UL (ref 4.5–11.0)

## 2021-05-07 LAB — POTASSIUM, BG
POTASSIUM: 5.5 MMOL/L — ABNORMAL HIGH (ref 3.5–5.1)
POTASSIUM: 4.2 MMOL/L (ref 3.5–5.1)

## 2021-05-07 LAB — BLOOD GASES, ARTERIAL
BICARB, ART(CAL): 24 MMOL/L (ref 21–28)
PH-ART: 7.3 % — ABNORMAL LOW (ref 7.35–7.45)

## 2021-05-07 LAB — BASIC METABOLIC PANEL
ANION GAP: 8 pg (ref 3–12)
BLD UREA NITROGEN: 41 mg/dL — ABNORMAL HIGH (ref 7–25)
CALCIUM: 8.7 mg/dL (ref 8.5–10.6)
CHLORIDE: 102 MMOL/L — ABNORMAL LOW (ref 98–110)
CO2: 26 MMOL/L (ref 21–30)
CREATININE: 3.8 mg/dL — ABNORMAL HIGH (ref 0.4–1.00)
EGFR: 15 mL/min — ABNORMAL LOW (ref >60)
GLUCOSE,PANEL: 151 mg/dL — ABNORMAL HIGH (ref 70–100)
POTASSIUM: 4.2 MMOL/L — ABNORMAL LOW (ref 3.5–5.1)
SODIUM: 136 MMOL/L — ABNORMAL LOW (ref 137–147)

## 2021-05-07 LAB — LIPID PROFILE
CHOLESTEROL: 176 mg/dL — ABNORMAL HIGH (ref <200)
HDL: 68 mg/dL — ABNORMAL HIGH (ref >40)
LDL: 74 mg/dL — ABNORMAL HIGH (ref <100)
NON HDL CHOLESTEROL: 108 mg/dL (ref 8.5–10.6)
VLDL: 25 mg/dL — ABNORMAL HIGH (ref 0.4–1.00)

## 2021-05-07 LAB — HEMOGLOBIN & HEMATOCRIT, BG: HEMOGLOBIN BLOOD GAS: 11 g/dL — ABNORMAL LOW (ref 12.0–15.0)

## 2021-05-07 LAB — SODIUM,BG: SODIUM: 138 MMOL/L (ref 137–147)

## 2021-05-07 LAB — PHOSPHORUS: PHOSPHORUS: 2.8 mg/dL (ref 2.0–4.5)

## 2021-05-07 LAB — HEPATITIS C ANTIBODY W REFLEX HCV PCR QUANT

## 2021-05-07 LAB — PTT (APTT): PTT: 34 s (ref 24.0–36.5)

## 2021-05-07 LAB — PROTIME INR (PT): PROTIME: 10 s — ABNORMAL LOW (ref 9.5–14.2)

## 2021-05-07 LAB — COMPREHENSIVE METABOLIC PANEL
ALT: 26 U/L (ref 7–56)
SODIUM: 134 MMOL/L — ABNORMAL LOW (ref 137–147)
TOTAL PROTEIN: 7.7 g/dL (ref 6.0–8.0)

## 2021-05-07 LAB — GLUCOSE,BG: GLUCOSE,PANEL: 235 mg/dL — ABNORMAL HIGH (ref 70–100)

## 2021-05-07 LAB — HIV 1& 2 AG-AB SCRN W REFLEX HIV 1 PCR QUANT

## 2021-05-07 LAB — POC GLUCOSE
POC GLUCOSE: 317 mg/dL — ABNORMAL HIGH (ref 70–100)
POC GLUCOSE: 290 mg/dL — ABNORMAL HIGH (ref 70–100)
POC GLUCOSE: 341 mg/dL — ABNORMAL HIGH (ref 70–100)
POC GLUCOSE: 190 mg/dL — ABNORMAL HIGH (ref 70–100)
POC GLUCOSE: 207 mg/dL — ABNORMAL HIGH (ref 70–100)
POC GLUCOSE: 159 mg/dL — ABNORMAL HIGH (ref 70–100)
POC GLUCOSE: 216 mg/dL — ABNORMAL HIGH (ref 70–100)

## 2021-05-07 LAB — IONIZED CALCIUM,BG: IONIZED CALCIUM: 1.1 MMOL/L — ABNORMAL HIGH (ref 1.0–1.3)

## 2021-05-07 LAB — PARATHYROID HORMONE: PTH HORMONE: 396 pg/mL — ABNORMAL HIGH (ref <150)

## 2021-05-07 LAB — HEPATITIS B CORE AB TOT (IGG+IGM)

## 2021-05-07 LAB — HEPATITIS B SURFACE AG

## 2021-05-07 LAB — MAGNESIUM: MAGNESIUM: 1.9 mg/dL (ref 1.6–2.6)

## 2021-05-07 MED ORDER — METHYLPREDNISOLONE SOD SUC(PF) 40 MG/ML IJ SOLR
40 mg | Freq: Every day | INTRAVENOUS | 0 refills | Status: AC
Start: 2021-05-07 — End: ?
  Administered 2021-05-10: 18:00:00 40 mg via INTRAVENOUS

## 2021-05-07 MED ORDER — PREDNISONE 5 MG PO TAB
20 mg | Freq: Every day | ORAL | 0 refills | Status: AC
Start: 2021-05-07 — End: ?

## 2021-05-07 MED ORDER — TACROLIMUS 1 MG PO CAP
3 mg | Freq: Two times a day (BID) | ORAL | 0 refills | Status: AC
Start: 2021-05-07 — End: ?
  Administered 2021-05-08 (×2): 3 mg via ORAL

## 2021-05-07 MED ORDER — MANNITOL 25 % 25 % IV SOLN
INTRAVENOUS | 0 refills | Status: DC
Start: 2021-05-07 — End: 2021-05-08
  Administered 2021-05-08: 01:00:00 25 g via INTRAVENOUS

## 2021-05-07 MED ORDER — FLUOXETINE 10 MG PO CAP
10 mg | Freq: Every day | ORAL | 0 refills | Status: DC
Start: 2021-05-07 — End: 2021-05-07

## 2021-05-07 MED ORDER — CLINDAMYCIN IN 5 % DEXTROSE 900 MG/50 ML IV PGBK
INTRAVENOUS | 0 refills | Status: DC
Start: 2021-05-07 — End: 2021-05-08
  Administered 2021-05-08: 900 mg via INTRAVENOUS

## 2021-05-07 MED ORDER — FENTANYL CITRATE (PF) 50 MCG/ML IJ SOLN
25-50 ug | INTRAVENOUS | 0 refills | Status: AC | PRN
Start: 2021-05-07 — End: ?
  Administered 2021-05-08: 05:00:00 25 ug via INTRAVENOUS
  Administered 2021-05-09: 16:00:00 50 ug via INTRAVENOUS

## 2021-05-07 MED ORDER — PROPOFOL INJ 10 MG/ML IV VIAL
INTRAVENOUS | 0 refills | Status: DC
Start: 2021-05-07 — End: 2021-05-08
  Administered 2021-05-07: 150 mg via INTRAVENOUS

## 2021-05-07 MED ORDER — METHYLPREDNISOLONE SOD SUC(PF) 125 MG/2 ML IJ SOLR
80 mg | Freq: Every day | INTRAVENOUS | 0 refills | Status: AC
Start: 2021-05-07 — End: ?
  Administered 2021-05-09: 17:00:00 80 mg via INTRAVENOUS

## 2021-05-07 MED ORDER — HALOPERIDOL LACTATE 5 MG/ML IJ SOLN
1 mg | Freq: Once | INTRAVENOUS | 0 refills | Status: DC | PRN
Start: 2021-05-07 — End: 2021-05-08

## 2021-05-07 MED ORDER — SODIUM CHLORIDE 0.9 % IV SOLP
INTRAVENOUS | 0 refills | Status: DC
Start: 2021-05-07 — End: 2021-05-08
  Administered 2021-05-07: 0 via INTRAVENOUS

## 2021-05-07 MED ORDER — MYCOPHENOLATE SODIUM 360 MG PO TBEC
720 mg | Freq: Once | ORAL | 0 refills | Status: CP
Start: 2021-05-07 — End: ?
  Administered 2021-05-07: 23:00:00 720 mg via ORAL

## 2021-05-07 MED ORDER — ONDANSETRON HCL (PF) 4 MG/2 ML IJ SOLN
4 mg | INTRAVENOUS | 0 refills | Status: AC | PRN
Start: 2021-05-07 — End: ?
  Administered 2021-05-08 (×2): 4 mg via INTRAVENOUS

## 2021-05-07 MED ORDER — SODIUM CHLORIDE 0.45% WITH SODIUM BICARB 10MEQ IV INFUSION
INTRAVENOUS | 0 refills | Status: AC
Start: 2021-05-07 — End: ?
  Administered 2021-05-08 – 2021-05-09 (×8): 1000.000 mL via INTRAVENOUS

## 2021-05-07 MED ORDER — HEPARIN 2500UNITS NS 250ML IRR SOLN (OR)
Freq: Once | 0 refills | Status: CP
Start: 2021-05-07 — End: ?
  Administered 2021-05-08 (×2): 250 mL

## 2021-05-07 MED ORDER — FUROSEMIDE 10 MG/ML IJ SOLN
INTRAVENOUS | 0 refills | Status: DC
Start: 2021-05-07 — End: 2021-05-08
  Administered 2021-05-08: 01:00:00 60 mg via INTRAVENOUS

## 2021-05-07 MED ORDER — LIDOCAINE (PF) 10 MG/ML (1 %) IJ SOLN
.2 mL | INTRAMUSCULAR | 0 refills | Status: DC | PRN
Start: 2021-05-07 — End: 2021-05-08

## 2021-05-07 MED ORDER — NYSTATIN 100,000 UNIT/ML PO SUSP
500000 [IU] | Freq: Four times a day (QID) | ORAL | 0 refills | Status: AC
Start: 2021-05-07 — End: ?
  Administered 2021-05-08 – 2021-05-11 (×13): 500000 [IU] via ORAL

## 2021-05-07 MED ORDER — INSULIN REGULAR HUMAN(#) 1 UNIT/ML IJ SYRINGE
5 [IU] | Freq: Once | INTRAVENOUS | 0 refills | Status: CP
Start: 2021-05-07 — End: ?
  Administered 2021-05-07: 23:00:00 5 [IU] via INTRAVENOUS

## 2021-05-07 MED ORDER — CALCIUM CHLORIDE 100 MG/ML (10 %) IV SOLN
INTRAVENOUS | 0 refills | Status: DC
Start: 2021-05-07 — End: 2021-05-08
  Administered 2021-05-08: 01:00:00 .5 g via INTRAVENOUS

## 2021-05-07 MED ORDER — HYDROMORPHONE (PF) 2 MG/ML IJ SYRG
1 mg | INTRAVENOUS | 0 refills | Status: DC | PRN
Start: 2021-05-07 — End: 2021-05-08
  Administered 2021-05-08 (×2): 1 mg via INTRAVENOUS

## 2021-05-07 MED ORDER — LYMPHOCYTE IMMUNE GLOBULIN (RAB) IVPB (PERIPHERAL)
Freq: Once | INTRAVENOUS | 0 refills | Status: CP
Start: 2021-05-07 — End: ?
  Administered 2021-05-08 (×4): 65.8 mL/h via INTRAVENOUS

## 2021-05-07 MED ORDER — METOCLOPRAMIDE HCL 5 MG/ML IJ SOLN
INTRAVENOUS | 0 refills | Status: DC
Start: 2021-05-07 — End: 2021-05-08
  Administered 2021-05-07: 23:00:00 10 mg via INTRAVENOUS

## 2021-05-07 MED ORDER — SODIUM CHLORIDE 0.45% WITH SODIUM BICARB 10MEQ IV INFUSION
INTRAVENOUS | 0 refills | Status: AC
Start: 2021-05-07 — End: ?
  Administered 2021-05-08 (×2): 1000.000 mL via INTRAVENOUS

## 2021-05-07 MED ORDER — ACETAMINOPHEN 1,000 MG/100 ML (10 MG/ML) IV SOLN
INTRAVENOUS | 0 refills | Status: DC
Start: 2021-05-07 — End: 2021-05-08
  Administered 2021-05-08: 02:00:00 1000 mg via INTRAVENOUS

## 2021-05-07 MED ORDER — INSULIN REGULAR IN 0.9 % NACL 100 UNIT/100 ML (INFUSION)(AM)(OR)
INTRAVENOUS | 0 refills | Status: DC
Start: 2021-05-07 — End: 2021-05-08
  Administered 2021-05-08: 01:00:00 2 [IU]/h via INTRAVENOUS

## 2021-05-07 MED ORDER — ARTIFICIAL TEARS (PF) SINGLE DOSE DROPS GROUP
OPHTHALMIC | 0 refills | Status: DC
Start: 2021-05-07 — End: 2021-05-08
  Administered 2021-05-07: 1 [drp] via OPHTHALMIC

## 2021-05-07 MED ORDER — BELLADONNA ALKALOIDS-OPIUM 16.2-60 MG RE SUPP
1 | RECTAL | 0 refills | Status: AC | PRN
Start: 2021-05-07 — End: ?

## 2021-05-07 MED ORDER — FENTANYL CITRATE (PF) 50 MCG/ML IJ SOLN
12.5 ug | INTRAVENOUS | 0 refills | Status: DC | PRN
Start: 2021-05-07 — End: 2021-05-08
  Administered 2021-05-08 (×8): 12.5 ug via INTRAVENOUS

## 2021-05-07 MED ORDER — SENNOSIDES 8.6 MG PO TAB
1 | Freq: Two times a day (BID) | ORAL | 0 refills | Status: DC
Start: 2021-05-07 — End: 2021-05-07

## 2021-05-07 MED ORDER — SUGAMMADEX 100 MG/ML IV SOLN
INTRAVENOUS | 0 refills | Status: DC
Start: 2021-05-07 — End: 2021-05-08
  Administered 2021-05-08: 02:00:00 300 mg via INTRAVENOUS

## 2021-05-07 MED ORDER — DEXMEDETOMIDINE IN 0.9 % NACL 20 MCG/5 ML (4 MCG/ML) IV SYRG
INTRAVENOUS | 0 refills | Status: DC
Start: 2021-05-07 — End: 2021-05-08
  Administered 2021-05-08: 02:00:00 8 ug via INTRAVENOUS
  Administered 2021-05-08: 02:00:00 4 ug via INTRAVENOUS
  Administered 2021-05-08: 02:00:00 8 ug via INTRAVENOUS

## 2021-05-07 MED ORDER — SODIUM CHLORIDE 0.9 % IV SOLP
1000 mL | INTRAVENOUS | 0 refills | Status: AC
Start: 2021-05-07 — End: ?

## 2021-05-07 MED ORDER — METHYLPREDNISOLONE 500 MG IVPB
500 mg | Freq: Once | INTRAVENOUS | 0 refills | Status: CP
Start: 2021-05-07 — End: ?
  Administered 2021-05-08 (×2): 500 mg via INTRAVENOUS

## 2021-05-07 MED ORDER — HYDROMORPHONE (PF) 2 MG/ML IJ SYRG
.5 mg | INTRAVENOUS | 0 refills | Status: DC | PRN
Start: 2021-05-07 — End: 2021-05-08

## 2021-05-07 MED ORDER — CARVEDILOL 12.5 MG PO TAB
25 mg | Freq: Two times a day (BID) | ORAL | 0 refills | Status: DC
Start: 2021-05-07 — End: 2021-05-07

## 2021-05-07 MED ORDER — PANTOPRAZOLE 40 MG IV SOLR
40 mg | Freq: Every day | INTRAVENOUS | 0 refills | Status: AC
Start: 2021-05-07 — End: ?
  Administered 2021-05-08 – 2021-05-10 (×3): 40 mg via INTRAVENOUS

## 2021-05-07 MED ORDER — LIDOCAINE (PF) 200 MG/10 ML (2 %) IJ SYRG
INTRAVENOUS | 0 refills | Status: DC
Start: 2021-05-07 — End: 2021-05-08
  Administered 2021-05-07: 100 mg via INTRAVENOUS

## 2021-05-07 MED ORDER — SODIUM CHLORIDE 0.45% WITH SODIUM BICARB IV INFUSION
Freq: Once | INTRAVENOUS | 0 refills | Status: DC
Start: 2021-05-07 — End: 2021-05-08

## 2021-05-07 MED ORDER — INSULIN ASPART 100 UNIT/ML SC FLEXPEN
0-12 [IU] | Freq: Before meals | SUBCUTANEOUS | 0 refills | Status: AC
Start: 2021-05-07 — End: ?

## 2021-05-07 MED ORDER — OXYCODONE 5 MG PO TAB
5 mg | Freq: Once | ORAL | 0 refills | Status: CP
Start: 2021-05-07 — End: ?
  Administered 2021-05-08: 04:00:00 5 mg via ORAL

## 2021-05-07 MED ORDER — CEFAZOLIN INJ 1GM IVP
2 g | Freq: Once | INTRAVENOUS | 0 refills | Status: DC
Start: 2021-05-07 — End: 2021-05-08

## 2021-05-07 MED ORDER — FAMOTIDINE (PF) 20 MG/2 ML IV SOLN
INTRAVENOUS | 0 refills | Status: DC
Start: 2021-05-07 — End: 2021-05-08
  Administered 2021-05-07: 23:00:00 20 mg via INTRAVENOUS

## 2021-05-07 MED ORDER — PANTOPRAZOLE 40 MG PO TBEC
40 mg | Freq: Every day | ORAL | 0 refills | Status: DC
Start: 2021-05-07 — End: 2021-05-08

## 2021-05-07 MED ORDER — OXYCODONE 5 MG PO TAB
5 mg | Freq: Once | ORAL | 0 refills | Status: CP | PRN
Start: 2021-05-07 — End: ?
  Administered 2021-05-08: 03:00:00 5 mg via ORAL

## 2021-05-07 MED ORDER — ROCURONIUM 10 MG/ML IV SOLN GROUP
INTRAVENOUS | 0 refills | Status: DC
Start: 2021-05-07 — End: 2021-05-08
  Administered 2021-05-07: 100 mg via INTRAVENOUS

## 2021-05-07 MED ORDER — INSULIN REGULAR HUMAN(#) 1 UNIT/ML IJ SYRINGE
3 [IU] | Freq: Once | INTRAVENOUS | 0 refills | Status: CP
Start: 2021-05-07 — End: ?
  Administered 2021-05-07: 22:00:00 3 [IU] via INTRAVENOUS

## 2021-05-07 MED ORDER — LEVOFLOXACIN IN D5W 250 MG/50 ML IV PGBK
250 mg | INTRAVENOUS | 0 refills | Status: AC
Start: 2021-05-07 — End: ?
  Administered 2021-05-08 – 2021-05-09 (×2): 250 mg via INTRAVENOUS

## 2021-05-07 MED ORDER — BUPROPION XL 150 MG PO TB24
150 mg | Freq: Every morning | ORAL | 0 refills | Status: DC
Start: 2021-05-07 — End: 2021-05-07

## 2021-05-07 MED ORDER — DEXAMETHASONE SODIUM PHOSPHATE 4 MG/ML IJ SOLN
INTRAVENOUS | 0 refills | Status: DC
Start: 2021-05-07 — End: 2021-05-08
  Administered 2021-05-08: 4 mg via INTRAVENOUS

## 2021-05-07 MED ORDER — TAMSULOSIN 0.4 MG PO CAP
.4 mg | Freq: Every day | ORAL | 0 refills | Status: AC
Start: 2021-05-07 — End: ?
  Administered 2021-05-08 – 2021-05-11 (×4): 0.4 mg via ORAL

## 2021-05-07 MED ORDER — SODIUM CHLORIDE 0.45% WITH SODIUM BICARB 50MEQ IV INF (OR)
INTRAVENOUS | 0 refills | Status: DC
Start: 2021-05-07 — End: 2021-05-08
  Administered 2021-05-08 (×2): via INTRAVENOUS

## 2021-05-07 MED ORDER — MYCOPHENOLATE SODIUM 360 MG PO TBEC
720 mg | Freq: Two times a day (BID) | ORAL | 0 refills | Status: AC
Start: 2021-05-07 — End: ?
  Administered 2021-05-08 – 2021-05-11 (×7): 720 mg via ORAL

## 2021-05-07 MED ORDER — HYDROMORPHONE (PF) 2 MG/ML IJ SYRG
INTRAVENOUS | 0 refills | Status: DC
Start: 2021-05-07 — End: 2021-05-08
  Administered 2021-05-08 (×2): .5 mg via INTRAVENOUS

## 2021-05-07 MED ORDER — OXYCODONE 5 MG PO TAB
5-10 mg | ORAL | 0 refills | Status: AC | PRN
Start: 2021-05-07 — End: ?
  Administered 2021-05-08: 08:00:00 10 mg via ORAL
  Administered 2021-05-08 (×2): 5 mg via ORAL
  Administered 2021-05-09: 10 mg via ORAL
  Administered 2021-05-09 (×2): 5 mg via ORAL
  Administered 2021-05-09 – 2021-05-10 (×3): 10 mg via ORAL

## 2021-05-07 MED ORDER — CLONIDINE HCL 0.1 MG PO TAB
.2 mg | Freq: Two times a day (BID) | ORAL | 0 refills | Status: DC
Start: 2021-05-07 — End: 2021-05-07

## 2021-05-07 MED ORDER — POLYETHYLENE GLYCOL 3350 17 GRAM PO PWPK
1 | Freq: Every day | ORAL | 0 refills | Status: DC
Start: 2021-05-07 — End: 2021-05-07

## 2021-05-07 MED ORDER — MIDAZOLAM 1 MG/ML IJ SOLN
INTRAVENOUS | 0 refills | Status: DC
Start: 2021-05-07 — End: 2021-05-08
  Administered 2021-05-07: 2 mg via INTRAVENOUS

## 2021-05-07 MED ORDER — METHYLPREDNISOLONE SOD SUC(PF) 125 MG/2 ML IJ SOLR
120 mg | Freq: Every day | INTRAVENOUS | 0 refills | Status: AC
Start: 2021-05-07 — End: ?
  Administered 2021-05-08: 19:00:00 120 mg via INTRAVENOUS

## 2021-05-07 MED ORDER — DEXTROSE 50 % IN WATER (D50W) IV SYRG
12.5-25 g | INTRAVENOUS | 0 refills | Status: AC | PRN
Start: 2021-05-07 — End: ?

## 2021-05-07 MED ORDER — ROSUVASTATIN 20 MG PO TAB
20 mg | Freq: Every day | ORAL | 0 refills | Status: DC
Start: 2021-05-07 — End: 2021-05-07

## 2021-05-07 MED ORDER — ACETAMINOPHEN 500 MG PO TAB
1000 mg | ORAL | 0 refills | Status: AC
Start: 2021-05-07 — End: ?
  Administered 2021-05-08: 08:00:00 1000 mg via ORAL

## 2021-05-07 MED ORDER — ONDANSETRON HCL (PF) 4 MG/2 ML IJ SOLN
INTRAVENOUS | 0 refills | Status: DC
Start: 2021-05-07 — End: 2021-05-08
  Administered 2021-05-08: 02:00:00 4 mg via INTRAVENOUS

## 2021-05-07 MED ORDER — FENTANYL CITRATE (PF) 50 MCG/ML IJ SOLN
INTRAVENOUS | 0 refills | Status: DC
Start: 2021-05-07 — End: 2021-05-08
  Administered 2021-05-07 – 2021-05-08 (×2): 50 ug via INTRAVENOUS

## 2021-05-07 MED ORDER — PREDNISONE 5 MG PO TAB
30 mg | Freq: Every day | ORAL | 0 refills | Status: AC
Start: 2021-05-07 — End: ?
  Administered 2021-05-11: 16:00:00 30 mg via ORAL

## 2021-05-07 NOTE — Telephone Encounter
Coordinator called Brandy Joseph with an update regarding organ offer from this morning. I spoke to [place] and stated that her crossmatch was negative meaning she can accept a kidney from this donor. I reviewed that her position has moved up and she is now # 2 (primary w/o choice). I stated I will notify her when there is another update and/or if the surgeon is ready to call her in. Brandy Joseph able to verbalize understanding.

## 2021-05-07 NOTE — Anesthesia Pre-Procedure Evaluation
Name: Brandy Joseph      MRN: [id number]     DOB: 1980-10-29     Age: 40 y.o.     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 Cephalexin RASH   ? Ketorolac RASH   ? Sulfa (Sulfonamide Antibiotics) EDEMA     Full body swelling; eye swelling; itchiness        Current Medications    Medication Directions   acetaminophen (TYLENOL) 325 mg tablet Take two tablets by mouth every 4 hours as needed.   buPROPion XL (WELLBUTRIN XL) 150 mg tablet Take 150 mg by mouth every morning. Do not crush or chew.   carvedilol (COREG) 25 mg tablet Take one tablet by mouth twice daily. Take with food.   cetirizine (ZYRTEC) 10 mg tablet Take one-half tablet by mouth three times weekly. Take after HD on HD days   cloNIDine (CATAPRESS) 0.2 mg tablet Take one tablet by mouth twice daily.   docusate (COLACE) 100 mg capsule Take 100 mg by mouth twice daily as needed.   epoetin alfa-epbx (RETACRIT) 10,000 unit/mL injection Inject 1 mL under the skin three times weekly. 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   ferrous sulfate (FEOSOL) 325 mg (65 mg iron) tablet Take one tablet by mouth three times weekly. Take on an empty stomach at least 1 hour before or 2 hours after food.   insulin aspart U-100 (NOVOLOG FLEXPEN) 100 unit/mL (3 mL) injection PEN USE AN INSULIN TO CARB RATIO OF 1 UNIT FOR EVERY 6 GRAMS OF CARBS   Add additional Novolog to this scheduled dose based on the premeal blood sugar. This is the correction factor.               140-180 add 1units              181-220             add 2 units              221-260 add 3units              261-320             add 4 units              321-380             add 5 units              380 or higher add 6 units   insulin lispro(+) (HUMALOG U-100 INSULIN) 100 unit/mL injection Maximum of 50 units daily. Via insulin pump. Use 1:6 insulin to carb ratio and the following correction factor Glucose 140-180 = 1 units Glucose 181-220 = 2 units Glucose 221-260 = 3 units Glucose 261-300 = 4 units Glucose 301-350 =  5 units Glucose 351-400 = 6 units Glucose >400 = 7 units   insulin pen needles (disposable) (BD UF NANO PEN NEEDLES) 32 gauge x 5/32 pen needle Use one each as directed as Needed. Use with insulin injections.   Insulin Syringe-Needle U-100 (BD INSULIN SYRINGE ULTRA-FINE) 1 mL 31 gauge x 5/16 syrg Use with insulin vials as directed   loperamide (IMODIUM A-D) 2 mg capsule Take 2 mg by mouth as Needed for Diarrhea. Take 2 capsules by mouth initially, followed by 1 capsule by mouth after each loose stool up to a maximum of 8 tablets in 24 hours.   Miscellaneous Medical Supply misc Straight catheter for use 4x daily   ondansetron (ZOFRAN) 4 mg tablet Take one tablet by mouth every 6 hours as needed.   pantoprazole DR (PROTONIX) 40 mg tablet Take one tablet by mouth daily.   pyridoxine (VITAMIN B-6) 25 mg tab Take one tablet by mouth daily.   rosuvastatin (CRESTOR) 20 mg tablet Take one tablet by mouth daily.   senna/docusate (SENOKOT-S) 8.6/50 mg tablet Take two tablets by mouth twice daily. Hold for loose stools   tamsulosin (FLOMAX) 0.4 mg capsule Take one capsule by mouth daily after breakfast. Do not crush, chew or open capsules. Take 30 minutes following the same meal each day.         Review of Systems/Medical History      Patient summary reviewed  Nursing notes reviewed  Pertinent labs reviewed    PONV Screening: Female gender and Non-smoker  No history of anesthetic complications  No family history of anesthetic complications      Airway - negative        Pulmonary - negative          9/12 hypoxic respiratory failure      Cardiovascular       Recent diagnostic studies:          echocardiogram and stress test          09/24/2019:    SUMMARY/OPINION:??  1.  This study is normal stress only myocardial perfusion scan with no evidence of significant myocardial ischemia.  There are no high risk prognostic indicators present.    2.  Left ventricular systolic function is normal.    3.  Low risk myocardial perfusion scan. .    09/18/2019:     ? LVEF=70%.  ? Normal LV size with hyperdynamic systolic function.  ? No regional wall motion abnormalities.  ? Right ventricular size and function are normal.  ? Normal sized atria.   ? The aortic and pulmonic valves were not well visualized on this study.  ? No significant valvular stenosis or regurgitation by Doppler studies.  ? Trivial pericardial effusion without hemodynamic compromise.   ? Unable to accurately estimate PA systolic pressure.   ?  No significant interval changes when compared with the echocardiogram completed on 09/11/10.      Exercise tolerance: <4 METS       Beta Blocker therapy: Yes      Beta blockers within 24 hours: Yes        Hypertension, well controlled      01/2020: normal LHC      GI/Hepatic/Renal          Renal disease (AKI on CRI, hydronephrosis 2/2 retention, baseline Creatinine ~2.0): CRI, ARF and dialysis           Date of last dialysis: 05/05/2021      Bowel prep  No nausea      Hx of gastroparesis. 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    Abdominal Findings:         Abdominal exam deferred    Neurological Findings:       Alert and oriented x 3      Altered mental status: Acute encephalopathy, delirium, does not remember any details of hospitalizations.    Constitutional findings:       No acute distress      Well-nourished       Diagnostic Tests  Hematology:   Lab Results   Component Value Date    HGB 14.7 12/26/2020    HCT 45.7 12/26/2020    PLTCT 202 12/26/2020    WBC 5.5 12/26/2020    NEUT 55 12/21/2019    ANC 3.46 01/15/2020    LYMPH 8 10/02/2019    ALC 1.70 01/15/2020    MONA 8 01/15/2020    AMC 0.45 01/15/2020    EOSA 0 12/21/2019    ABC 0.02 12/21/2019    BASOPHILS 1 10/02/2019    MCV 86.9 12/26/2020    MCH 28.0 12/26/2020    MCHC 32.2 12/26/2020    MPV 8.7 12/26/2020    RDW 14.7 12/26/2020         General Chemistry:   Lab Results   Component Value Date    NA 134 01/18/2020    K 5.5 05/07/2021    CL 101 01/18/2020    CO2 23 01/18/2020    GAP 10 01/18/2020    BUN 22 01/18/2020    CR 2.17 01/18/2020    GLU 370 01/18/2020    GLU 202 08/11/2007    CA 8.9 01/18/2020    ALBUMIN 4.9 12/26/2020    LACTIC 0.6 10/14/2019    OBSCA 1.20 03/25/2018    MG 1.7 10/27/2019    TOTBILI 0.5 12/26/2020    PO4 3.8 12/26/2020      Coagulation:   Lab Results   Component Value Date    PT 10.4 12/26/2020    PTT 32.1 09/28/2019    INR 0.9 12/26/2020         Anesthesia Plan    ASA score: 3   Plan: general and invasive monitoring  Induction method: intravenous  NPO status: acceptable  Informed Consent  Anesthetic plan and risks discussed with patient.  Use of blood products discussed with patient  Blood Consent: consented      Plan discussed with: CRNA and anesthesiologist.

## 2021-05-07 NOTE — Progress Notes
RT Adult Assessment Note    NAME:Brandy Joseph             MRN: [id number]             DOB:Aug 03, 1980          AGE: 40 y.o.  ADMISSION DATE: 05/07/2021             DAYS ADMITTED: LOS: 0 days    RT Treatment Plan:       Protocol Plan: Procedures  Oxygen/Humidity: O2 to keep SpO2 > 95%, if less than 24 hours post op, then keep SpO2 greater than 92%  SpO2: BID & PRN    Additional Comments:  Impressions of the patient: Pt laying in bed, no distress noted.  Intervention(s)/outcome(s): See Rt treatment plan.  Patient education that was completed: NA  Recommendations to the care team: NA    Vital Signs:  Pulse: 106  RR: 25 PER MINUTE  SpO2: 98 %  O2 Device: None (Room air)  Liter Flow:    O2%:  (RA)  Breath Sounds:    Respiratory Effort: Non-Labored

## 2021-05-07 NOTE — Telephone Encounter
Kristain instructed to come to hospital for admission for potential transplant.  Crossmatch negative.  Instructions given to [facility] for NPO status, admitting location and items to bring from home including all medications in the bottles and CGM/insulin pump supplies. Coordinator .  K[place]na verbalized understanding of instructions.   ETA is 1400  hours. Patient to be admitted to same day surgery.  Admitting notified.   OR notified. Surgery resident notified. Unit [address] charge nurse notified.  Transplant team notified and e-mail sent.

## 2021-05-07 NOTE — H&P (View-Only)
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 Caprice Kluver  Consult Performed by: Lynnda Shields, DO    ASSESSMENT: 40 y.o. 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 Caprice Kluver, who directed plan of care  _____________________________________________________________________________    HPI: Brandy Joseph is a 40 y.o. 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 Patient has been on dialysis since 2021. In the last week, she has had increased dialysis need to MWF from preivous MF due to fluid retention. She denies fever, chills, shortness of breath, chest pain, nausea, vomiting. She endorses constipation which she believes is due to her medications. No history of MI. History of CVA in 2019. Not on blood thinner. No history of DVT/PE. Able to walk with issues, uses a walker to ambulate outside of home. No smoking history. Minimal, social EtOH use. No drug use.  Minimal narcotic use.  PSH: laparoscopic cholecystectomy, C section, tubal ligation, LUE AVF.    Medical History:   Diagnosis Date   ? DM (diabetes mellitus) (HCC)    ? Gastroparesis    ? Hypertension    ? Kidney disease    ? Memory loss    ? Stroke [facility])      Surgical History:   Procedure Laterality Date   ? ANKLE SURGERY Left 1996   ? FIBULA FRACTURE SURGERY Left 1996   ? ESOPHAGOGASTRODUODENOSCOPY WITH BIOPSY - FLEXIBLE N/A 03/29/2018    Performed by Virgina Organ, MD at [facility] ENDO   ? ESOPHAGOGASTRODUODENOSCOPY WITH BIOPSY - FLEXIBLE N/A 10/04/2019    Performed by Everardo All, MD at [facility] ENDO   ? COLONOSCOPY WITH BIOPSY - FLEXIBLE N/A 10/04/2019    Performed by Everardo All, MD at [facility] ENDO   ? ANGIOGRAPHY CORONARY ARTERY WITH LEFT HEART CATHETERIZATION N/A 01/18/2020    Performed by Reola Mosher, MD at [facility] CATH LAB   ? POSSIBLE PERCUTANEOUS CORONARY STENT PLACEMENT WITH ANGIOPLASTY N/A 01/18/2020    Performed by Reola Mosher, MD at [facility] CATH LAB   ? COLONOSCOPY WITH BIOPSY - FLEXIBLE N/A 05/15/2020    Performed by Eliott Nine, MD at [facility] ENDO   ? COLONOSCOPY WITH SNARE REMOVAL TUMOR/ POLYP/ OTHER LESION  05/15/2020    Performed by Eliott Nine, MD at [facility] ENDO   ? BURR HOLE      release pressure from stroke   ? DIALYSIS FISTULA CREATION     ? GALLBLADDER SURGERY     ? HX CESAREAN SECTION       Family History   Problem Relation Age of Onset   ? Stroke Mother    ? Diabetes Paternal Grandmother    ? Heart problem Paternal Grandmother    ? Hypertension Paternal Grandmother    ? Tumor Maternal Grandmother         brain   ?  None Reported Son      Social History     Tobacco Use   ? Smoking status: Never Smoker   ? Smokeless tobacco: Never Used   Substance Use Topics   ? Alcohol use: Not Currently     Alcohol/week: 0.0 standard drinks     Comment: rare, socially     Your Current Medications:       Instructions    acetaminophen (TYLENOL) 325 mg tablet Take two tablets by mouth every 4 hours as needed.    buPROPion XL (WELLBUTRIN XL) 150 mg tablet Take 150 mg by mouth every morning. Do not crush or chew.    carvedilol (COREG) 25 mg tablet Take one tablet by mouth twice daily. Take with food.    cetirizine (ZYRTEC) 10 mg tablet Take one-half tablet by mouth three times weekly. Take after HD on HD days    cloNIDine (CATAPRESS) 0.2 mg tablet Take one tablet by mouth twice daily.    docusate (COLACE) 100 mg capsule Take 100 mg by mouth twice daily as needed.    epoetin alfa-epbx (RETACRIT) 10,000 unit/mL injection Inject 1 mL under the skin three times weekly. 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    ferrous sulfate (FEOSOL) 325 mg (65 mg iron) tablet Take one tablet by mouth three times weekly. Take on an empty stomach at least 1 hour before or 2 hours after food.    FLUoxetine (PROZAC) 10 mg capsule Take 10 mg by mouth daily.    insulin aspart U-100 (NOVOLOG FLEXPEN) 100 unit/mL (3 mL) injection PEN USE AN INSULIN TO CARB RATIO OF 1 UNIT FOR EVERY 6 GRAMS OF CARBS   Add additional Novolog to this scheduled dose based on the premeal blood sugar. This is the correction factor.               140-180 add 1units              181-220             add 2 units              221-260 add 3units              261-320             add 4 units              321-380             add 5 units              380 or higher add 6 units    insulin lispro(+) (HUMALOG U-100 INSULIN) 100 unit/mL injection Maximum of 50 units daily. Via insulin pump. Use 1:6 insulin to carb ratio and the following correction factor Glucose 140-180 = 1 units Glucose 181-220 = 2 units Glucose 221-260 = 3 units Glucose 261-300 = 4 units Glucose 301-350 = 5 units Glucose 351-400 = 6 units Glucose >400 = 7 units    insulin pen needles (disposable) (BD UF NANO PEN NEEDLES) 32 gauge x 5/32 pen needle Use one each as directed as Needed. Use with insulin injections.    Insulin Syringe-Needle U-100 (BD INSULIN SYRINGE ULTRA-FINE) 1 mL 31 gauge x 5/16 syrg Use with insulin vials as directed    loperamide (IMODIUM A-D) 2 mg capsule Take 2 mg by mouth as Needed for Diarrhea. Take  2 capsules by mouth initially, followed by 1 capsule by mouth after each loose stool up to a maximum of 8 tablets in 24 hours.    Miscellaneous Medical Supply misc Straight catheter for use 4x daily    ondansetron (ZOFRAN) 4 mg tablet Take one tablet by mouth every 6 hours as needed.    pantoprazole DR (PROTONIX) 40 mg tablet Take one tablet by mouth daily.    pyridoxine (VITAMIN B-6) 25 mg tab Take one tablet by mouth daily.    rosuvastatin (CRESTOR) 20 mg tablet Take one tablet by mouth daily.    senna/docusate (SENOKOT-S) 8.6/50 mg tablet Take two tablets by mouth twice daily. Hold for loose stools    tamsulosin (FLOMAX) 0.4 mg capsule Take one capsule by mouth daily after breakfast. Do not crush, chew or open capsules. Take 30 minutes following the same meal each day.        ROS:  Review of Systems   Constitutional: Positive for fever. Negative for chills.   HENT: Negative.    Eyes: Negative.    Respiratory: Negative for shortness of breath.    Cardiovascular: Negative for chest pain.   Gastrointestinal: Positive for constipation. Negative for abdominal pain, diarrhea, nausea and vomiting.   Genitourinary: Negative.    Musculoskeletal: Negative.    Skin: Negative.    Neurological: Negative.    Endo/Heme/Allergies: Negative.    Psychiatric/Behavioral: Negative.      Vitals:     Physical Exam  Constitutional:       Appearance: Normal appearance.   HENT:      Head: Normocephalic.      Nose: Nose normal.      Mouth/Throat:      Mouth: Mucous membranes are moist.      Pharynx: Oropharynx is clear.   Eyes:      Extraocular Movements: Extraocular movements intact.   Cardiovascular:      Rate and Rhythm: Normal rate.      Pulses: Normal pulses.   Pulmonary:      Effort: Pulmonary effort is normal.   Abdominal:      General: There is no distension.      Palpations: Abdomen is soft.      Tenderness: There is no abdominal tenderness. There is no guarding or rebound.      Comments: Well healed port site incision scars   Musculoskeletal:         General: Normal range of motion.      Cervical back: Normal range of motion and neck supple.   Skin:     General: Skin is warm and dry.   Neurological:      General: No focal deficit present.      Mental Status: She is alert.   Psychiatric: Mood and Affect: Mood normal.       Lab/Radiology/Other Diagnostic Tests:  Lab Results   Component Value Date/Time    HGB 14.7 12/26/2020 1513    HCT 45.7 (H) 12/26/2020 1513    WBC 5.5 12/26/2020 1513    INR 0.9 12/26/2020 1513    PLTCT 202 12/26/2020 1513     Lab Results   Component Value Date/Time    NA 134 (L) 01/18/2020 1310    K 3.9 05/15/2020 0810    CL 101 01/18/2020 1310    CO2 23 01/18/2020 1310    BUN 22 01/18/2020 1310    CR 2.17 (H) 01/18/2020 1310     CHEST 2 VIEWS   Final Result  No acute cardiopulmonary abnormality.          Finalized by Golda Acre, M.D. on 05/07/2021 3:24 PM. Dictated by Golda Acre, M.D. on 05/07/2021 3:23 PM.             Lynnda Shields, DO  Team Pager: [phone]

## 2021-05-07 NOTE — Progress Notes
Patient BG 290. Per Dr. Elinor Parkinson, no new orders at this time.

## 2021-05-08 ENCOUNTER — Inpatient Hospital Stay: Admit: 2021-05-08 | Discharge: 2021-05-08 | Payer: MEDICARE

## 2021-05-08 ENCOUNTER — Encounter: Admit: 2021-05-08 | Discharge: 2021-05-08 | Payer: MEDICARE

## 2021-05-08 MED ADMIN — ANTI-THYMOCYTE GLOB (RABBIT) 25 MG IV SOLR [82321]: 526.400 mL | INTRAVENOUS | @ 20:00:00 | Stop: 2021-05-09 | NDC 58468008001

## 2021-05-08 MED ADMIN — SENNOSIDES-DOCUSATE SODIUM 8.6-50 MG PO TAB [40926]: 2 | ORAL | @ 17:00:00 | NDC 70000052601

## 2021-05-08 MED ADMIN — LABETALOL 5 MG/ML IV SYRG [86579]: 10 mg | INTRAVENOUS | @ 17:00:00 | NDC 00409233924

## 2021-05-08 MED ADMIN — CARVEDILOL 12.5 MG PO TAB [77424]: 12.5 mg | ORAL | @ 21:00:00 | NDC 00904630261

## 2021-05-08 MED ADMIN — LIDOCAINE 5 % TP PTMD [80759]: 1 | TOPICAL | @ 15:00:00 | NDC 00591352511

## 2021-05-08 MED ADMIN — INSULIN ASPART 100 UNIT/ML SC FLEXPEN [87504]: 20 [IU] | SUBCUTANEOUS | @ 06:00:00 | Stop: 2021-05-08 | NDC 00169633910

## 2021-05-08 MED ADMIN — HEPARIN (PORCINE) 1,000 UNIT/ML IJ SOLN [10176]: 526.400 mL | INTRAVENOUS | @ 20:00:00 | Stop: 2021-05-09 | NDC 71288040210

## 2021-05-08 MED ADMIN — SODIUM CHLORIDE 0.9 % IV SOLP [27838]: 526.400 mL | INTRAVENOUS | @ 20:00:00 | Stop: 2021-05-09 | NDC 00338004903

## 2021-05-08 MED ADMIN — DIPHENHYDRAMINE HCL 25 MG PO CAP [2509]: 25 mg | ORAL | @ 19:00:00 | Stop: 2021-05-08 | NDC 00904530661

## 2021-05-08 MED ADMIN — HYDROCORTISONE SOD SUCC (PF) 100 MG/2 ML IJ SOLR [302326]: 526.400 mL | INTRAVENOUS | @ 20:00:00 | Stop: 2021-05-09 | NDC 00009001103

## 2021-05-08 MED ADMIN — SCOPOLAMINE BASE 1 MG OVER 3 DAYS TD PT3D [82141]: 1 | TRANSDERMAL | @ 14:00:00 | NDC 10019055390

## 2021-05-08 MED ADMIN — ONDANSETRON HCL (PF) 4 MG/2 ML IJ SOLN [136012]: 4 mg | INTRAVENOUS | @ 14:00:00 | Stop: 2021-05-09 | NDC 36000001225

## 2021-05-08 MED ADMIN — ACETAMINOPHEN 325 MG PO TAB [101]: 650 mg | ORAL | @ 19:00:00 | Stop: 2021-05-08 | NDC 00904677361

## 2021-05-08 MED ADMIN — INSULIN REGULAR IN 0.9 % NACL 100 UNIT/100 ML (1 UNIT/ML) IV SOLN [451023]: 4 [IU]/h | INTRAVENOUS | @ 07:00:00 | Stop: 2021-05-08 | NDC 00338012612

## 2021-05-08 MED ADMIN — TACROLIMUS 1 MG PO CAP [82570]: 4 mg | ORAL | @ 23:00:00 | NDC 00469061773

## 2021-05-08 MED ADMIN — FUROSEMIDE 10 MG/ML IJ SOLN [3291]: 80 mg | INTRAVENOUS | @ 17:00:00 | Stop: 2021-05-08 | NDC 71288020304

## 2021-05-08 NOTE — Anesthesia Post-Procedure Evaluation
Post-Anesthesia Evaluation    Name: Brandy Joseph      MRN: [id number]     DOB: August 10, 1980     Age: 40 y.o.     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 WITHOUT RECIPIENT NEPHRECTOMY (N/A Abdomen)    Location: MAIN OR 12 / Main OR/Periop    Surgeons: Almon Register, MD          Post-Anesthesia Vitals  BP: 136/69 (11/02 2245)  Pulse: 85 (11/02 2245)  Respirations: 14 PER MINUTE (11/02 2245)  SpO2: 92 % (11/02 2245)  SpO2 Pulse: 83 (11/02 2215)  O2 Device: Nasal cannula (11/02 2245)   Vitals Value Taken Time   BP 136/69 05/07/21 2245   Temp 36.7 C (98 F) 05/07/21 2126   Pulse 85 05/07/21 2245   Respirations 14 PER MINUTE 05/07/21 2245   SpO2 92 % 05/07/21 2245   O2 Device Nasal cannula 05/07/21 2245   ABP     ART BP           Post Anesthesia Evaluation Note    Evaluation location: Pre/Post  Patient participation: recovered; patient participated in evaluation  Level of consciousness: alert    Pain score: 4  Pain management: adequate    Hydration: normovolemia  Temperature: 36.0C - 38.4C  Airway patency: adequate    Perioperative Events       Post-op nausea and vomiting: no PONV    Postoperative Status  Cardiovascular status: hemodynamically stable  Respiratory status: spontaneous ventilation  Follow-up needed: none        Perioperative Events  There were no known complications for this encounter.

## 2021-05-09 ENCOUNTER — Encounter: Admit: 2021-05-09 | Discharge: 2021-05-09 | Payer: MEDICARE

## 2021-05-09 ENCOUNTER — Inpatient Hospital Stay: Admit: 2021-05-09 | Discharge: 2021-05-09 | Payer: MEDICARE

## 2021-05-09 DIAGNOSIS — K3184 Gastroparesis: Secondary | ICD-10-CM

## 2021-05-09 DIAGNOSIS — N289 Disorder of kidney and ureter, unspecified: Secondary | ICD-10-CM

## 2021-05-09 DIAGNOSIS — D849 Immunodeficiency, unspecified: Secondary | ICD-10-CM

## 2021-05-09 DIAGNOSIS — R413 Other amnesia: Secondary | ICD-10-CM

## 2021-05-09 DIAGNOSIS — R75 Inconclusive laboratory evidence of human immunodeficiency virus [HIV]: Secondary | ICD-10-CM

## 2021-05-09 DIAGNOSIS — Z940 Kidney transplant status: Secondary | ICD-10-CM

## 2021-05-09 DIAGNOSIS — Z20828 Contact with and (suspected) exposure to other viral communicable diseases: Secondary | ICD-10-CM

## 2021-05-09 DIAGNOSIS — Z79899 Other long term (current) drug therapy: Secondary | ICD-10-CM

## 2021-05-09 DIAGNOSIS — Z9189 Other specified personal risk factors, not elsewhere classified: Secondary | ICD-10-CM

## 2021-05-09 DIAGNOSIS — I639 Cerebral infarction, unspecified: Secondary | ICD-10-CM

## 2021-05-09 DIAGNOSIS — I10 Essential (primary) hypertension: Secondary | ICD-10-CM

## 2021-05-09 DIAGNOSIS — E119 Type 2 diabetes mellitus without complications: Secondary | ICD-10-CM

## 2021-05-09 MED ADMIN — ANTI-THYMOCYTE GLOB (RABBIT) 25 MG IV SOLR [82321]: 531.400 mL | INTRAVENOUS | @ 18:00:00 | Stop: 2021-05-09 | NDC 58468008001

## 2021-05-09 MED ADMIN — ONDANSETRON HCL (PF) 4 MG/2 ML IJ SOLN [136012]: 8 mg | INTRAVENOUS | @ 11:00:00 | NDC 72266012301

## 2021-05-09 MED ADMIN — ACETAMINOPHEN 500 MG PO TAB [102]: 1000 mg | ORAL | @ 09:00:00 | NDC 00904673061

## 2021-05-09 MED ADMIN — LABETALOL 5 MG/ML IV SYRG [86579]: 10 mg | INTRAVENOUS | @ 05:00:00 | Stop: 2021-05-09 | NDC 00409233924

## 2021-05-09 MED ADMIN — HEPARIN (PORCINE) 1,000 UNIT/ML IJ SOLN [10176]: 531.400 mL | INTRAVENOUS | @ 18:00:00 | Stop: 2021-05-09 | NDC 71288040210

## 2021-05-09 MED ADMIN — CLONIDINE HCL 0.2 MG PO TAB [1756]: 0.2 mg | ORAL | @ 15:00:00 | NDC 00228212810

## 2021-05-09 MED ADMIN — ONDANSETRON HCL (PF) 4 MG/2 ML IJ SOLN [136012]: 8 mg | INTRAVENOUS | @ 11:00:00 | NDC 36000001225

## 2021-05-09 MED ADMIN — LABETALOL 5 MG/ML IV SYRG [86579]: 10 mg | INTRAVENOUS | @ 07:00:00 | NDC 00409233924

## 2021-05-09 MED ADMIN — SODIUM CHLORIDE 0.9 % IV SOLP [27838]: 250 mL | INTRAVENOUS | @ 04:00:00 | Stop: 2021-05-09 | NDC 00338004902

## 2021-05-09 MED ADMIN — ERGOCALCIFEROL (VITAMIN D2) 1,250 MCG (50,000 UNIT) PO CAP [81876]: 50000 [IU] | ORAL | @ 15:00:00 | NDC 60687050011

## 2021-05-09 MED ADMIN — HYDROCORTISONE SOD SUCC (PF) 100 MG/2 ML IJ SOLR [302326]: 531.400 mL | INTRAVENOUS | @ 18:00:00 | Stop: 2021-05-09 | NDC 00009001103

## 2021-05-09 MED ADMIN — POLYETHYLENE GLYCOL 3350 17 GRAM PO PWPK [25424]: 17 g | ORAL | @ 15:00:00 | NDC 00904693186

## 2021-05-09 MED ADMIN — CARVEDILOL 12.5 MG PO TAB [77424]: 12.5 mg | ORAL | @ 15:00:00 | NDC 00904630261

## 2021-05-09 MED ADMIN — BUPROPION XL 150 MG PO TB24 [88619]: 150 mg | ORAL | @ 20:00:00 | NDC 68180031906

## 2021-05-09 MED ADMIN — ONDANSETRON HCL (PF) 4 MG/2 ML IJ SOLN [136012]: 8 mg | INTRAVENOUS | @ 02:00:00 | Stop: 2021-05-09 | NDC 36000001225

## 2021-05-09 MED ADMIN — CEPHALEXIN 500 MG PO CAP [9500]: 500 mg | ORAL | @ 20:00:00 | NDC 60687016311

## 2021-05-09 MED ADMIN — SENNOSIDES-DOCUSATE SODIUM 8.6-50 MG PO TAB [40926]: 2 | ORAL | @ 15:00:00 | NDC 70000052601

## 2021-05-09 MED ADMIN — TACROLIMUS 1 MG PO CAP [82570]: 4 mg | ORAL | @ 23:00:00 | NDC 00469061773

## 2021-05-09 MED ADMIN — SENNOSIDES-DOCUSATE SODIUM 8.6-50 MG PO TAB [40926]: 2 | ORAL | @ 01:00:00 | NDC 70000052601

## 2021-05-09 MED ADMIN — SODIUM CHLORIDE 0.9 % IV SOLP [27838]: 531.400 mL | INTRAVENOUS | @ 18:00:00 | Stop: 2021-05-09 | NDC 00338004903

## 2021-05-09 MED ADMIN — PROCHLORPERAZINE EDISYLATE 5 MG/ML IJ SOLN [6580]: 10 mg | INTRAVENOUS | @ 03:00:00 | Stop: 2021-05-09 | NDC 23155029431

## 2021-05-09 MED ADMIN — CLONIDINE HCL 0.2 MG PO TAB [1756]: 0.2 mg | ORAL | @ 08:00:00 | NDC 00228212810

## 2021-05-09 MED ADMIN — TACROLIMUS 1 MG PO CAP [82570]: 4 mg | ORAL | @ 11:00:00 | NDC 00469061773

## 2021-05-09 MED ADMIN — BISACODYL 10 MG RE SUPP [1080]: 20 mg | RECTAL | @ 20:00:00 | NDC 00574705012

## 2021-05-09 MED ADMIN — LIDOCAINE 5 % TP PTMD [80759]: 1 | TOPICAL | @ 15:00:00 | NDC 00591352511

## 2021-05-09 MED ADMIN — DIPHENHYDRAMINE HCL 25 MG PO CAP [2509]: 25 mg | ORAL | @ 17:00:00 | Stop: 2021-05-09 | NDC 00904530661

## 2021-05-09 MED ADMIN — ACETAMINOPHEN 500 MG PO TAB [102]: 1000 mg | ORAL | @ 15:00:00 | NDC 00904673061

## 2021-05-09 MED ADMIN — ACETAMINOPHEN 500 MG PO TAB [102]: 1000 mg | ORAL | @ 01:00:00 | NDC 00904673061

## 2021-05-09 MED ADMIN — ACETAMINOPHEN 500 MG PO TAB [102]: 1000 mg | ORAL | NDC 00904673061

## 2021-05-09 NOTE — Telephone Encounter
Please schedule patient for ureteral stent removal on 06/04/21 or any day thereafter. Thank you!

## 2021-05-10 ENCOUNTER — Encounter: Admit: 2021-05-10 | Discharge: 2021-05-10 | Payer: MEDICARE

## 2021-05-10 MED ADMIN — CARVEDILOL 12.5 MG PO TAB [77424]: 12.5 mg | ORAL | @ 15:00:00 | NDC 00904630261

## 2021-05-10 MED ADMIN — CLONIDINE HCL 0.2 MG PO TAB [1756]: 0.2 mg | ORAL | @ 02:00:00 | NDC 00228212810

## 2021-05-10 MED ADMIN — TACROLIMUS 1 MG PO CAP [82570]: 6 mg | ORAL | NDC 00469061773

## 2021-05-10 MED ADMIN — TRAMADOL 50 MG PO TAB [14632]: 50 mg | ORAL | @ 16:00:00 | NDC 57664037708

## 2021-05-10 MED ADMIN — CEPHALEXIN 500 MG PO CAP [9500]: 500 mg | ORAL | @ 02:00:00 | NDC 60687016311

## 2021-05-10 MED ADMIN — BISACODYL 10 MG RE SUPP [1080]: 20 mg | RECTAL | @ 18:00:00 | NDC 00574705012

## 2021-05-10 MED ADMIN — ALBUTEROL SULFATE 2.5 MG/0.5 ML IN NEBU [93139]: 2.5 mg | RESPIRATORY_TRACT | @ 22:00:00 | Stop: 2021-05-10 | NDC 00487990130

## 2021-05-10 MED ADMIN — POLYETHYLENE GLYCOL 3350 17 GRAM PO PWPK [25424]: 17 g | ORAL | @ 15:00:00 | NDC 00904693186

## 2021-05-10 MED ADMIN — CEPHALEXIN 500 MG PO CAP [9500]: 500 mg | ORAL | @ 15:00:00 | NDC 60687016311

## 2021-05-10 MED ADMIN — CLONIDINE HCL 0.2 MG PO TAB [1756]: 0.2 mg | ORAL | @ 15:00:00 | NDC 00228212810

## 2021-05-10 MED ADMIN — SENNOSIDES-DOCUSATE SODIUM 8.6-50 MG PO TAB [40926]: 2 | ORAL | @ 15:00:00 | NDC 70000052601

## 2021-05-10 MED ADMIN — TACROLIMUS 5 MG PO CAP [78307]: 6 mg | ORAL | NDC 00469065773

## 2021-05-10 MED ADMIN — TACROLIMUS 1 MG PO CAP [82570]: 4 mg | ORAL | @ 10:00:00 | Stop: 2021-05-10 | NDC 00469061773

## 2021-05-10 MED ADMIN — SENNOSIDES-DOCUSATE SODIUM 8.6-50 MG PO TAB [40926]: 2 | ORAL | @ 02:00:00 | NDC 70000052601

## 2021-05-10 MED ADMIN — CARVEDILOL 12.5 MG PO TAB [77424]: 12.5 mg | ORAL | @ 02:00:00 | NDC 00904630261

## 2021-05-10 MED ADMIN — FLUCONAZOLE 100 MG PO TAB [10044]: 150 mg | ORAL | @ 18:00:00 | Stop: 2021-05-10 | NDC 68462010230

## 2021-05-10 MED ADMIN — ONDANSETRON HCL (PF) 4 MG/2 ML IJ SOLN [136012]: 8 mg | INTRAVENOUS | @ 18:00:00 | NDC 36000001225

## 2021-05-10 MED ADMIN — PENTAMIDINE 300 MG INH SOLR [79725]: 300 mg | RESPIRATORY_TRACT | @ 22:00:00 | Stop: 2021-05-10 | NDC 13925052201

## 2021-05-10 MED ADMIN — BUPROPION XL 150 MG PO TB24 [88619]: 150 mg | ORAL | @ 15:00:00 | NDC 68180031906

## 2021-05-10 MED ADMIN — LIDOCAINE 5 % TP PTMD [80759]: 1 | TOPICAL | @ 15:00:00 | NDC 00591352511

## 2021-05-10 MED ADMIN — ACETAMINOPHEN 500 MG PO TAB [102]: 1000 mg | ORAL | @ 19:00:00 | NDC 00904673061

## 2021-05-10 MED ADMIN — ONDANSETRON HCL (PF) 4 MG/2 ML IJ SOLN [136012]: 8 mg | INTRAVENOUS | @ 10:00:00 | NDC 36000001225

## 2021-05-11 ENCOUNTER — Encounter: Admit: 2021-05-11 | Discharge: 2021-05-11 | Payer: MEDICARE

## 2021-05-11 MED ADMIN — CARVEDILOL 12.5 MG PO TAB [77424]: 12.5 mg | ORAL | @ 02:00:00 | NDC 00904630261

## 2021-05-11 MED ADMIN — SENNOSIDES-DOCUSATE SODIUM 8.6-50 MG PO TAB [40926]: 2 | ORAL | @ 02:00:00 | NDC 70000052601

## 2021-05-11 MED ADMIN — CLONIDINE HCL 0.2 MG PO TAB [1756]: 0.2 mg | ORAL | @ 16:00:00 | Stop: 2021-05-11 | NDC 00228212810

## 2021-05-11 MED ADMIN — TACROLIMUS 1 MG PO CAP [82570]: 6 mg | ORAL | @ 11:00:00 | Stop: 2021-05-11 | NDC 00469061773

## 2021-05-11 MED ADMIN — ACETAMINOPHEN 500 MG PO TAB [102]: 1000 mg | ORAL | @ 10:00:00 | Stop: 2021-05-11 | NDC 00904673061

## 2021-05-11 MED ADMIN — CEPHALEXIN 500 MG PO CAP [9500]: 500 mg | ORAL | @ 02:00:00 | NDC 60687016311

## 2021-05-11 MED ADMIN — TRAMADOL 50 MG PO TAB [14632]: 50 mg | ORAL | @ 02:00:00 | NDC 57664037708

## 2021-05-11 MED ADMIN — TRAMADOL 50 MG PO TAB [14632]: 50 mg | ORAL | @ 20:00:00 | Stop: 2021-05-11 | NDC 57664037708

## 2021-05-11 MED ADMIN — SCOPOLAMINE BASE 1 MG OVER 3 DAYS TD PT3D [82141]: 1 | TRANSDERMAL | @ 16:00:00 | Stop: 2021-05-11 | NDC 10019055390

## 2021-05-11 MED ADMIN — TRAMADOL 50 MG PO TAB [14632]: 50 mg | ORAL | @ 10:00:00 | Stop: 2021-05-11 | NDC 57664037708

## 2021-05-11 MED ADMIN — BUPROPION XL 150 MG PO TB24 [88619]: 150 mg | ORAL | @ 16:00:00 | Stop: 2021-05-11 | NDC 68180031906

## 2021-05-11 MED ADMIN — ONDANSETRON HCL (PF) 4 MG/2 ML IJ SOLN [136012]: 8 mg | INTRAVENOUS | @ 02:00:00 | NDC 72266012301

## 2021-05-11 MED ADMIN — PANTOPRAZOLE 40 MG PO TBEC [80436]: 40 mg | ORAL | @ 02:00:00 | NDC 00904647461

## 2021-05-11 MED ADMIN — CARVEDILOL 12.5 MG PO TAB [77424]: 12.5 mg | ORAL | @ 16:00:00 | Stop: 2021-05-11 | NDC 00904630261

## 2021-05-11 MED ADMIN — CLONIDINE HCL 0.2 MG PO TAB [1756]: 0.2 mg | ORAL | @ 02:00:00 | NDC 00228212810

## 2021-05-11 MED ADMIN — TACROLIMUS 5 MG PO CAP [78307]: 6 mg | ORAL | @ 11:00:00 | Stop: 2021-05-11 | NDC 00469065773

## 2021-05-11 MED ADMIN — CEPHALEXIN 500 MG PO CAP [9500]: 500 mg | ORAL | @ 16:00:00 | Stop: 2021-05-11 | NDC 60687016311

## 2021-05-11 MED ADMIN — ONDANSETRON HCL (PF) 4 MG/2 ML IJ SOLN [136012]: 8 mg | INTRAVENOUS | @ 10:00:00 | Stop: 2021-05-11 | NDC 36000001225

## 2021-05-11 MED ADMIN — ACETAMINOPHEN 500 MG PO TAB [102]: 1000 mg | ORAL | @ 04:00:00 | NDC 00904673061

## 2021-05-11 MED FILL — VALGANCICLOVIR 450 MG PO TAB: 450 mg | ORAL | 30 days supply | Qty: 60 | Fill #1 | Status: CP

## 2021-05-11 MED FILL — PREDNISONE 5 MG PO TAB: 5 mg | 42 days supply | Qty: 120 | Fill #1 | Status: CP

## 2021-05-11 MED FILL — ONDANSETRON 4 MG PO TBDI: 4 mg | ORAL | 5 days supply | Qty: 15 | Fill #1 | Status: CP

## 2021-05-11 MED FILL — CEPHALEXIN 500 MG PO CAP: 500 mg | ORAL | 7 days supply | Qty: 14 | Fill #1 | Status: CP

## 2021-05-11 MED FILL — NYSTATIN 100,000 UNIT/ML PO SUSP: 100000 units/mL | ORAL | 26 days supply | Qty: 520 | Fill #1 | Status: CP

## 2021-05-11 MED FILL — ALBUTEROL SULFATE 90 MCG/ACTUATION IN HFAA: 90 mcg/actuation | 30 days supply | Qty: 6.7 | Fill #1 | Status: CP

## 2021-05-11 MED FILL — MYCOPHENOLATE SODIUM 180 MG PO TBEC: 180 mg | ORAL | 30 days supply | Qty: 240 | Fill #1 | Status: CP

## 2021-05-11 MED FILL — TACROLIMUS 1 MG PO CAP: 1 mg | ORAL | 30 days supply | Qty: 480 | Fill #1 | Status: CP

## 2021-05-11 MED FILL — ACETAMINOPHEN 325 MG PO TAB: 325 mg | ORAL | 9 days supply | Qty: 100 | Fill #1 | Status: CP

## 2021-05-11 MED FILL — TRAMADOL 50 MG PO TAB: 50 mg | ORAL | 5 days supply | Qty: 20 | Fill #1 | Status: CP

## 2021-05-11 MED FILL — DOCUSATE SODIUM 100 MG PO CAP: 100 mg | ORAL | 30 days supply | Qty: 60 | Fill #1 | Status: CP

## 2021-05-11 MED FILL — ERGOCALCIFEROL (VITAMIN D2) 1,250 MCG (50,000 UNIT) PO CAP: 1250 mcg (50,000 unit) | ORAL | 28 days supply | Qty: 4 | Fill #1 | Status: CP

## 2021-05-11 MED FILL — POLYETHYLENE GLYCOL 3350 17 GRAM/DOSE PO POWD: 17 gram/dose | ORAL | 30 days supply | Qty: 510 | Fill #1 | Status: CP

## 2021-05-12 ENCOUNTER — Encounter: Admit: 2021-05-12 | Discharge: 2021-05-12 | Payer: MEDICARE

## 2021-05-12 NOTE — Telephone Encounter
Called patient to follow up post discharge after Kidney transplant. Asked about patient's pain. Pain is controlled with current analgesics.  Medication(s) being used:   narcotic analgesics including  oxycodone (Oxycontin, Oxyir).   RN asked patient about their bowel movements, patient replied that they are having normal bowel movements. Advised patient to continue with current bowel regimen.   Asked patient about anti-rejection medications, specifically if they were able to take them without issues or had any questions. Asked patient if they knew doses of anti-rejection medications, if not, do they know where to find their dose? Patient replied Yes. Asked patient if they knew what tarcrolimus looked like? Patient replied  Yes. Pt's aunt and s/o, Brandy Joseph, manage her medications d/t memory loss from prior CVA.   [facility]. Reminded patient to bring binder and medications with them to appointment and to NOT take Prograf before labs are drawn.  Reminded patient to answer his/her phone because nurses will be calling to adjust prograf levels.   Asked patient if he/she is documenting urine. Patient replied Yes  Asked patient if they had any questions, patient replied No.

## 2021-05-13 ENCOUNTER — Encounter: Admit: 2021-05-13 | Discharge: 2021-05-13 | Payer: MEDICARE

## 2021-05-13 MED ORDER — CARVEDILOL 25 MG PO TAB
25 mg | ORAL_TABLET | Freq: Two times a day (BID) | ORAL | 3 refills | Status: CN
Start: 2021-05-13 — End: ?

## 2021-05-13 MED ORDER — PROMETHAZINE 12.5 MG PO TAB
12.5 mg | ORAL_TABLET | ORAL | 0 refills | 7.00000 days | Status: AC | PRN
Start: 2021-05-13 — End: ?
  Filled 2021-05-14: qty 30, 10d supply, fill #1

## 2021-05-13 NOTE — Telephone Encounter
Pt's Aunt called  And said that  she has concerns because the Pt is in pain and she would like to speak with you just to make sure that they are on the right track. Please return her call because she has a couple of questions also.

## 2021-05-13 NOTE — Telephone Encounter
Pain 9/10 in lower back worse with standing.     Patient last took tylenol/tramadol at 1200.    BS is running in the low 200s      Monday am sitting 177/85                              182/87      Received the following orders from Dr. Lyndel Safe:    Increase coreg to 25 mg BID  Phenergan 12.5mg  q 8 hrs prn nausea  Aggressive stool regime for constipation   Alternate tramadol and tylenol for better pain control, heating pad, ice as needed.   Titrate insulin pump for desired blood glucose control.     Patient and Felipa Eth verbalized all instructions. Medication list updated. Patient aware of how to page on call coordinator overnight if necessary. Clinic visit scheduled tomorrow.

## 2021-05-14 ENCOUNTER — Encounter: Admit: 2021-05-14 | Discharge: 2021-05-14 | Payer: MEDICARE

## 2021-05-14 ENCOUNTER — Ambulatory Visit: Admit: 2021-05-14 | Discharge: 2021-05-14 | Payer: MEDICARE

## 2021-05-14 DIAGNOSIS — K3184 Gastroparesis: Secondary | ICD-10-CM

## 2021-05-14 DIAGNOSIS — Z940 Kidney transplant status: Secondary | ICD-10-CM

## 2021-05-14 DIAGNOSIS — Z79899 Other long term (current) drug therapy: Secondary | ICD-10-CM

## 2021-05-14 DIAGNOSIS — R413 Other amnesia: Secondary | ICD-10-CM

## 2021-05-14 DIAGNOSIS — N289 Disorder of kidney and ureter, unspecified: Secondary | ICD-10-CM

## 2021-05-14 DIAGNOSIS — D849 Immunodeficiency, unspecified: Secondary | ICD-10-CM

## 2021-05-14 DIAGNOSIS — I10 Essential (primary) hypertension: Secondary | ICD-10-CM

## 2021-05-14 DIAGNOSIS — E119 Type 2 diabetes mellitus without complications: Secondary | ICD-10-CM

## 2021-05-14 DIAGNOSIS — I639 Cerebral infarction, unspecified: Secondary | ICD-10-CM

## 2021-05-14 LAB — URINALYSIS MICROSCOPIC REFLEX TO CULTURE

## 2021-05-14 LAB — CBC AND DIFF
RBC COUNT: 3.3 M/UL — ABNORMAL LOW (ref 4.0–5.0)
WBC COUNT: 7.9 K/UL (ref 4.5–11.0)

## 2021-05-14 LAB — COMPREHENSIVE METABOLIC PANEL
ALBUMIN: 4.2 g/dL — ABNORMAL LOW (ref 3.5–5.0)
GLUCOSE,PANEL: 201 mg/dL — ABNORMAL HIGH (ref 70–100)
POTASSIUM: 3.7 MMOL/L — ABNORMAL LOW (ref 3.5–5.1)
SODIUM: 138 MMOL/L — ABNORMAL LOW (ref 137–147)
TOTAL PROTEIN: 6.6 g/dL (ref 6.0–8.0)

## 2021-05-14 LAB — MAGNESIUM: MAGNESIUM: 1.4 mg/dL — ABNORMAL LOW (ref <0.15)

## 2021-05-14 LAB — URINALYSIS DIPSTICK REFLEX TO CULTURE
LEUKOCYTES: NEGATIVE
NITRITE: NEGATIVE
URINE ASCORBIC ACID, UA: NEGATIVE

## 2021-05-14 LAB — PROTEIN/CR RATIO,UR RAN
UR CREATININE, RAN: 49 mg/dL — ABNORMAL HIGH (ref 0.4–1.00)
UR TOTAL PROTEIN,RAN: 53 mg/dL — ABNORMAL HIGH (ref 7–25)

## 2021-05-14 LAB — PHOSPHORUS: PHOSPHORUS: 2.2 mg/dL (ref 2.0–4.5)

## 2021-05-14 LAB — URIC ACID: URIC ACID: 7.5 mg/dL — ABNORMAL HIGH (ref 2.0–7.0)

## 2021-05-14 LAB — TACROLIMUS LC-MS/MS: TACROLIMUS LC-MS/MS: 11 — AB

## 2021-05-14 MED ORDER — FUROSEMIDE 40 MG PO TAB
40 mg | ORAL_TABLET | Freq: Every morning | ORAL | 0 refills | 90.00000 days | Status: DC
Start: 2021-05-14 — End: 2021-05-14
  Filled 2021-05-14: qty 3, 3d supply, fill #1

## 2021-05-14 MED ORDER — LIDOCAINE 5 % TP PTMD
1 | MEDICATED_PATCH | Freq: Every day | TOPICAL | 0 refills | Status: CN
Start: 2021-05-14 — End: ?

## 2021-05-14 MED ORDER — FUROSEMIDE 40 MG PO TAB
40 mg | ORAL_TABLET | Freq: Every morning | ORAL | 0 refills | Status: CN
Start: 2021-05-14 — End: ?

## 2021-05-14 MED FILL — CEPHALEXIN 500 MG PO CAP: 500 mg | ORAL | 23 days supply | Qty: 46 | Fill #2 | Status: CP

## 2021-05-14 NOTE — Telephone Encounter
Received an incoming call from:    [place] Name: Brandy Joseph    Relationship to Patient: n/a    Callback Number: Fax: [phone]    Purpose of Call: Nira Conn called in regards to pt follow up appt following her surgery tomorrow (11/10) at 1120am.They would like for you to fax over something pertinent to the transplant(surgical report). No call back was provided.    Please fax [phone]

## 2021-05-15 ENCOUNTER — Encounter: Admit: 2021-05-15 | Discharge: 2021-05-15 | Payer: MEDICARE

## 2021-05-15 NOTE — Progress Notes
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    Allergies  Allergies   Allergen Reactions   ? Cephalexin RASH   ? Ketorolac RASH   ? Sulfa (Sulfonamide Antibiotics) EDEMA     Full body swelling; eye swelling; itchiness       Pertinent Labs  CBC w diff  CBC with Diff Latest Ref Rng & Units 05/14/2021 05/11/2021 05/10/2021 05/09/2021 05/08/2021   WBC 4.5 - 11.0 K/UL 7.9 6.9 6.1 12.7(H) 12.4(H)   RBC 4.0 - 5.0 M/UL 3.36(L) 3.37(L) 3.20(L) 3.33(L) 3.27(L)   HGB 12.0 - 15.0 GM/DL 10.7(L) 10.4(L) 10.0(L) 10.5(L) 10.4(L)   HCT 36 - 45 % 31.3(L) 31.7(L) 30.0(L) 31.3(L) 30.2(L)   MCV 80 - 100 FL 93.0 94.0 93.7 94.0 92.5   MCH 26 - 34 PG 31.9 31.0 31.2 31.7 31.9   MCHC 32.0 - 36.0 G/DL 96.2 95.2 84.1 32.4 40.1   RDW 11 - 15 % 12.5 13.0 12.8 12.9 12.7   PLT 150 - 400 K/UL 226 145(L) 159 180 171   MPV 7 - 11 FL 8.3 8.5 8.6 8.5 8.7   NEUT 41 - 77 % 80(H) 90(H) 88(H) 92(H) 97(H)   ANC 1.8 - 7.0 K/UL 6.28 6.21 5.38 11.74(H) 11.98(H)   LYMA 24 - 44 % 12(L) 5(L) 4(L) 2(L) 1(L)   ALYM 1.0 - 4.8 K/UL 0.95(L) 0.36(L) 0.24(L) 0.24(L) 0.14(L)   MONA 4 - 12 % 7 5 8 6  2(L)   AMONO 0 - 0.80 K/UL 0.57 0.37 0.50 0.72 0.28   EOSA 0 - 5 % 0 0 0 0 0   AEOS 0 - 0.45 K/UL 0.03 0.00 0.00 0.00 0.00   BASA 0 - 2 % 1 0 0 0 0   ABAS 0 - 0.20 K/UL 0.04 0.00 0.00 0.02 0.02       Comprehensive Metabolic Profile  CMP Latest Ref Rng & Units 05/14/2021 05/11/2021 05/10/2021 05/09/2021 05/09/2021   NA 137 - 147 MMOL/L 138 140 138 136(L) 135(L)   K 3.5 - 5.1 MMOL/L 3.7 4.5 4.4 4.3 4.3   CL 98 - 110 MMOL/L 103 106 104 102 101   CO2 21 - 30 MMOL/L 26 23 23 22  20(L)   GAP 3 - 12 9 11 11 12  14(H)   BUN 7 - 25 MG/DL 02(V) 25(D) 66(Y) 40(H) 52(H)   CR 0.4 - 1.00 MG/DL 4.74(Q) 5.95(G) 3.87(F) 3.39(H) 3.57(H)   GLUX 70 - 100 MG/DL 643(P) 295(J) 884(Z) 660(Y) 307(H)   CA 8.5 - 10.6 MG/DL 9.4 8.9 8.8 8.7 8.8   TP 6.0 - 8.0 G/DL 6.6 6.0 3.0(Z) - 5.8(L)   ALB 3.5 - 5.0 G/DL 4.2 6.0(F) 3.2(L) - 3.3(L)   ALKP 25 - 110 U/L 91 86 86 - 108   ALT 7 - 56 U/L 53 104(H) 149(H) - 250(H)   TBILI 0.3 - 1.2 MG/DL 0.6 0.4 0.3 - 0.3   GFR >60 mL/min - - - - -   GFRAA >60 mL/min - - - - -       LOG10  CMV   Date Value Ref Range Status   05/14/2021 <1.7  Final     CMV DNA Quant PCR   Date Value Ref Range Status   05/14/2021 CMV DNA NOT DETECTED CMVND-CMV DNA NOT DETECTED [IU]/mL Final       Weight:  Current Actual Body Weight in Kilograms: 92.5  Ideal Body Weight in Kilograms: 59.25  Adjusted Body Weight in Kilograms: 72.57    Renal Function:  Calculated creatinine clearance based on Cockcroft-Gault Equation based on IBW: 42.9 ml/min  Calculated creatinine clearance based on Cockcroft-Gault Equation based on Adjusted Body Weight: 52.6 ml/min  Calculated creatinine clearance based on Cockcroft-Gault Equation based on Actual Body Weight: 67.2 ml/min    eGFR based on MDRD: 35.52      Assessment/Plan    Infection    CMV  Status: D+/R+ (verified in [facility])  Regimen: valganciclovir 450mg  three times weekly for 3 months until 08/07/2021 for prophylaxis Follow-up labs due according to standard follow-up protocol.    Monitoring: Renal function is improved. Thrombocytopenia is not present. Leukopenia is not present. CMV viremia is not present.  Dose evaluation: The current dose is not appropriate. A dose adjustment is needed due to  renal function. The plan is to increase dose and contact provider. Increase to Valcyte 900mg  daily Notified transplant provider and transplant coordinator. Transplant coordinator will communicate any dose changes to patient. Follow up with next laboratory check per post-transplant protocol.      Education   Education was not provided.        Lonzo Cloud, PHARMD

## 2021-05-16 ENCOUNTER — Encounter: Admit: 2021-05-16 | Discharge: 2021-05-16 | Payer: MEDICARE

## 2021-05-16 ENCOUNTER — Inpatient Hospital Stay: Admit: 2021-05-16 | Payer: MEDICARE

## 2021-05-16 ENCOUNTER — Emergency Department: Admit: 2021-05-16 | Discharge: 2021-05-16 | Payer: MEDICARE

## 2021-05-16 DIAGNOSIS — R1013 Epigastric pain: Secondary | ICD-10-CM

## 2021-05-16 DIAGNOSIS — K5900 Constipation, unspecified: Secondary | ICD-10-CM

## 2021-05-16 LAB — URINALYSIS MICROSCOPIC REFLEX TO CULTURE

## 2021-05-16 LAB — COMPREHENSIVE METABOLIC PANEL
BLD UREA NITROGEN: 27 mg/dL — ABNORMAL HIGH (ref 7–25)
CHLORIDE: 99 MMOL/L — ABNORMAL LOW (ref 98–110)
GLUCOSE,PANEL: 302 mg/dL — ABNORMAL HIGH (ref 70–100)
POTASSIUM: 4.1 MMOL/L — ABNORMAL LOW (ref 3.5–5.1)
SODIUM: 133 MMOL/L — ABNORMAL LOW (ref 137–147)

## 2021-05-16 LAB — URINALYSIS DIPSTICK REFLEX TO CULTURE
NITRITE: NEGATIVE K/UL (ref 0–0.45)
URINE ASCORBIC ACID, UA: NEGATIVE
URINE BILE: NEGATIVE K/UL — ABNORMAL HIGH (ref 3–12)

## 2021-05-16 LAB — CBC AND DIFF: WBC COUNT: 9 K/UL (ref 4.5–11.0)

## 2021-05-16 LAB — POC TROPONIN: TROPONIN I, POC: 0 ng/mL (ref 0.00–0.05)

## 2021-05-16 LAB — POC LACTATE: LACTIC ACID POC: 0.8 MMOL/L (ref 0.5–2.0)

## 2021-05-16 LAB — MAGNESIUM: MAGNESIUM: 1.2 mg/dL — ABNORMAL LOW (ref 1.6–2.6)

## 2021-05-16 LAB — BETA-HCG: BETA-HCG: <1 U/L (ref <5)

## 2021-05-16 LAB — LIPASE: LIPASE: 5 U/L — ABNORMAL LOW (ref 11–82)

## 2021-05-16 MED ORDER — ONDANSETRON 4 MG PO TBDI
4 mg | ORAL | 0 refills | Status: AC | PRN
Start: 2021-05-16 — End: ?

## 2021-05-16 MED ORDER — MYCOPHENOLATE SODIUM 360 MG PO TBEC
720 mg | Freq: Two times a day (BID) | ORAL | 0 refills | Status: AC
Start: 2021-05-16 — End: ?
  Administered 2021-05-17 – 2021-05-19 (×6): 720 mg via ORAL

## 2021-05-16 MED ORDER — VALGANCICLOVIR 450 MG PO TAB
900 mg | ORAL_TABLET | Freq: Every day | ORAL | 2 refills | Status: CN
Start: 2021-05-16 — End: ?

## 2021-05-16 MED ORDER — PATCH DOCUMENTATION - SCOPOLAMINE BASE 1 MG/72HR
1 | Freq: Two times a day (BID) | TRANSDERMAL | 0 refills | Status: AC
Start: 2021-05-16 — End: ?

## 2021-05-16 MED ORDER — PREDNISONE 20 MG PO TAB
20 mg | Freq: Once | ORAL | 0 refills | Status: CP
Start: 2021-05-16 — End: ?
  Administered 2021-05-16: 18:00:00 20 mg via ORAL

## 2021-05-16 MED ORDER — TAMSULOSIN 0.4 MG PO CAP
.4 mg | Freq: Every day | ORAL | 0 refills | Status: AC
Start: 2021-05-16 — End: ?
  Administered 2021-05-17 – 2021-05-19 (×3): 0.4 mg via ORAL

## 2021-05-16 MED ORDER — TRAMADOL 50 MG PO TAB
50 mg | ORAL | 0 refills | Status: AC | PRN
Start: 2021-05-16 — End: ?
  Administered 2021-05-17 – 2021-05-18 (×3): 50 mg via ORAL

## 2021-05-16 MED ORDER — MAGNESIUM HYDROXIDE 2,400 MG/10 ML PO SUSP
10 mL | Freq: Every day | ORAL | 0 refills | Status: AC | PRN
Start: 2021-05-16 — End: ?

## 2021-05-16 MED ORDER — LACTATED RINGERS IV SOLP
INTRAVENOUS | 0 refills | Status: AC
Start: 2021-05-16 — End: ?
  Administered 2021-05-17: 03:00:00 1000.000 mL via INTRAVENOUS

## 2021-05-16 MED ORDER — PROMETHAZINE 25 MG PO TAB
12.5 mg | Freq: Once | ORAL | 0 refills | Status: CP
Start: 2021-05-16 — End: ?
  Administered 2021-05-16: 18:00:00 12.5 mg via ORAL

## 2021-05-16 MED ORDER — CLONIDINE HCL 0.2 MG PO TAB
.2 mg | Freq: Two times a day (BID) | ORAL | 0 refills | Status: AC
Start: 2021-05-16 — End: ?
  Administered 2021-05-17 – 2021-05-19 (×6): 0.2 mg via ORAL

## 2021-05-16 MED ORDER — ALBUTEROL SULFATE 90 MCG/ACTUATION IN HFAA
1-2 | RESPIRATORY_TRACT | 0 refills | Status: DC | PRN
Start: 2021-05-16 — End: 2021-05-17

## 2021-05-16 MED ORDER — SCOPOLAMINE BASE 1 MG OVER 3 DAYS TD PT3D
1 | TRANSDERMAL | 0 refills | Status: AC
Start: 2021-05-16 — End: ?
  Administered 2021-05-17: 06:00:00 1 via TRANSDERMAL

## 2021-05-16 MED ORDER — HYDROMORPHONE (PF) 2 MG/ML IJ SYRG
1 mg | Freq: Once | INTRAVENOUS | 0 refills | Status: CP
Start: 2021-05-16 — End: ?
  Administered 2021-05-16: 21:00:00 1 mg via INTRAVENOUS

## 2021-05-16 MED ORDER — POLYETHYLENE GLYCOL 3350 17 GRAM PO PWPK
2 | Freq: Two times a day (BID) | ORAL | 0 refills | Status: AC
Start: 2021-05-16 — End: ?
  Administered 2021-05-17 – 2021-05-18 (×2): 34 g via ORAL
  Administered 2021-05-19: 15:00:00 17 g via ORAL

## 2021-05-16 MED ORDER — BISACODYL 10 MG RE SUPP
10 mg | Freq: Two times a day (BID) | RECTAL | 0 refills | Status: AC
Start: 2021-05-16 — End: ?
  Administered 2021-05-19: 15:00:00 10 mg via RECTAL

## 2021-05-16 MED ORDER — BUPROPION XL 150 MG PO TB24
150 mg | Freq: Every morning | ORAL | 0 refills | Status: AC
Start: 2021-05-16 — End: ?
  Administered 2021-05-17 – 2021-05-19 (×3): 150 mg via ORAL

## 2021-05-16 MED ORDER — ROSUVASTATIN 20 MG PO TAB
10 mg | Freq: Every day | ORAL | 0 refills | Status: AC
Start: 2021-05-16 — End: ?
  Administered 2021-05-17 – 2021-05-19 (×3): 10 mg via ORAL

## 2021-05-16 MED ORDER — ACETAMINOPHEN 500 MG PO TAB
1000 mg | ORAL | 0 refills | Status: AC
Start: 2021-05-16 — End: ?
  Administered 2021-05-17 – 2021-05-19 (×11): 1000 mg via ORAL

## 2021-05-16 MED ORDER — CARVEDILOL 25 MG PO TAB
25 mg | Freq: Two times a day (BID) | ORAL | 0 refills | Status: AC
Start: 2021-05-16 — End: ?
  Administered 2021-05-17 – 2021-05-19 (×6): 25 mg via ORAL

## 2021-05-16 MED ORDER — LABETALOL 5 MG/ML IV SYRG
10 mg | INTRAVENOUS | 0 refills | Status: AC | PRN
Start: 2021-05-16 — End: ?
  Administered 2021-05-17: 09:00:00 10 mg via INTRAVENOUS

## 2021-05-16 MED ORDER — ONDANSETRON HCL (PF) 4 MG/2 ML IJ SOLN
4 mg | INTRAVENOUS | 0 refills | Status: AC | PRN
Start: 2021-05-16 — End: ?
  Administered 2021-05-17 (×2): 4 mg via INTRAVENOUS

## 2021-05-16 MED ORDER — SENNOSIDES-DOCUSATE SODIUM 8.6-50 MG PO TAB
2 | Freq: Two times a day (BID) | ORAL | 0 refills | Status: AC
Start: 2021-05-16 — End: ?
  Administered 2021-05-17 – 2021-05-19 (×4): 2 via ORAL

## 2021-05-16 MED ORDER — LIDOCAINE 5 % TP PTMD
1 | Freq: Every day | TOPICAL | 0 refills | Status: AC
Start: 2021-05-16 — End: ?
  Administered 2021-05-17 – 2021-05-18 (×3): 1 via TOPICAL

## 2021-05-16 MED ORDER — NYSTATIN 100,000 UNIT/ML PO SUSP
500000 [IU] | Freq: Four times a day (QID) | ORAL | 0 refills | Status: AC
Start: 2021-05-16 — End: ?
  Administered 2021-05-17 – 2021-05-19 (×10): 500000 [IU] via ORAL

## 2021-05-16 MED ORDER — MELATONIN 5 MG PO TAB
5 mg | Freq: Every evening | ORAL | 0 refills | Status: AC
Start: 2021-05-16 — End: ?
  Administered 2021-05-17: 03:00:00 5 mg via ORAL

## 2021-05-16 MED ORDER — FENTANYL CITRATE (PF) 50 MCG/ML IJ SOLN
25-50 ug | INTRAVENOUS | 0 refills | Status: AC | PRN
Start: 2021-05-16 — End: ?
  Administered 2021-05-17: 06:00:00 25 ug via INTRAVENOUS
  Administered 2021-05-17: 07:00:00 50 ug via INTRAVENOUS

## 2021-05-16 MED ORDER — VALGANCICLOVIR 450 MG PO TAB
900 mg | Freq: Every day | ORAL | 0 refills | Status: AC
Start: 2021-05-16 — End: ?
  Administered 2021-05-17: 15:00:00 900 mg via ORAL

## 2021-05-16 MED ORDER — FENTANYL CITRATE (PF) 50 MCG/ML IJ SOLN
75 ug | INTRAVENOUS | 0 refills | Status: DC | PRN
Start: 2021-05-16 — End: 2021-05-17
  Administered 2021-05-16: 17:00:00 75 ug via INTRAVENOUS

## 2021-05-16 MED ORDER — TACROLIMUS 1 MG PO CAP
6 mg | Freq: Once | ORAL | 0 refills | Status: CP
Start: 2021-05-16 — End: ?
  Administered 2021-05-16: 18:00:00 6 mg via ORAL

## 2021-05-16 MED ORDER — CEPHALEXIN 500 MG PO CAP
500 mg | Freq: Two times a day (BID) | ORAL | 0 refills | Status: AC
Start: 2021-05-16 — End: ?
  Administered 2021-05-17 – 2021-05-19 (×6): 500 mg via ORAL

## 2021-05-16 MED ORDER — PROCHLORPERAZINE EDISYLATE 5 MG/ML IJ SOLN
5 mg | Freq: Once | INTRAVENOUS | 0 refills | Status: CP
Start: 2021-05-16 — End: ?
  Administered 2021-05-16: 21:00:00 5 mg via INTRAVENOUS

## 2021-05-16 MED ORDER — PANTOPRAZOLE 40 MG PO TBEC
40 mg | Freq: Every day | ORAL | 0 refills | Status: AC
Start: 2021-05-16 — End: ?
  Administered 2021-05-17 – 2021-05-19 (×4): 40 mg via ORAL

## 2021-05-16 MED ORDER — HEPARIN, PORCINE (PF) 5,000 UNIT/0.5 ML IJ SYRG
5000 [IU] | SUBCUTANEOUS | 0 refills | Status: AC
Start: 2021-05-16 — End: ?
  Administered 2021-05-18: 03:00:00 5000 [IU] via SUBCUTANEOUS

## 2021-05-16 MED FILL — FUROSEMIDE 40 MG PO TAB: 40 mg | ORAL | 3 days supply | Qty: 3 | Fill #1 | Status: AC

## 2021-05-16 NOTE — ED Provider Notes
Brandy Joseph is a 40 y.o. female.    Chief Complaint:  Chief Complaint   Patient presents with   ? Abdominal pain     Kidney transplant last week. Abdominal pain that started in lower abdomen and is now all over. Started this morning at about 3 am. Has been taking opioids since surgery. Has been 3 days since last BM. Took miralax and phenergan today at about 7:15am.       History of Present Illness:  Brandy Joseph is a 40 year old female with past medical history of end-stage renal disease status post kidney transplant 1 week ago who presents with a 3-day history of abdominal pain, nausea, and constipation.  She has not had a bowel movement in 3 days.  She denies vomiting.  Denies fevers.  She has been taking her immunosuppressive medications as prescribed.  She took Zofran immediately prior to arrival and Phenergan 6 hours ago for nausea.  Denies shortness of breath and chest pain.  Reports pain at the incision site, but primarily her most severe abdominal pain is epigastric.          Review of Systems:  Review of Systems   Constitutional: Negative for fever.   HENT: Negative for sore throat.    Eyes: Negative for visual disturbance.   Respiratory: Negative for shortness of breath.    Cardiovascular: Negative for chest pain.   Gastrointestinal: Positive for abdominal pain and nausea. Negative for vomiting.   Genitourinary: Negative for dysuria.   Musculoskeletal: Negative for back pain.   Skin: Negative for rash.   Neurological: Negative for headaches.       Allergies:  Cephalexin, Ketorolac, and Sulfa (sulfonamide antibiotics)    Past Medical History:  Medical History:   Diagnosis Date   ? DM (diabetes mellitus) (HCC)    ? Gastroparesis    ? Hypertension    ? Kidney disease    ? Memory loss    ? Stroke [facility])        Past Surgical History:  Surgical History:   Procedure Laterality Date   ? ANKLE SURGERY Left 1996   ? FIBULA FRACTURE SURGERY Left 1996   ? ESOPHAGOGASTRODUODENOSCOPY WITH BIOPSY - FLEXIBLE N/A 03/29/2018    Performed by Virgina Organ, MD at [facility] ENDO   ? ESOPHAGOGASTRODUODENOSCOPY WITH BIOPSY - FLEXIBLE N/A 10/04/2019    Performed by Everardo All, MD at [facility] ENDO   ? COLONOSCOPY WITH BIOPSY - FLEXIBLE N/A 10/04/2019    Performed by Everardo All, MD at [facility] ENDO   ? ANGIOGRAPHY CORONARY ARTERY WITH LEFT HEART CATHETERIZATION N/A 01/18/2020    Performed by Reola Mosher, MD at [facility] CATH LAB   ? POSSIBLE PERCUTANEOUS CORONARY STENT PLACEMENT WITH ANGIOPLASTY N/A 01/18/2020    Performed by Reola Mosher, MD at [facility] CATH LAB   ? COLONOSCOPY WITH BIOPSY - FLEXIBLE N/A 05/15/2020    Performed by Eliott Nine, MD at [facility] ENDO   ? COLONOSCOPY WITH SNARE REMOVAL TUMOR/ POLYP/ OTHER LESION  05/15/2020    Performed by Eliott Nine, MD at [facility] ENDO   ? ALLOTRANSPLANTATION KIDNEY FROM NON LIVING DONOR WITHOUT RECIPIENT NEPHRECTOMY N/A 05/07/2021    Performed by Nicholos Johns, MD at [facility] OR   ? BURR HOLE      release pressure from stroke   ? DIALYSIS FISTULA CREATION     ? GALLBLADDER SURGERY     ? HX CESAREAN SECTION         Pertinent medical  and surgical history reviewed    Social History:  Social History     Tobacco Use   ? Smoking status: Never Smoker   ? Smokeless tobacco: Never Used   Vaping Use   ? Vaping Use: Never used   Substance Use Topics   ? Alcohol use: Not Currently     Alcohol/week: 0.0 standard drinks     Comment: rare, socially   ? Drug use: Never     Social History     Substance and Sexual Activity   Drug Use Never             Family History:  Family History   Problem Relation Age of Onset   ? Stroke Mother    ? Diabetes Paternal Grandmother    ? Heart problem Paternal Grandmother    ? Hypertension Paternal Grandmother    ? Tumor Maternal Grandmother         brain   ? None Reported Son        Vitals:  ED Vitals    Date and Time T BP P RR SPO2P SPO2 User   05/16/21 1400 -- 183/73 82 12 PER MINUTE 82 100 % RC   05/16/21 1230 -- 190/74 88 11 PER MINUTE 88 100 % RT   05/16/21 1209 -- 180/67 85 14 PER MINUTE 86 100 % RT   05/16/21 1001 -- 163/60 77 11 PER MINUTE 77 100 % RT   05/16/21 0941 -- 175/79 79 15 PER MINUTE 78 100 % MB   05/16/21 0938 36.8 ?C (98.2 ?F) -- -- -- -- -- MB          Physical Exam:  Physical Exam  Vitals and nursing note reviewed.   Constitutional:       Appearance: Normal appearance. She is normal weight.   HENT:      Head: Normocephalic and atraumatic.   Eyes:      Extraocular Movements: Extraocular movements intact.      Conjunctiva/sclera: Conjunctivae normal.   Cardiovascular:      Rate and Rhythm: Normal rate and regular rhythm.   Pulmonary:      Effort: Pulmonary effort is normal. No respiratory distress.   Abdominal:      General: There is no distension.      Palpations: Abdomen is soft.      Tenderness: There is abdominal tenderness (Epigastric).   Musculoskeletal:         General: Normal range of motion.      Cervical back: Neck supple.   Neurological:      Mental Status: She is oriented to person, place, and time. Mental status is at baseline.   Psychiatric:         Mood and Affect: Mood normal.         Behavior: Behavior normal.         Laboratory Results:  Labs Reviewed   CBC AND DIFF - Abnormal       Result Value Ref Range Status    White Blood Cells 9.0  4.5 - 11.0 K/UL Final    RBC 3.35 (*) 4.0 - 5.0 M/UL Final    Hemoglobin 10.3 (*) 12.0 - 15.0 GM/DL Final    Hematocrit 16.1 (*) 36 - 45 % Final    MCV 93.0  80 - 100 FL Final    MCH 30.8  26 - 34 PG Final    MCHC 33.1  32.0 - 36.0 G/DL Final    RDW 12.5  11 - 15 % Final    Platelet Count 261  150 - 400 K/UL Final    MPV 8.4  7 - 11 FL Final    Neutrophils 84 (*) 41 - 77 % Final    Lymphocytes 9 (*) 24 - 44 % Final    Monocytes 6  4 - 12 % Final    Eosinophils 1  0 - 5 % Final    Basophils 0  0 - 2 % Final    Absolute Neutrophil Count 7.65 (*) 1.8 - 7.0 K/UL Final    Absolute Lymph Count 0.79 (*) 1.0 - 4.8 K/UL Final    Absolute Monocyte Count 0.53  0 - 0.80 K/UL Final    Absolute Eosinophil Count 0.04  0 - 0.45 K/UL Final Absolute Basophil Count 0.03  0 - 0.20 K/UL Final    MDW (Monocyte Distribution Width) 16.0  <20.7 Final   COMPREHENSIVE METABOLIC PANEL - Abnormal    Sodium 133 (*) 137 - 147 MMOL/L Final    Potassium 4.1  3.5 - 5.1 MMOL/L Final    Chloride 99  98 - 110 MMOL/L Final    Glucose 302 (*) 70 - 100 MG/DL Final    Blood Urea Nitrogen 27 (*) 7 - 25 MG/DL Final    Creatinine 1.61 (*) 0.4 - 1.00 MG/DL Final    Calcium 9.1  8.5 - 10.6 MG/DL Final    Total Protein 6.8  6.0 - 8.0 G/DL Final    Total Bilirubin 0.6  0.3 - 1.2 MG/DL Final    Albumin 3.9  3.5 - 5.0 G/DL Final    Alk Phosphatase 90  25 - 110 U/L Final    AST (SGOT) 16  7 - 40 U/L Final    CO2 21  21 - 30 MMOL/L Final    ALT (SGPT) 33  7 - 56 U/L Final    Anion Gap 13 (*) 3 - 12 Final    eGFR 48 (*) >60 mL/min Final   LIPASE - Abnormal    Lipase 5 (*) 11 - 82 U/L Final   MAGNESIUM - Abnormal    Magnesium 1.2 (*) 1.6 - 2.6 mg/dL Final   URINALYSIS DIPSTICK REFLEX TO CULTURE - Abnormal    Color,UA STRAW   Final    Turbidity,UA CLEAR  CLEAR-CLEAR Final    Specific Gravity-Urine 1.011  1.005 - 1.030 Final    pH,UA 6.0  5.0 - 8.0 Final    Protein,UA 1+ (*) NEG-NEG Final    Glucose,UA 3+ (*) NEG-NEG Final    Ketones,UA 1+ (*) NEG-NEG Final    Bilirubin,UA NEG  NEG-NEG Final    Blood,UA 2+ (*) NEG-NEG Final    Urobilinogen,UA NORMAL  NORM-NORMAL Final    Nitrite,UA NEG  NEG-NEG Final    Leukocytes,UA TRACE (*) NEG-NEG Final    Urine Ascorbic Acid, UA NEG  NEG-NEG Final   CULTURE-BLOOD W/SENSITIVITY   CULTURE-BLOOD W/SENSITIVITY   CULTURE-BLOOD W/SENSITIVITY   URINALYSIS MICROSCOPIC REFLEX TO CULTURE    WBCs,UA 2-10  0 - 2 /HPF Final    RBCs,UA 2-10  0 - 3 /HPF Final    Comment,UA     Final    Value: Criteria for reflex to culture are WBC>10, Positive Nitrite, and/or >=+1   leukocytes. If quantity is not sufficient, an addendum will follow.      Squamous Epithelial Cells 0-2  0 - 5 Final   POC LACTATE    LACTIC ACID POC 0.8  0.5 - 2.0 MMOL/L Final   POC TROPONIN Troponin-I-POC 0.00  0.00 - 0.05 NG/ML Final   BETA-HCG    Beta-HCG,Serum <1  <5 U/L Final   UA GREY TOP TUBE   CLEAR TOP EXTRA URINE TUBE   POC LACTATE   POC LACTATE   POC URINE PREGNANCY     Urine Pregnancy  Urine Pregnancy: Negative  QC: Acceptable  Urine Pregnancy Lot #: [id number]    Radiology Interpretation:    CT ABD/PELV WO CONTRAST   Final Result         Interval renal transplantation without hydronephrosis or significant peritransplant fluid collection. No bowel obstruction is identified.          Finalized by Prince Rome, D.O. on 05/16/2021 3:43 PM. Dictated by Prince Rome, D.O. on 05/16/2021 3:34 PM.         US KIDNEY TRANSPLANT   Final Result         1. Normal appearance of the renal transplant. No hydronephrosis or evidence of vascular compromise to the graft.   2. Slight decrease in the size of the small fluid collection between the transplant kidney and the bladder. Again, this may represent evolving postoperative hematoma/seroma or a small physiologic right ovarian cyst.   3. Moderately distended bladder.          Finalized by Kathyrn Lass, M.D. on 05/16/2021 11:59 AM. Dictated by Kathyrn Lass, M.D. on 05/16/2021 11:49 AM.               EKG:      ED Course:  Patient is a 40 y.o. female with a past medical history of ESRD status post kidney transplant 1 week ago who presented to the ED for abdominal pain, nausea, vomiting.    - Initial vitals were significant for hypertension, remainder vitals within normal limits  - See physical exam as noted above  - Differential diagnosis includes but is not limited to bowel obstruction, postoperative ileus, acute rejection, intra-abdominal infection    - Labs were obtained and showed CBC without leukocytosis.  Stable appearing anemia.  CMP with improving creatinine from previous measurement.  Potassium within normal limits.  Initial lactate within normal limits.  - Imaging was obtained and showed ultrasound of the kidney with normal appearance of the renal transplant without hydronephrosis or vascular compromise.  CT abdomen and pelvis with interval renal transplantation without hydronephrosis or significant fluid collection.  No bowel obstruction was identified.    ED Course:  Patient was seen and evaluated by resident and attending physician.  Initial concern were listed above.  Ultrasounds were obtained and were negative for significant intra-abdominal or transplant related complications.  Multiple doses of pain medications required to control patient's pain, including fentanyl and Dilaudid.  Patient took Zofran immediately prior to arrival and patient was also given Phenergan and Compazine for additional nausea control.  At this time, ultimately patient's disposition will be based on symptom control, additionally will consult transplant surgery since the patient is only 1 week out from recent renal transplant.  Per retrospective chart review, patient will be admitted to transplant service for observation and further evaluation of her symptoms.  Patient was agreeable to this plan.    - Findings discussed with patient and the plan for admission. Patient understands and agrees with plan. Patient reassessed with appropriate vital signs and symptom control for admission. Patient verbalized understanding. All questions were answered prior to admission.     -Dispo: Admit to transplant  ED Scoring:                              Coding    Facility Administered Meds:  Medications   fentaNYL citrate PF (SUBLIMAZE) injection 75 mcg (75 mcg Intravenous Given 05/16/21 1043)   tacrolimus (PROGRAF) capsule 6 mg (6 mg Oral Given 05/16/21 1229)   promethazine (PHENERGAN) tablet 12.5 mg (12.5 mg Oral Given 05/16/21 1226)   predniSONE (DELTASONE) tablet 20 mg (20 mg Oral Given 05/16/21 1226)   HYDROmorphone injection (DILAUDID) 1 mg (1 mg Intravenous Given 05/16/21 1432)   prochlorperazine (COMPAZINE) injection 5 mg (5 mg Intravenous Given 05/16/21 1504)       Active Problem List/Diagnosis Related to Current Presentation:    Clinical Impression:  Clinical Impression   Epigastric pain       Disposition/Follow up  ED Disposition     ED Disposition   Admit           No follow-up provider specified.    Medications:  New Prescriptions    No medications on file       Procedure Notes:  Procedures      Attestation / Supervision:      Darlyn Chamber, MD  Emergency Medicine PGY-2

## 2021-05-16 NOTE — Progress Notes
Vascular Access Team consulted to obtain lab specimen.    Ultrasound Used: Yes    How Many Attempts: 1    At 1425 approximately 20 ml of blood obtained from R AC. Patient tolerated the procedure well. Specimen labeled and given to primary RN

## 2021-05-16 NOTE — Telephone Encounter
Brandy Joseph mother of the pt called pt is having pain in her upper abdomen. Transferred to [place].

## 2021-05-16 NOTE — Consults
[facility] [facility] Surgery Consult     Patient: Brandy Joseph, [id number]  Admission Date:  05/16/2021, LOS: 0 days  Admission Diagnosis: Constipation [K59.00]  Date of Service: May 16, 2021  Consults     ASSESSMENT:   Brandy Joseph is a 40 y.o. 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 Brooke Dare, who directed plan of care.    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 She reports worsening epigastric abdominal pain that started when she woke up this morning. She has had nausea without vomiting, and constipation with last bowel movement ~3 days ago. Denies fever, chills, SOA, chest pain, diarrhea, changes in urination. She reports that she is making good urine since transplant, ~2000-2300 mL/day. CT shows no hydronephrosis or significant peritransplant fluid collection, stool in colon, no bowel obstruction is identified. Hgb 10.3 WBC 9.0, Cr 1.4    Medical History:   Diagnosis Date   ? DM (diabetes mellitus) (HCC)    ? Gastroparesis    ? Hypertension    ? Kidney disease    ? Memory loss    ? Stroke [facility])      Surgical History:   Procedure Laterality Date   ? ANKLE SURGERY Left 1996   ? FIBULA FRACTURE SURGERY Left 1996   ? ESOPHAGOGASTRODUODENOSCOPY WITH BIOPSY - FLEXIBLE N/A 03/29/2018    Performed by Virgina Organ, MD at [facility] ENDO   ? ESOPHAGOGASTRODUODENOSCOPY WITH BIOPSY - FLEXIBLE N/A 10/04/2019    Performed by Everardo All, MD at [facility] ENDO   ? COLONOSCOPY WITH BIOPSY - FLEXIBLE N/A 10/04/2019    Performed by Everardo All, MD at [facility] ENDO   ? ANGIOGRAPHY CORONARY ARTERY WITH LEFT HEART CATHETERIZATION N/A 01/18/2020    Performed by Reola Mosher, MD at [facility] CATH LAB   ? POSSIBLE PERCUTANEOUS CORONARY STENT PLACEMENT WITH ANGIOPLASTY N/A 01/18/2020    Performed by Reola Mosher, MD at [facility] CATH LAB   ? COLONOSCOPY WITH BIOPSY - FLEXIBLE N/A 05/15/2020    Performed by Eliott Nine, MD at [facility] ENDO   ? COLONOSCOPY WITH SNARE REMOVAL TUMOR/ POLYP/ OTHER LESION  05/15/2020    Performed by Eliott Nine, MD at [facility] ENDO   ? ALLOTRANSPLANTATION KIDNEY FROM NON LIVING DONOR WITHOUT RECIPIENT NEPHRECTOMY N/A 05/07/2021    Performed by Nicholos Johns, MD at [facility] OR   ? BURR HOLE      release pressure from stroke   ? DIALYSIS FISTULA CREATION     ? GALLBLADDER SURGERY     ? HX CESAREAN SECTION       Family History   Problem Relation Age of Onset   ? Stroke Mother    ? Diabetes Paternal Grandmother    ? Heart problem Paternal Grandmother    ? Hypertension Paternal Grandmother    ? Tumor Maternal Grandmother         brain   ? None Reported Son      Social History     Tobacco Use   ? Smoking  status: Never Smoker   ? Smokeless tobacco: Never Used   Substance Use Topics   ? Alcohol use: Not Currently     Alcohol/week: 0.0 standard drinks     Comment: rare, socially     Your Current Medications:       Instructions    acetaminophen (TYLENOL) 325 mg tablet Take two tablets by mouth every 4 hours as needed.    albuterol sulfate (PROAIR HFA) 90 mcg/actuation HFA aerosol inhaler Use as directed prior to pentamide treatments. Shake well before use    buPROPion XL (WELLBUTRIN XL) 150 mg tablet Take 150 mg by mouth every morning. Do not crush or chew.    carvediloL (COREG) 25 mg tablet Take one tablet by mouth twice daily. Take with food.    cephalexin (KEFLEX) 500 mg capsule Take one capsule by mouth twice daily. Indications: infection prophylaxis, medical    cloNIDine (CATAPRESS) 0.2 mg tablet Take one tablet by mouth twice daily.    docusate (COLACE) 100 mg capsule Take one capsule by mouth twice daily. ergocalciferol (vitamin D2) (DRISDOL) 1,250 mcg (50,000 unit) capsule Take one capsule by mouth every 7 days.    furosemide (LASIX) 40 mg tablet Take one tablet by mouth every morning for 3 days. HOLD MEDICATION AS OF 05/14/2021 DO NOT TAKE UNTIL FURTHER INSTRUCTED    insulin lispro(+) (HUMALOG U-100 INSULIN) 100 unit/mL injection Maximum of 50 units daily. Via insulin pump. Use 1:6 insulin to carb ratio and the following correction factor Glucose 140-180 = 1 units Glucose 181-220 = 2 units Glucose 221-260 = 3 units Glucose 261-300 = 4 units Glucose 301-350 = 5 units Glucose 351-400 = 6 units Glucose >400 = 7 units    insulin pen needles (disposable) (BD UF NANO PEN NEEDLES) 32 gauge x 5/32 pen needle Use one each as directed as Needed. Use with insulin injections.    Insulin Syringe-Needle U-100 (BD INSULIN SYRINGE ULTRA-FINE) 1 mL 31 gauge x 5/16 syrg Use with insulin vials as directed    lidocaine (LIDODERM) 5 % topical patch Apply one patch topically to affected area daily. Apply patch for 12 hours, then remove for 12 hours before repeating.    mycophenolate sodium (MYFORTIC) 180 mg tablet,delayed release Take four tablets by mouth twice daily.    nystatin (MYCOSTATIN) 100,000 units/mL oral suspension Take 5 mL by mouth four times daily for 26 days.    ondansetron (ZOFRAN ODT) 4 mg rapid dissolve tablet Dissolve one tablet by mouth every 8 hours as needed for Nausea or Vomiting. Place on tongue to dissolve.    pantoprazole DR (PROTONIX) 40 mg tablet Take one tablet by mouth daily.    polyethylene glycol 3350 (MIRALAX) 17 gram/dose powder Take seventeen grams by mouth daily.    predniSONE (DELTASONE) 5 mg tablet Take 20mg  (4 tabs) 11/6-11/15, then take 15mg  (3 tabs) 11/16-11/30, then take 10mg  (2 tabs) daily thereafter.    promethazine (PHENERGAN) 12.5 mg tablet Take one tablet by mouth every 8 hours as needed for Nausea or Vomiting.    rosuvastatin (CRESTOR) 20 mg tablet Take one-half tablet by mouth daily. senna/docusate (SENOKOT-S) 8.6/50 mg tablet Take two tablets by mouth twice daily. Hold for loose stools    tacrolimus (PROGRAF) 1 mg capsule Take six capsules by mouth twice daily.    tamsulosin (FLOMAX) 0.4 mg capsule Take one capsule by mouth daily after breakfast. Do not crush, chew or open capsules. Take 30 minutes following the same meal each day.    traMADoL (  ULTRAM) 50 mg tablet Take one tablet by mouth every 6 hours as needed.    valGANciclovir (VALCYTE) 450 mg tablet Take two tablets by mouth daily with breakfast.          Review of Systems:  A 12 point review of symptoms was performed and was negative unless otherwise noted in the HPI.    Vitals:  BP: (154-190)/(60-79)   Temp:  [36.8 ?C (98.2 ?F)]   Pulse:  [77-88]   Respirations:  [9 PER MINUTE-15 PER MINUTE]   SpO2:  [96 %-100 %]     Physical Exam:  GEN: alert and oriented no acute distress  HEENT: normocephalic, atraumatic  CARDIO: RRR, peripheral pulses wnl  PULM: normal work of breathing, no stridor, on RA  ABD: soft, epigastrium TTP, no rebound, no guarding, RLQ incision healing well, no erythema/drainage, with staples c/d/i  EXT: no c/c/e, warm, well-perfused  NEURO: motor and sensation grossly intact  PSYCH: cooperative, appropriate mood and affect    Lab/Radiology/Other Diagnostic Tests:  Lab Results   Component Value Date/Time    NA 133 (L) 05/16/2021 10:15 AM    K 4.1 05/16/2021 10:15 AM    CL 99 05/16/2021 10:15 AM    CO2 21 05/16/2021 10:15 AM    BUN 27 (H) 05/16/2021 10:15 AM    CR 1.40 (H) 05/16/2021 10:15 AM    MG 1.2 (L) 05/16/2021 10:15 AM    PO4 2.2 05/14/2021 07:47 AM        Lab Results   Component Value Date/Time    HGB 10.3 (L) 05/16/2021 10:15 AM    HCT 31.1 (L) 05/16/2021 10:15 AM    WBC 9.0 05/16/2021 10:15 AM    PLTCT 261 05/16/2021 10:15 AM    INR 1.0 05/07/2021 04:37 PM     Lab Results   Component Value Date/Time    GLUPOC 158 (H) 05/11/2021 12:19 PM    GLUPOC 264 (H) 05/11/2021 08:26 AM    GLUPOC 184 (H) 05/10/2021 10:38 PM CT ABD/PELV WO CONTRAST   Final Result         Interval renal transplantation without hydronephrosis or significant peritransplant fluid collection. No bowel obstruction is identified.          Finalized by Prince Rome, D.O. on 05/16/2021 3:43 PM. Dictated by Prince Rome, D.O. on 05/16/2021 3:34 PM.         US KIDNEY TRANSPLANT   Final Result         1. Normal appearance of the renal transplant. No hydronephrosis or evidence of vascular compromise to the graft.   2. Slight decrease in the size of the small fluid collection between the transplant kidney and the bladder. Again, this may represent evolving postoperative hematoma/seroma or a small physiologic right ovarian cyst.   3. Moderately distended bladder.          Finalized by Kathyrn Lass, M.D. on 05/16/2021 11:59 AM. Dictated by Kathyrn Lass, M.D. on 05/16/2021 11:49 AM.

## 2021-05-17 ENCOUNTER — Inpatient Hospital Stay: Admit: 2021-05-17 | Discharge: 2021-05-17 | Payer: MEDICARE

## 2021-05-17 ENCOUNTER — Encounter: Admit: 2021-05-17 | Discharge: 2021-05-17 | Payer: MEDICARE

## 2021-05-17 MED ADMIN — POTASSIUM CHLORIDE IN WATER 10 MEQ/50 ML IV PGBK [11075]: 10 meq | INTRAVENOUS | @ 17:00:00 | Stop: 2021-05-17 | NDC 00338070541

## 2021-05-17 MED ADMIN — TACROLIMUS 1 MG PO CAP [82570]: 6 mg | ORAL | NDC 00469061773

## 2021-05-17 MED ADMIN — MAGNESIUM SULFATE IN D5W 1 GRAM/100 ML IV PGBK [166578]: 1 g | INTRAVENOUS | @ 21:00:00 | Stop: 2021-05-18 | NDC 00409672723

## 2021-05-17 MED ADMIN — PROCHLORPERAZINE EDISYLATE 5 MG/ML IJ SOLN [6580]: 10 mg | INTRAVENOUS | @ 14:00:00 | NDC 23155029431

## 2021-05-17 MED ADMIN — DEXTROSE 5%-0.45% SODIUM CHLORIDE IV SOLP [15861]: 2000 mL | INTRAVENOUS | @ 22:00:00 | Stop: 2021-05-17 | NDC 00338008504

## 2021-05-17 MED ADMIN — PREDNISONE 20 MG PO TAB [6496]: 20 mg | ORAL | @ 20:00:00 | NDC 00054001820

## 2021-05-17 MED ADMIN — DEXTROSE 5%-0.45% SODIUM CHLORIDE IV SOLP [15861]: 1000.000 mL | INTRAVENOUS | @ 18:00:00 | Stop: 2021-05-17 | NDC 00338008504

## 2021-05-17 MED ADMIN — TACROLIMUS 1 MG PO CAP [82570]: 3 mg | SUBLINGUAL | @ 13:00:00 | Stop: 2021-05-17 | NDC 00469061773

## 2021-05-17 MED ADMIN — ONDANSETRON 8 MG PO TBDI [79332]: 8 mg | ORAL | @ 14:00:00 | NDC 65862039110

## 2021-05-17 MED ADMIN — INSULIN REGULAR IN 0.9 % NACL 100 UNIT/100 ML (1 UNIT/ML) IV SOLN [451023]: 0.1 [IU]/kg/h | INTRAVENOUS | @ 12:00:00 | Stop: 2021-05-17 | NDC 00338012612

## 2021-05-17 MED ADMIN — SODIUM CHLORIDE 0.9 % IV SOLP [27838]: 250 mL | INTRAVENOUS | @ 17:00:00 | Stop: 2021-05-17 | NDC 00338004902

## 2021-05-17 MED ADMIN — SODIUM CHLORIDE 0.45 % IV SOLP [7318]: 1000.000 mL | INTRAVENOUS | @ 12:00:00 | Stop: 2021-05-17 | NDC 00338004304

## 2021-05-17 MED ADMIN — HYDRALAZINE 20 MG/ML IJ SOLN [3697]: 10 mg | INTRAVENOUS | @ 10:00:00 | NDC 00517090101

## 2021-05-17 MED ADMIN — POTASSIUM CHLORIDE IN WATER 10 MEQ/50 ML IV PGBK [11075]: 10 meq | INTRAVENOUS | @ 20:00:00 | Stop: 2021-05-17 | NDC 00338070541

## 2021-05-17 MED ADMIN — INSULIN REGULAR IN 0.9 % NACL 100 UNIT/100 ML (1 UNIT/ML) IV SOLN [451023]: 0.05 [IU]/kg/h | INTRAVENOUS | @ 21:00:00 | Stop: 2021-05-17 | NDC 00338012612

## 2021-05-17 MED ADMIN — MAGNESIUM SULFATE IN D5W 1 GRAM/100 ML IV PGBK [166578]: 1 g | INTRAVENOUS | @ 20:00:00 | Stop: 2021-05-17 | NDC 00409672723

## 2021-05-17 MED ADMIN — LACTATED RINGERS IV SOLP [4318]: 1000 mL | INTRAVENOUS | @ 10:00:00 | Stop: 2021-05-17 | NDC 00338011704

## 2021-05-17 MED ADMIN — PROCHLORPERAZINE EDISYLATE 5 MG/ML IJ SOLN [6580]: 10 mg | INTRAVENOUS | @ 09:00:00 | Stop: 2021-05-17 | NDC 23155029431

## 2021-05-17 MED ADMIN — TACROLIMUS 5 MG PO CAP [78307]: 6 mg | ORAL | NDC 00469065773

## 2021-05-17 NOTE — ED Notes
Patient report from Rebecca, RN

## 2021-05-17 NOTE — Progress Notes
05/16/21 2252   Vitals   BP (!) 178/77   Mean NBP (Calculated) 111 MM HG   BP Source Arm, Right Upper   BP Method Automatic   BP Patient Position Head of bed (Comment degree)     MD Creswell made aware of increased BP. Pt also complaining of nausea. New orders given.

## 2021-05-17 NOTE — Progress Notes
RT Adult Assessment Note    NAME:Brandy Joseph             MRN: [id number]             DOB:11-Feb-1981          AGE: 40 y.o.  ADMISSION DATE: 05/16/2021             DAYS ADMITTED: LOS: 0 days    RT Treatment Plan:       Protocol Plan: Procedures  PAP: Place a nursing order for "IS Q1h While Awake" for any of Lung Expansion indicators    Additional Comments:  Impressions of the patient: Patient is resting on RA, no respiratory complaints  Intervention(s)/outcome(s): See above care plan  Patient education that was completed: n/a  Recommendations to the care team: n/a    Vital Signs:  Pulse: 88  RR: 18 PER MINUTE  SpO2: 97 %  O2 Device: None (Room air)  Liter Flow:    O2%:    Breath Sounds: Clear (Implies normal);Decreased  Respiratory Effort:

## 2021-05-17 NOTE — Telephone Encounter
Returned caregiver, Tess's call.  She wanted to give update that pt has been admitted.

## 2021-05-18 MED ADMIN — TACROLIMUS 5 MG PO CAP [78307]: 6 mg | ORAL | @ 12:00:00 | NDC 00469065773

## 2021-05-18 MED ADMIN — VALGANCICLOVIR 450 MG PO TAB [77195]: 450 mg | ORAL | @ 16:00:00 | NDC 31722083260

## 2021-05-18 MED ADMIN — MAGNESIUM SULFATE IN D5W 1 GRAM/100 ML IV PGBK [166578]: 1 g | INTRAVENOUS | @ 01:00:00 | Stop: 2021-05-18 | NDC 00409672723

## 2021-05-18 MED ADMIN — MAGNESIUM SULFATE IN D5W 1 GRAM/100 ML IV PGBK [166578]: 1 g | INTRAVENOUS | @ 05:00:00 | Stop: 2021-05-18 | NDC 00409672723

## 2021-05-18 MED ADMIN — PREDNISONE 20 MG PO TAB [6496]: 20 mg | ORAL | @ 16:00:00 | NDC 00054001820

## 2021-05-18 MED ADMIN — TACROLIMUS 1 MG PO CAP [82570]: 6 mg | ORAL | @ 12:00:00 | NDC 00469061773

## 2021-05-19 ENCOUNTER — Encounter: Admit: 2021-05-19 | Discharge: 2021-05-19 | Payer: MEDICARE

## 2021-05-19 MED ADMIN — MAGNESIUM OXIDE 400 MG (241.3 MG MAGNESIUM) PO TAB [10491]: 800 mg | ORAL | @ 19:00:00 | Stop: 2021-05-19 | NDC 10006070028

## 2021-05-19 MED ADMIN — PREDNISONE 20 MG PO TAB [6496]: 20 mg | ORAL | @ 15:00:00 | Stop: 2021-05-19 | NDC 00054001820

## 2021-05-19 MED ADMIN — VALGANCICLOVIR 450 MG PO TAB [77195]: 450 mg | ORAL | @ 15:00:00 | Stop: 2021-05-19 | NDC 31722083260

## 2021-05-19 MED ADMIN — TACROLIMUS 5 MG PO CAP [78307]: 6 mg | ORAL | NDC 00469065773

## 2021-05-19 MED ADMIN — TACROLIMUS 1 MG PO CAP [82570]: 6 mg | ORAL | NDC 00469061773

## 2021-05-19 MED ADMIN — TACROLIMUS 1 MG PO CAP [82570]: 6 mg | ORAL | @ 12:00:00 | Stop: 2021-05-19 | NDC 00469061773

## 2021-05-19 MED ADMIN — TACROLIMUS 5 MG PO CAP [78307]: 6 mg | ORAL | @ 12:00:00 | Stop: 2021-05-19 | NDC 00469065773

## 2021-05-19 MED FILL — BISACODYL 10 MG RE SUPP: 10 mg | RECTAL | 2 days supply | Qty: 4 | Fill #1 | Status: CP

## 2021-05-20 ENCOUNTER — Encounter: Admit: 2021-05-20 | Discharge: 2021-05-20 | Payer: MEDICARE

## 2021-05-20 DIAGNOSIS — Z940 Kidney transplant status: Secondary | ICD-10-CM

## 2021-05-20 DIAGNOSIS — Z79899 Other long term (current) drug therapy: Secondary | ICD-10-CM

## 2021-05-20 DIAGNOSIS — Z9189 Other specified personal risk factors, not elsewhere classified: Secondary | ICD-10-CM

## 2021-05-20 DIAGNOSIS — N189 Chronic kidney disease, unspecified: Secondary | ICD-10-CM

## 2021-05-20 DIAGNOSIS — D849 Immunodeficiency, unspecified: Secondary | ICD-10-CM

## 2021-05-20 MED ORDER — TACROLIMUS 1 MG PO CAP
4 mg | ORAL_CAPSULE | Freq: Two times a day (BID) | ORAL | 3 refills | Status: CN
Start: 2021-05-20 — End: ?

## 2021-05-20 MED ORDER — DARBEPOETIN ALFA IN POLYSORBAT 100 MCG/0.5 ML IJ SYRG
100 ug | SUBCUTANEOUS | 0 refills | Status: AC
Start: 2021-05-20 — End: ?
  Administered 2021-05-21: 15:00:00 100 ug via SUBCUTANEOUS

## 2021-05-20 NOTE — Telephone Encounter
Patient requesting tramadol refill. Patient reports 5-7/10 in back (chronic) and 5/10 incisional pain. She has 3 tramadol left. Lidocaine patches not covered by insurance. Advised to purchase OTC. Consulted with [facility] surgical resident and no refills prescribed. Advised patient to follow up with PCP for further pain management if necessary . Advised patient to try alternating heat/ice on back. Patient acknowledged.     Reviewed labs from 05/19/2021 at this time    Hgb 9.3  WBC 8.6  Creatinine 1.37   Mg 1.5  Phos 2.8  Tacrolimus 13.5  (Goal 8-12)    Patient reporting some burning with urination. Will get labs tomorrow.     Received the following orders from Dr. Lyndel Safe :  Reduce tacrolimus to 4/4 (from 6/6)    Reviewed labs with patient at this time via phone.   Gave dose change to patient at this time and patient repeated it back to me correctly  Medication list updated

## 2021-05-20 NOTE — Telephone Encounter
Brandy Joseph called to r/s a f/u appt w/ Dr Arie Sabina. Call was dropped please call her back.

## 2021-05-21 ENCOUNTER — Encounter: Admit: 2021-05-21 | Discharge: 2021-05-21 | Payer: MEDICARE

## 2021-05-21 ENCOUNTER — Ambulatory Visit: Admit: 2021-05-21 | Discharge: 2021-05-21 | Payer: MEDICARE

## 2021-05-21 DIAGNOSIS — Z79899 Other long term (current) drug therapy: Secondary | ICD-10-CM

## 2021-05-21 DIAGNOSIS — Z005 Encounter for examination of potential donor of organ and tissue: Secondary | ICD-10-CM

## 2021-05-21 DIAGNOSIS — Z940 Kidney transplant status: Secondary | ICD-10-CM

## 2021-05-21 DIAGNOSIS — D849 Immunodeficiency, unspecified: Secondary | ICD-10-CM

## 2021-05-21 LAB — CBC AND DIFF
ABSOLUTE BASO COUNT: 0 K/UL (ref 0–0.20)
ABSOLUTE EOS COUNT: 0 K/UL (ref 0–0.45)
ABSOLUTE LYMPH COUNT: 1 K/UL (ref 1.0–4.8)
ABSOLUTE MONO COUNT: 0.4 K/UL (ref 0–0.80)
ABSOLUTE NEUTROPHIL: 8.8 K/UL — ABNORMAL HIGH (ref 1.8–7.0)
BASOPHILS %: 1 % (ref 0–2)
EOSINOPHILS %: 0 % (ref >60)
HEMATOCRIT: 30 % — ABNORMAL LOW (ref 36–45)
HEMOGLOBIN: 10 g/dL — ABNORMAL LOW (ref 12.0–15.0)
LYMPHOCYTES %: 10 % — ABNORMAL LOW (ref 24–44)
MCH: 31 pg (ref 26–34)
MCHC: 33 g/dL (ref 32.0–36.0)
MCV: 94 FL (ref 80–100)
MONOCYTES %: 4 % (ref 4–12)
MPV: 7.9 FL (ref 7–11)
NEUTROPHILS %: 85 % — ABNORMAL HIGH (ref 41–77)
PLATELET COUNT: 278 K/UL (ref 150–400)
RBC COUNT: 3.2 M/UL — ABNORMAL LOW (ref 4.0–5.0)
RDW: 12 % (ref 11–15)
WBC COUNT: 10 K/UL (ref 4.5–11.0)

## 2021-05-21 LAB — URINALYSIS MICROSCOPIC REFLEX TO CULTURE

## 2021-05-21 LAB — TACROLIMUS LC-MS/MS: TACROLIMUS LC-MS/MS: 10

## 2021-05-21 LAB — CMV QUANT PCR-BLOOD: IU/ML CMV BLOOD: <50

## 2021-05-21 LAB — PHOSPHORUS: PHOSPHORUS: 1.7 mg/dL — ABNORMAL LOW (ref 2.0–4.5)

## 2021-05-21 LAB — PROTEIN/CR RATIO,UR RAN
PROT CREAT RAT/CAL: 1.3 — ABNORMAL HIGH (ref <0.15)
UR CREATININE, RAN: 36 mg/dL (ref 5.0–8.0)
UR TOTAL PROTEIN,RAN: 46 mg/dL (ref 1.005–1.030)

## 2021-05-21 LAB — COMPREHENSIVE METABOLIC PANEL
POTASSIUM: 3.9 MMOL/L (ref 3.5–5.1)
SODIUM: 138 MMOL/L (ref 137–147)

## 2021-05-21 LAB — URIC ACID: URIC ACID: 4.7 mg/dL (ref 2.0–7.0)

## 2021-05-21 LAB — URINALYSIS DIPSTICK REFLEX TO CULTURE: URINE BILE: NEGATIVE

## 2021-05-21 LAB — BK VIRUS DNA, QUANT PLASMA

## 2021-05-21 LAB — MAGNESIUM: MAGNESIUM: 1.1 mg/dL — ABNORMAL LOW (ref 1.6–2.6)

## 2021-05-21 MED ORDER — POTASSIUM, SODIUM PHOSPHATES 280-160-250 MG PO PWPK
1 | PACK | Freq: Every day | ORAL | 0 refills | Status: AC
Start: 2021-05-21 — End: ?

## 2021-05-21 MED ORDER — MAGNESIUM OXIDE 400 MG (241.3 MG MAGNESIUM) PO TAB
800 mg | ORAL_TABLET | Freq: Every day | ORAL | 5 refills | Status: AC
Start: 2021-05-21 — End: ?

## 2021-05-21 MED ORDER — POTASSIUM, SODIUM PHOSPHATES 280-160-250 MG PO PWPK
1 | PACK | Freq: Every day | ORAL | 0 refills | Status: DC
Start: 2021-05-21 — End: 2021-05-21

## 2021-05-21 NOTE — Telephone Encounter
Reviewed labs from 05/21/2021 at this time    Hgb 10.3  WBC 10.4  Creatinine 1.11    Mg 1.1  Phos 1.7  Tacrolimus pending (Goal 8-12)  UPC 1.3    Received the following orders from Dr. Lyndel Safe :  Start Phos Nak 1 packet daily x 14 days     Reviewed labs with patient at this time via phone  Gave dose change to patient at this time and patient repeated it back to me correctly  Medication list updated

## 2021-05-21 NOTE — Telephone Encounter
Received an incoming call from:    [place] Name: Tess, Transplant Labs    Relationship to Patient: n/a    Callback Number: [phone]    Purpose of Call: Tess called in regards to pt labs. Stated that she has had diarrhea but also wanted to notify kidney that the pt urine output has went down some. Please call back.

## 2021-05-21 NOTE — Progress Notes
Transplant Pharmacist Managed Valganciclovir    Brandy Joseph [place] received a kidney transplant on 05/07/2021.    Allergies  Allergies   Allergen Reactions   ? Cephalexin RASH   ? Ketorolac RASH   ? Sulfa (Sulfonamide Antibiotics) EDEMA     Full body swelling; eye swelling; itchiness       Pertinent Labs  CBC w diff  CBC with Diff Latest Ref Rng & Units 05/21/2021 05/19/2021 05/17/2021 05/16/2021 05/14/2021   WBC 4.5 - 11.0 K/UL 10.4 8.6 8.7 9.0 7.9   RBC 4.0 - 5.0 M/UL 3.28(L) 2.95(L) 3.27(L) 3.35(L) 3.36(L)   HGB 12.0 - 15.0 GM/DL 10.3(L) 9.3(L) 10.3(L) 10.3(L) 10.7(L)   HCT 36 - 45 % 30.9(L) 27.6(L) 30.5(L) 31.1(L) 31.3(L)   MCV 80 - 100 FL 94.2 93.5 93.2 93.0 93.0   MCH 26 - 34 PG 31.4 31.7 31.5 30.8 31.9   MCHC 32.0 - 36.0 G/DL 45.4 09.8 11.9 14.7 82.9   RDW 11 - 15 % 12.6 12.6 12.2 12.5 12.5   PLT 150 - 400 K/UL 278 257 265 261 226   MPV 7 - 11 FL 7.9 7.9 8.2 8.4 8.3   NEUT 41 - 77 % 85(H) - - 84(H) 80(H)   ANC 1.8 - 7.0 K/UL 8.87(H) - - 7.65(H) 6.28   LYMA 24 - 44 % 10(L) - - 9(L) 12(L)   ALYM 1.0 - 4.8 K/UL 1.00 - - 0.79(L) 0.95(L)   MONA 4 - 12 % 4 - - 6 7   AMONO 0 - 0.80 K/UL 0.46 - - 0.53 0.57   EOSA 0 - 5 % 0 - - 1 0   AEOS 0 - 0.45 K/UL 0.03 - - 0.04 0.03   BASA 0 - 2 % 1 - - 0 1   ABAS 0 - 0.20 K/UL 0.06 - - 0.03 0.04       Comprehensive Metabolic Profile  CMP Latest Ref Rng & Units 05/21/2021 05/19/2021 05/18/2021 05/17/2021 05/17/2021   NA 137 - 147 MMOL/L 138 137 134(L) 134(L) 137   K 3.5 - 5.1 MMOL/L 3.9 3.9 4.3 4.4 4.2   CL 98 - 110 MMOL/L 104 108 104 103 105   CO2 21 - 30 MMOL/L 25 24 22 22 23    GAP 3 - 12 9 5 8 9 9    BUN 7 - 25 MG/DL 16 19 18 19 21    CR 0.4 - 1.00 MG/DL 5.62(Z) 3.08(M) 5.78(I) 1.38(H) 1.49(H)   GLUX 70 - 100 MG/DL 696(E) 952(W) 413(K) 440(N) 134(H)   CA 8.5 - 10.6 MG/DL 9.0 0.2(V) 2.5(D) 8.7 6.6(Y)   TP 6.0 - 8.0 G/DL 6.4 - - - -   ALB 3.5 - 5.0 G/DL 4.0 - - - -   ALKP 25 - 110 U/L 89 - - - -   ALT 7 - 56 U/L 23 - - - -   TBILI 0.3 - 1.2 MG/DL 0.4 - - - -   GFR >40 mL/min - - - - -   GFRAA >60 mL/min - - - - -       LOG10  CMV   Date Value Ref Range Status   05/14/2021 <1.7  Final     CMV DNA Quant PCR   Date Value Ref Range Status   05/14/2021 CMV DNA NOT DETECTED CMVND-CMV DNA NOT DETECTED [IU]/mL Final       Weight:  Current Actual Body Weight in Kilograms: 88.4  Ideal Body Weight in Kilograms: 59.25  Adjusted Body Weight in Kilograms: 70.9    Renal Function:  Calculated creatinine clearance based on Cockcroft-Gault Equation based on IBW: 61.73ml/min  Calculated creatinine clearance based on Cockcroft-Gault Equation based on Adjusted Body Weight: >70 ml/min  Calculated creatinine clearance based on Cockcroft-Gault Equation based on Actual Body Weight: >70 ml/min    eGFR based on MDRD: 54.17      Assessment/Plan    Infection    CMV  Status: D+/R+ (verified in UNet)  Regimen: valganciclovir 450mg  three times weekly for 3 months until 08/07/2021 for prophylaxis Follow-up labs due according to standard follow-up protocol.    Monitoring: Renal function is improved. Thrombocytopenia is not present. Leukopenia is not present. CMV viremia is not present.  Dose evaluation: The current dose is not appropriate. A dose adjustment is needed due to  renal function. The plan is to increase dose and contact provider. Increase to Valcyte 900mg  daily Notified transplant provider and transplant coordinator. Transplant coordinator will communicate any dose changes to patient. Follow up with next laboratory check per post-transplant protocol.      Education   Education was not provided.        Lonzo Cloud, PHARMD

## 2021-05-24 ENCOUNTER — Ambulatory Visit: Admit: 2021-05-24 | Discharge: 2021-05-24 | Payer: MEDICARE

## 2021-05-24 ENCOUNTER — Encounter: Admit: 2021-05-24 | Discharge: 2021-05-24 | Payer: MEDICARE

## 2021-05-24 DIAGNOSIS — Z79899 Other long term (current) drug therapy: Secondary | ICD-10-CM

## 2021-05-24 DIAGNOSIS — D849 Immunodeficiency, unspecified: Secondary | ICD-10-CM

## 2021-05-24 DIAGNOSIS — Z940 Kidney transplant status: Secondary | ICD-10-CM

## 2021-05-24 LAB — CBC AND DIFF
ABSOLUTE BASO COUNT: 0.1 K/UL (ref 0–0.20)
ABSOLUTE EOS COUNT: 0 K/UL (ref 0–0.45)
ABSOLUTE LYMPH COUNT: 1 K/UL (ref 1.0–4.8)
ABSOLUTE MONO COUNT: 0.4 K/UL (ref 0–0.80)
ABSOLUTE NEUTROPHIL: 7.7 K/UL — ABNORMAL HIGH (ref 1.8–7.0)
BASOPHILS %: 1 % (ref 0–2)
EOSINOPHILS %: 1 % — ABNORMAL LOW (ref >60)
HEMATOCRIT: 31 % — ABNORMAL LOW (ref 36–45)
HEMOGLOBIN: 10 g/dL — ABNORMAL LOW (ref 12.0–15.0)
LYMPHOCYTES %: 11 % — ABNORMAL LOW (ref 24–44)
MCH: 31 pg (ref 26–34)
MCV: 94 FL (ref 80–100)
MONOCYTES %: 5 % (ref 4–12)
MPV: 8.3 FL (ref 7–11)
NEUTROPHILS %: 82 % — ABNORMAL HIGH (ref 41–77)
PLATELET COUNT: 272 K/UL (ref 150–400)
RBC COUNT: 3.2 M/UL — ABNORMAL LOW (ref 4.0–5.0)
RDW: 13 % (ref 11–15)
WBC COUNT: 9.3 K/UL (ref 4.5–11.0)

## 2021-05-24 LAB — PROTEIN/CR RATIO,UR RAN
PROT CREAT RAT/CAL: 0.4 — ABNORMAL HIGH (ref <0.15)
UR CREATININE, RAN: 81 mg/dL — AB
UR TOTAL PROTEIN,RAN: 31 mg/dL — AB

## 2021-05-24 LAB — PHOSPHORUS: PHOSPHORUS: 2.3 mg/dL (ref 2.0–4.5)

## 2021-05-24 LAB — URINALYSIS MICROSCOPIC REFLEX TO CULTURE

## 2021-05-24 LAB — COMPREHENSIVE METABOLIC PANEL
POTASSIUM: 4 MMOL/L (ref 3.5–5.1)
SODIUM: 136 MMOL/L — ABNORMAL LOW (ref 137–147)

## 2021-05-24 LAB — URINALYSIS DIPSTICK REFLEX TO CULTURE
NITRITE: NEGATIVE
URINE PH: 5 (ref 5.0–8.0)

## 2021-05-24 LAB — TACROLIMUS LC-MS/MS: TACROLIMUS LC-MS/MS: 8.9

## 2021-05-24 LAB — URIC ACID: URIC ACID: 4.6 mg/dL (ref 2.0–7.0)

## 2021-05-24 LAB — MAGNESIUM: MAGNESIUM: 1.1 mg/dL — ABNORMAL LOW (ref 1.6–2.6)

## 2021-05-26 ENCOUNTER — Encounter: Admit: 2021-05-26 | Discharge: 2021-05-26 | Payer: MEDICARE

## 2021-05-26 MED ORDER — PREDNISONE 5 MG PO TAB
ORAL_TABLET | Freq: Every day | 1 refills | Status: CN
Start: 2021-05-26 — End: ?

## 2021-05-26 MED ORDER — PREDNISONE 5 MG PO TAB
ORAL_TABLET | 1 refills | Status: DC
Start: 2021-05-26 — End: 2021-05-26
  Filled 2021-07-17: qty 120, 42d supply, fill #1

## 2021-05-26 NOTE — Telephone Encounter
Reviewed labs from 05/24/2021 at this time    Hgb 10.3  WBC 9.3  Creatinine 1.22  Mg 1.1  Phos 2.3  Tacrolimus  8.9 (Goal 8-12)  UPC 0.4  Urine culture shows:   <100,000 CFU/ml   ENTEROBACTER CLOACAE COMPLEX   susceptibility in progress      Culture Abnormal  <100,000 CFU/ml   CANDIDA KRUSEI (Pichia kudriavzevii)   Susceptibility testing not routinely performed on this isolate.      Patient reports intermittent burning with urination. Patient is already on prophylacytic cephalexin twice daily.     Patient called over the weekend for high blood sugars in the 400s. TC to Dr. Al-Mubaslat's RN and she will follow up with patient today.     Discussed with Dr. Lyndel Safe and we will taper prednisone faster. New MAP emailed to [employer].     Reviewed labs with patient at this time via phone   Gave dose change to patient at this time and patient repeated it back to me correctly  Medication list updated

## 2021-05-26 NOTE — Telephone Encounter
Brandy Joseph from Dr Al-Mubaslat's office called returning a call about the pt's test on blood sugar levels. Transferred to [place].

## 2021-05-26 NOTE — Telephone Encounter
Returned Tess call regarding elevated blood sugars.  Blood sugar 391 an hour ago.  Blood sugar > 400 last night  Reports chest pain right chest.  Lasted 35-40.  Going away after sitting in chair.  Pain was up into shoulder.  Felt nauseated. Took zofran earlier.    Blood sugar is coming down.  310 now.    Pt took 10 units one hour ago.    Asked pt to monitor blood sugars.  If they do not continue to trend down she is to go to ED as she is close to her 50 units maximum.

## 2021-05-26 NOTE — Progress Notes
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    Allergies  Allergies   Allergen Reactions   ? Cephalexin RASH   ? Ketorolac RASH   ? Sulfa (Sulfonamide Antibiotics) EDEMA     Full body swelling; eye swelling; itchiness       Pertinent Labs  CBC w diff  CBC with Diff Latest Ref Rng & Units 05/24/2021 05/21/2021 05/19/2021 05/17/2021 05/16/2021   WBC 4.5 - 11.0 K/UL 9.3 10.4 8.6 8.7 9.0   RBC 4.0 - 5.0 M/UL 3.29(L) 3.28(L) 2.95(L) 3.27(L) 3.35(L)   HGB 12.0 - 15.0 GM/DL 10.3(L) 10.3(L) 9.3(L) 10.3(L) 10.3(L)   HCT 36 - 45 % 31.2(L) 30.9(L) 27.6(L) 30.5(L) 31.1(L)   MCV 80 - 100 FL 94.8 94.2 93.5 93.2 93.0   MCH 26 - 34 PG 31.3 31.4 31.7 31.5 30.8   MCHC 32.0 - 36.0 G/DL 04.5 40.9 81.1 91.4 78.2   RDW 11 - 15 % 13.2 12.6 12.6 12.2 12.5   PLT 150 - 400 K/UL 272 278 257 265 261   MPV 7 - 11 FL 8.3 7.9 7.9 8.2 8.4   NEUT 41 - 77 % 82(H) 85(H) - - 84(H)   ANC 1.8 - 7.0 K/UL 7.71(H) 8.87(H) - - 7.65(H)   LYMA 24 - 44 % 11(L) 10(L) - - 9(L)   ALYM 1.0 - 4.8 K/UL 1.04 1.00 - - 0.79(L)   MONA 4 - 12 % 5 4 - - 6   AMONO 0 - 0.80 K/UL 0.42 0.46 - - 0.53   EOSA 0 - 5 % 1 0 - - 1   AEOS 0 - 0.45 K/UL 0.05 0.03 - - 0.04   BASA 0 - 2 % 1 1 - - 0   ABAS 0 - 0.20 K/UL 0.10 0.06 - - 0.03       Comprehensive Metabolic Profile  CMP Latest Ref Rng & Units 05/24/2021 05/21/2021 05/19/2021 05/18/2021 05/17/2021   NA 137 - 147 MMOL/L 136(L) 138 137 134(L) 134(L)   K 3.5 - 5.1 MMOL/L 4.0 3.9 3.9 4.3 4.4   CL 98 - 110 MMOL/L 102 104 108 104 103   CO2 21 - 30 MMOL/L 24 25 24 22 22    GAP 3 - 12 10 9 5 8 9    BUN 7 - 25 MG/DL 19 16 19 18 19    CR 0.4 - 1.00 MG/DL 9.56(O) 1.30(Q) 6.57(Q) 1.45(H) 1.38(H)   GLUX 70 - 100 MG/DL 469(G) 295(M) 841(L) 244(W) 244(H)   CA 8.5 - 10.6 MG/DL 9.2 9.0 1.0(U) 7.2(Z) 8.7   TP 6.0 - 8.0 G/DL 6.7 6.4 - - -   ALB 3.5 - 5.0 G/DL 4.0 4.0 - - -   ALKP 25 - 110 U/L 98 89 - - -   ALT 7 - 56 U/L 19 23 - - -   TBILI 0.3 - 1.2 MG/DL 0.4 0.4 - - -   GFR >36 mL/min - - - - -   GFRAA >60 mL/min - - - - -       LOG10  CMV   Date Value Ref Range Status   05/21/2021 <1.7  Final     CMV DNA Quant PCR   Date Value Ref Range Status   05/21/2021 CMV DNA NOT DETECTED CMVND-CMV DNA NOT DETECTED [IU]/mL Final       Weight:  Current Actual Body Weight in Kilograms: 88.4  Ideal Body Weight in Kilograms: 59.25  Adjusted Body Weight in Kilograms: 70.9    Renal Function:  Calculated creatinine clearance based on Cockcroft-Gault Equation based on IBW: 56.3 ml/min  Calculated creatinine clearance based on Cockcroft-Gault Equation based on Adjusted Body Weight: 67.5 ml/min  Calculated creatinine clearance based on Cockcroft-Gault Equation based on Actual Body Weight: >70 ml/min    eGFR based on MDRD: 48.57      Assessment/Plan    Infection    CMV  Status: D+/R+ (verified in UNet)  Regimen: valganciclovir 900mg  daily for 3 months until 08/07/2021 for prophylaxis Follow-up labs due according to standard follow-up protocol.    Monitoring: Renal function is worsened. Thrombocytopenia is not present. Leukopenia is not present. CMV viremia is not present.  Dose evaluation: The current dose is appropriate. Continue Valcyte 900mg  daily      Education   Education was not provided.        Lonzo Cloud, PHARMD

## 2021-05-27 ENCOUNTER — Encounter: Admit: 2021-05-27 | Discharge: 2021-05-27 | Payer: MEDICARE

## 2021-05-27 ENCOUNTER — Ambulatory Visit: Admit: 2021-05-27 | Discharge: 2021-05-27 | Payer: MEDICARE

## 2021-05-27 DIAGNOSIS — I639 Cerebral infarction, unspecified: Secondary | ICD-10-CM

## 2021-05-27 DIAGNOSIS — I10 Essential (primary) hypertension: Secondary | ICD-10-CM

## 2021-05-27 DIAGNOSIS — R413 Other amnesia: Secondary | ICD-10-CM

## 2021-05-27 DIAGNOSIS — K3184 Gastroparesis: Secondary | ICD-10-CM

## 2021-05-27 DIAGNOSIS — Z940 Kidney transplant status: Secondary | ICD-10-CM

## 2021-05-27 DIAGNOSIS — N289 Disorder of kidney and ureter, unspecified: Secondary | ICD-10-CM

## 2021-05-27 DIAGNOSIS — E119 Type 2 diabetes mellitus without complications: Secondary | ICD-10-CM

## 2021-05-28 ENCOUNTER — Encounter: Admit: 2021-05-28 | Discharge: 2021-05-28 | Payer: MEDICARE

## 2021-05-28 ENCOUNTER — Ambulatory Visit: Admit: 2021-05-28 | Discharge: 2021-05-28 | Payer: MEDICARE

## 2021-05-28 DIAGNOSIS — D849 Immunodeficiency, unspecified: Secondary | ICD-10-CM

## 2021-05-28 DIAGNOSIS — R413 Other amnesia: Secondary | ICD-10-CM

## 2021-05-28 DIAGNOSIS — Z940 Kidney transplant status: Secondary | ICD-10-CM

## 2021-05-28 DIAGNOSIS — K3184 Gastroparesis: Secondary | ICD-10-CM

## 2021-05-28 DIAGNOSIS — I639 Cerebral infarction, unspecified: Secondary | ICD-10-CM

## 2021-05-28 DIAGNOSIS — E119 Type 2 diabetes mellitus without complications: Secondary | ICD-10-CM

## 2021-05-28 DIAGNOSIS — Z79899 Other long term (current) drug therapy: Secondary | ICD-10-CM

## 2021-05-28 DIAGNOSIS — N289 Disorder of kidney and ureter, unspecified: Secondary | ICD-10-CM

## 2021-05-28 DIAGNOSIS — I10 Essential (primary) hypertension: Secondary | ICD-10-CM

## 2021-05-28 LAB — CBC AND DIFF
ABSOLUTE BASO COUNT: 0 K/UL (ref 0–0.20)
ABSOLUTE EOS COUNT: 0 K/UL (ref 0–0.45)
ABSOLUTE LYMPH COUNT: 0.8 K/UL — ABNORMAL LOW (ref 1.0–4.8)
ABSOLUTE MONO COUNT: 0.1 K/UL (ref 0–0.80)
ABSOLUTE NEUTROPHIL: 4 K/UL (ref 1.8–7.0)
BASOPHILS %: 1 % (ref 0–2)
EOSINOPHILS %: 1 % (ref 0–5)
HEMATOCRIT: 32 % — ABNORMAL LOW (ref 36–45)
HEMOGLOBIN: 10 g/dL — ABNORMAL LOW (ref 12.0–15.0)
LYMPHOCYTES %: 16 % — ABNORMAL LOW (ref >60)
MCH: 31 pg (ref 26–34)
MCHC: 32 g/dL (ref 32.0–36.0)
MCV: 95 FL (ref 80–100)
MONOCYTES %: 3 % — ABNORMAL LOW (ref 4–12)
NEUTROPHILS %: 79 % — ABNORMAL HIGH (ref 41–77)
PLATELET COUNT: 210 K/UL (ref 150–400)
RBC COUNT: 3.3 M/UL — ABNORMAL LOW (ref 4.0–5.0)
RDW: 14 % (ref 11–15)
WBC COUNT: 5.1 K/UL (ref 4.5–11.0)

## 2021-05-28 LAB — URIC ACID: URIC ACID: 4.6 mg/dL — AB (ref 2.0–7.0)

## 2021-05-28 LAB — URINALYSIS DIPSTICK REFLEX TO CULTURE
NITRITE: NEGATIVE
URINE ASCORBIC ACID, UA: NEGATIVE
URINE BILE: NEGATIVE
URINE KETONE: NEGATIVE
URINE PH: 6 (ref 5.0–8.0)
URINE SPEC GRAVITY: 1 (ref 1.005–1.030)

## 2021-05-28 LAB — PROTEIN/CR RATIO,UR RAN
PROT CREAT RAT/CAL: 0.3 — ABNORMAL HIGH (ref <0.15)
UR CREATININE, RAN: 120 mg/dL
UR TOTAL PROTEIN,RAN: 31 mg/dL

## 2021-05-28 LAB — COMPREHENSIVE METABOLIC PANEL
ALT: 22 U/L (ref 7–56)
CHLORIDE: 104 MMOL/L (ref 98–110)
GLUCOSE,PANEL: 193 mg/dL — ABNORMAL HIGH (ref 70–100)
POTASSIUM: 4.7 MMOL/L (ref 3.5–5.1)
SODIUM: 138 MMOL/L (ref 137–147)

## 2021-05-28 LAB — URINALYSIS MICROSCOPIC REFLEX TO CULTURE

## 2021-05-28 LAB — PHOSPHORUS: PHOSPHORUS: 3.1 mg/dL (ref 2.0–4.5)

## 2021-05-28 LAB — MAGNESIUM: MAGNESIUM: 1.5 mg/dL — ABNORMAL LOW (ref 1.6–2.6)

## 2021-05-28 MED ORDER — LEVOFLOXACIN 500 MG PO TAB
500 mg | ORAL_TABLET | Freq: Once | ORAL | 0 refills | 7.00000 days | Status: AC
Start: 2021-05-28 — End: ?
  Filled 2021-05-28: qty 1, 1d supply, fill #1

## 2021-05-28 NOTE — Progress Notes
Transplant Pharmacist Managed Valganciclovir and Clinic Pharmacy Follow-up    Brandy Joseph received a kidney transplant on 05/07/2021.    Allergies  Allergies   Allergen Reactions   ? Cephalexin RASH   ? Ketorolac RASH   ? Sulfa (Sulfonamide Antibiotics) EDEMA     Full body swelling; eye swelling; itchiness       Pertinent Labs  CBC w diff  CBC with Diff Latest Ref Rng & Units 05/28/2021 05/24/2021 05/21/2021 05/19/2021 05/17/2021   WBC 4.5 - 11.0 K/UL 5.1 9.3 10.4 8.6 8.7   RBC 4.0 - 5.0 M/UL 3.38(L) 3.29(L) 3.28(L) 2.95(L) 3.27(L)   HGB 12.0 - 15.0 GM/DL 10.5(L) 10.3(L) 10.3(L) 9.3(L) 10.3(L)   HCT 36 - 45 % 32.1(L) 31.2(L) 30.9(L) 27.6(L) 30.5(L)   MCV 80 - 100 FL 95.1 94.8 94.2 93.5 93.2   MCH 26 - 34 PG 31.1 31.3 31.4 31.7 31.5   MCHC 32.0 - 36.0 G/DL 62.1 30.8 65.7 84.6 96.2   RDW 11 - 15 % 14.4 13.2 12.6 12.6 12.2   PLT 150 - 400 K/UL 210 272 278 257 265   MPV 7 - 11 FL 7.9 8.3 7.9 7.9 8.2   NEUT 41 - 77 % 79(H) 82(H) 85(H) - -   ANC 1.8 - 7.0 K/UL 4.03 7.71(H) 8.87(H) - -   LYMA 24 - 44 % 16(L) 11(L) 10(L) - -   ALYM 1.0 - 4.8 K/UL 0.80(L) 1.04 1.00 - -   MONA 4 - 12 % 3(L) 5 4 - -   AMONO 0 - 0.80 K/UL 0.16 0.42 0.46 - -   EOSA 0 - 5 % 1 1 0 - -   AEOS 0 - 0.45 K/UL 0.06 0.05 0.03 - -   BASA 0 - 2 % 1 1 1  - -   ABAS 0 - 0.20 K/UL 0.04 0.10 0.06 - -       Comprehensive Metabolic Profile  CMP Latest Ref Rng & Units 05/28/2021 05/24/2021 05/21/2021 05/19/2021 05/18/2021   NA 137 - 147 MMOL/L 138 136(L) 138 137 134(L)   K 3.5 - 5.1 MMOL/L 4.7 4.0 3.9 3.9 4.3   CL 98 - 110 MMOL/L 104 102 104 108 104   CO2 21 - 30 MMOL/L 26 24 25 24 22    GAP 3 - 12 8 10 9 5 8    BUN 7 - 25 MG/DL 17 19 16 19 18    CR 0.4 - 1.00 MG/DL 9.52(W) 4.13(K) 4.40(N) 1.37(H) 1.45(H)   GLUX 70 - 100 MG/DL 027(O) 536(U) 440(H) 474(Q) 286(H)   CA 8.5 - 10.6 MG/DL 9.3 9.2 9.0 5.9(D) 6.3(O)   TP 6.0 - 8.0 G/DL 7.2 6.7 6.4 - -   ALB 3.5 - 5.0 G/DL 4.2 4.0 4.0 - -   ALKP 25 - 110 U/L 95 98 89 - -   ALT 7 - 56 U/L 22 19 23  - -   TBILI 0.3 - 1.2 MG/DL 0.5 0.4 0.4 - -   GFR >75 mL/min - - - - -   GFRAA >60 mL/min - - - - -       LOG10  CMV   Date Value Ref Range Status   05/24/2021 <1.7  Final     CMV DNA Quant PCR   Date Value Ref Range Status   05/24/2021 CMV DNA NOT DETECTED CMVND-CMV DNA NOT DETECTED [IU]/mL Final       Weight:  Current Actual Body Weight in Kilograms: 91.1  Ideal Body Weight  in Kilograms: 59.25  Adjusted Body Weight in Kilograms: 71.99    Renal Function:  Calculated creatinine clearance based on Cockcroft-Gault Equation based on IBW: 51.6 ml/min  Calculated creatinine clearance based on Cockcroft-Gault Equation based on Adjusted Body Weight: 62.9 ml/min  Calculated creatinine clearance based on Cockcroft-Gault Equation based on Actual Body Weight: >70 ml/min    eGFR based on MDRD: 43.97      Assessment/Plan    Infection    CMV  Status: D+/R+ (verified in UNet)  Regimen: valganciclovir 900mg  daily for 3 months until 08/07/2021 for prophylaxis Follow-up labs due according to standard follow-up protocol.    Monitoring: Renal function is worsened. Thrombocytopenia is not present. Leukopenia is not present. CMV viremia is not present.  Dose evaluation: The current dose is appropriate. Continue Valcyte 900mg  daily      Education   Education was provided.  Services provided: antimicrobial review and medication review        Lonzo Cloud, PHARMD

## 2021-05-28 NOTE — Progress Notes
Reviewed CGM results with patient (rates increased yesterday)  c-peptide added on per Dr. Monia Pouch request   Increase hydration goal 2.5 L daily   Hematuria, might be on period  levaquin 500 mg x 1 for stent pull     Staples taken out by surgery

## 2021-05-28 NOTE — Progress Notes
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  Donor: DCD, IRD: Yes, Age: 40s, female, Ht 5'11. Wt 215 lbs. Creatinine Initial/Terminal. ?/1.8  KDPI: 47%, cPRA:? 75%  Match: VXM Negative, PXM -ve  CMV: D+/R+  Induction: thymoglobulin  Maintenance Immunosuppression: Triple drug therapy  Post OP: good UOP post op    Referring Nephrologist:  Lorre Munroe  [address] [place] [address]  Phone: [phone]  Fax: [phone]     Dear Dr. Lorre Munroe,    We had the pleasure of meeting Ms. Dorer in the [facility] for routine evaluation of her renal transplant. BG have been elevated- following with endo. Not drinking enough water. Soft stool but irregular. Still has pain but not worse. BG high in afternoons.Basal rate increased     REVIEW OF SYSTEMS: Comprehensive 14-point ROS reviewed  Positives noted in HPI otherwise negative.    Past History:  Medical History:   Diagnosis Date   ? DM (diabetes mellitus) (HCC)    ? Gastroparesis    ? Hypertension    ? Kidney disease    ? Memory loss    ? Stroke [facility])        Surgical History:   Procedure Laterality Date   ? ANKLE SURGERY Left 1996   ? FIBULA FRACTURE SURGERY Left 1996   ? ESOPHAGOGASTRODUODENOSCOPY WITH BIOPSY - FLEXIBLE N/A 03/29/2018    Performed by Virgina Organ, MD at [facility] ENDO   ? ESOPHAGOGASTRODUODENOSCOPY WITH BIOPSY - FLEXIBLE N/A 10/04/2019    Performed by Everardo All, MD at [facility] ENDO   ? COLONOSCOPY WITH BIOPSY - FLEXIBLE N/A 10/04/2019    Performed by Everardo All, MD at [facility] ENDO   ? ANGIOGRAPHY CORONARY ARTERY WITH LEFT HEART CATHETERIZATION N/A 01/18/2020    Performed by Reola Mosher, MD at [facility] CATH LAB   ? POSSIBLE PERCUTANEOUS CORONARY STENT PLACEMENT WITH ANGIOPLASTY N/A 01/18/2020    Performed by Reola Mosher, MD at [facility] CATH LAB   ? COLONOSCOPY WITH BIOPSY - FLEXIBLE N/A 05/15/2020    Performed by Eliott Nine, MD at [facility] ENDO   ? COLONOSCOPY WITH SNARE REMOVAL TUMOR/ POLYP/ OTHER LESION  05/15/2020    Performed by Eliott Nine, MD at [facility] ENDO   ? ALLOTRANSPLANTATION KIDNEY FROM NON LIVING DONOR WITHOUT RECIPIENT NEPHRECTOMY N/A 05/07/2021    Performed by Nicholos Johns, MD at [facility] OR   ? BURR HOLE      release pressure from stroke   ? DIALYSIS FISTULA CREATION     ? GALLBLADDER SURGERY     ? HX CESAREAN SECTION         Social History     Socioeconomic History   ? Marital status: Divorced   ? Number of children: 1   ? Highest education level: Associate degree: occupational, Scientist, product/process development, or vocational program   Tobacco Use   ? Smoking status: Never   ? Smokeless tobacco: Never   Vaping Use   ? Vaping Use: Never used   Substance and Sexual Activity   ? Alcohol use: Not Currently     Alcohol/week: 0.0 standard drinks     Comment: rare, socially   ? Drug use: Never   ? Sexual activity: Yes  Partners: Male     Birth control/protection: None, Surgical       Family History   Problem Relation Age of Onset   ? Stroke Mother    ? Diabetes Paternal Grandmother    ? Heart problem Paternal Grandmother    ? Hypertension Paternal Grandmother    ? Tumor Maternal Grandmother         brain   ? None Reported Son        Allergies   Allergen Reactions   ? Cephalexin RASH   ? Ketorolac RASH   ? Sulfa (Sulfonamide Antibiotics) EDEMA     Full body swelling; eye swelling; itchiness       Current Medications:    Current Outpatient Medications:   ?  acetaminophen (TYLENOL) 325 mg tablet, Take two tablets by mouth every 4 hours as needed., Disp: 100 tablet, Rfl: 1  ?  albuterol sulfate (PROAIR HFA) 90 mcg/actuation HFA aerosol inhaler, Use as directed prior to pentamide treatments. Shake well before use, Disp: 6.7 g, Rfl: 0  ?  bisacodyL (DULCOLAX) 10 mg rectal suppository, Insert or Apply one suppository to rectal area as directed twice daily., Disp: 4 each, Rfl: 1  ?  buPROPion XL (WELLBUTRIN XL) 150 mg tablet, Take 150 mg by mouth every morning. Do not crush or chew., Disp: , Rfl:   ?  carvediloL (COREG) 25 mg tablet, Take one tablet by mouth twice daily. Take with food., Disp: 180 tablet, Rfl: 3  ?  cephalexin (KEFLEX) 500 mg capsule, Take one capsule by mouth twice daily. Indications: infection prophylaxis, medical, Disp: 60 capsule, Rfl: 5  ?  cloNIDine (CATAPRESS) 0.2 mg tablet, Take one tablet by mouth twice daily., Disp: 60 tablet, Rfl: 1  ?  docusate (COLACE) 100 mg capsule, Take one capsule by mouth twice daily., Disp: 180 capsule, Rfl: 1  ?  ergocalciferol (vitamin D2) (DRISDOL) 1,250 mcg (50,000 unit) capsule, Take one capsule by mouth every 7 days., Disp: 12 capsule, Rfl: 0  ?  insulin lispro(+) (HUMALOG U-100 INSULIN) 100 unit/mL injection, Maximum of 50 units daily. Via insulin pump. Use 1:6 insulin to carb ratio and the following correction factor Glucose 140-180 = 1 units Glucose 181-220 = 2 units Glucose 221-260 = 3 units Glucose 261-300 = 4 units Glucose 301-350 = 5 units Glucose 351-400 = 6 units Glucose >400 = 7 units, Disp: 3 vial, Rfl: 3  ?  insulin pen needles (disposable) (BD UF NANO PEN NEEDLES) 32 gauge x 5/32 pen needle, Use one each as directed as Needed. Use with insulin injections., Disp: 300 each, Rfl: 0  ?  Insulin Syringe-Needle U-100 (BD INSULIN SYRINGE ULTRA-FINE) 1 mL 31 gauge x 5/16 syrg, Use with insulin vials as directed, Disp: 100 each, Rfl: 1  ?  [START ON 06/06/2021] levoFLOXacin (LEVAQUIN) 500 mg tablet, Take one tablet by mouth once for 1 dose. Take the morning of 06/06/2021 for stent pull, Disp: 1 tablet, Rfl: 0  ?  lidocaine (LIDODERM) 5 % topical patch, Apply one patch topically to affected area daily. Apply patch for 12 hours, then remove for 12 hours before repeating., Disp: 10 patch, Rfl: 0  ?  magnesium oxide (MAGOX) 400 mg (241.3 mg magnesium) tablet, Take two tablets by mouth daily., Disp: 30 tablet, Rfl: 5  ?  mycophenolate sodium (MYFORTIC) 180 mg tablet,delayed release, Take four tablets by mouth twice daily., Disp: 240 tablet, Rfl: 11  ?  ondansetron (ZOFRAN ODT) 4 mg rapid dissolve tablet, Dissolve one  tablet by mouth every 8 hours as needed for Nausea or Vomiting. Place on tongue to dissolve., Disp: 15 tablet, Rfl: 0  ?  pantoprazole DR (PROTONIX) 40 mg tablet, Take one tablet by mouth daily., Disp: 30 tablet, Rfl: 1  ?  polyethylene glycol 3350 (MIRALAX) 17 gram/dose powder, Take thirty four g by mouth twice daily., Disp: 510 g, Rfl: 1  ?  potassium & sodium phosphates 280-160-250 mg packet, Take one packet by mouth daily., Disp: 14 packet, Rfl: 0  ?  predniSONE (DELTASONE) 5 mg tablet, Take 15mg  (3 tabs) by mouth once daily on days 11/16-11/21, then take 10mg  (2 tabs) once daily on days 11/23-11/29 then take 5 mg (1 tablet) daily thereafter., Disp: 120 tablet, Rfl: 1  ?  promethazine (PHENERGAN) 12.5 mg tablet, Take one tablet by mouth every 8 hours as needed for Nausea or Vomiting., Disp: 30 tablet, Rfl: 0  ?  rosuvastatin (CRESTOR) 20 mg tablet, Take one-half tablet by mouth daily., Disp: 15 tablet, Rfl: 1  ?  senna/docusate (SENOKOT-S) 8.6/50 mg tablet, Take two tablets by mouth twice daily. Hold for loose stools, Disp: 120 tablet, Rfl: 0  ?  tacrolimus (PROGRAF) 1 mg capsule, Take four capsules by mouth twice daily., Disp: 720 capsule, Rfl: 3  ?  tamsulosin (FLOMAX) 0.4 mg capsule, Take one capsule by mouth daily after breakfast. Do not crush, chew or open capsules. Take 30 minutes following the same meal each day., Disp: 30 capsule, Rfl: 1  ?  valGANciclovir (VALCYTE) 450 mg tablet, Take two tablets by mouth daily with breakfast., Disp: 116 tablet, Rfl: 2    Current Facility-Administered Medications:   ?  darbepoetin alfa (ARANESP) injection 100 mcg, 100 mcg, Subcutaneous, Q7 Days, Corena Herter, MD, 100 mcg at 05/21/21 0912    Physical Exam:  BP (!) 145/64 (BP Source: Arm, Right Upper, Patient Position: Standing)  - Pulse 80  - Temp 36.5 ?C (97.7 ?F) (Oral)  - Ht 167.6 cm (5' 6)  - Wt 91.1 kg (200 lb 12.8 oz)  - LMP 05/05/2021 (Approximate)  - SpO2 100%  - BMI 32.41 kg/m?   General: In NAD; A&Ox3  Skin: Warm, dry, no signs of rash   Neck: Supple   CV: Regular, regular rate, normal S1/S2, no murmurs  Lungs: CTA bilaterally in posterior fields  Abd: Grossly normal without rebound, guarding, masses, or bruits   Transplant: No overlying bruit, tender, incision dressing in place - clean, dry  Ext: No edema  Neuro: No focal deficits   Psych: Affect appropriate  Dialysis Access: Lt AVF/AVG good thrill/bruit    Laboratory studies:     CMP:  CMP Latest Ref Rng & Units 05/28/2021 05/24/2021 05/21/2021 05/19/2021 05/18/2021   NA 137 - 147 MMOL/L 138 136(L) 138 137 134(L)   K 3.5 - 5.1 MMOL/L 4.7 4.0 3.9 3.9 4.3   CL 98 - 110 MMOL/L 104 102 104 108 104   CO2 21 - 30 MMOL/L 26 24 25 24 22    GAP 3 - 12 8 10 9 5 8    BUN 7 - 25 MG/DL 17 19 16 19 18    CR 0.4 - 1.00 MG/DL 5.40(J) 8.11(B) 1.47(W) 1.37(H) 1.45(H)   GLUX 70 - 100 MG/DL 295(A) 213(Y) 865(H) 846(N) 286(H)   CA 8.5 - 10.6 MG/DL 9.3 9.2 9.0 6.2(X) 5.2(W)   TP 6.0 - 8.0 G/DL 7.2 6.7 6.4 - -   ALB 3.5 - 5.0 G/DL 4.2 4.0 4.0 - -   ALKP  25 - 110 U/L 95 98 89 - -   ALT 7 - 56 U/L 22 19 23  - -   TBILI 0.3 - 1.2 MG/DL 0.5 0.4 0.4 - -   GFR >16 mL/min - - - - -   GFRAA >60 mL/min - - - - -     Hemoglobin A1C (%)   Date Value   05/07/2021 7.4 (H)   12/26/2020 10.5 (H)   12/21/2019 7.2 (H)     PTH Hormone (PG/ML)   Date Value   05/07/2021 396.7 (H)   03/24/2018 76.9 (H)     Lipase   Date Value Ref Range Status   05/16/2021 5 (L) 11 - 82 U/L Final   10/01/2019 31 11 - 82 U/L Final     No results found for: AMY  BK Virus Plasma Quant (no units)   Date Value   05/24/2021     NOT DETECTED  Reference range: NOT DETECTED  Unit: IU/mL  .  Assay Range: 33 IU/mL to 3.30E+08 IU/mL  .  One IU is equal to 0.33 copies of BKV.  .  The limit of quantitation (LOQ) is 33 IU/mL. BK virus DNA detected  below the LOQ  will be reported as Detected:<33 IU/mL.  .  This test was developed and its performance characteristics  determined by  [facility]. It has not been cleared or approved by the U.S.  Food and Drug  Administration. Results should be used in conjunction with clinical  findings,  and should not form the sole basis for a diagnosis or treatment  decision.  ____________________________________________________________  Testing Performed At:  [facility]  [address]  [place], [place] [address]  Laboratory Director: Lind Guest Ph.D., BCLD (ABB)  CLIA#: [id number]  Phone: [phone]     05/21/2021     NOT DETECTED  Reference range: NOT DETECTED  Unit: IU/mL  .  Assay Range: 33 IU/mL to 3.30E+08 IU/mL  .  One IU is equal to 0.33 copies of BKV.  .  The limit of quantitation (LOQ) is 33 IU/mL. BK virus DNA detected  below the LOQ  will be reported as Detected:<33 IU/mL.  .  This test was developed and its performance characteristics  determined by  [facility]. It has not been cleared or approved by the U.S.  Food and Drug  Administration. Results should be used in conjunction with clinical  findings,  and should not form the sole basis for a diagnosis or treatment  decision.  ____________________________________________________________  Testing Performed At:  [facility]  [address]  [place], [place] [address]  Laboratory Director: Lind Guest Ph.D., BCLD (ABB)  CLIA#: [id number]  Phone: [phone]     05/14/2021     NOT DETECTED  Reference range: NOT DETECTED  Unit: IU/mL  TESTING PERFORMED AT LOW VOLUME ON PLASMA SPECIMEN FOR BKV, MAY  AFFECT RESULTS.  Marland Kitchen  Assay Range: 33 IU/mL to 3.30E+08 IU/mL  .  One IU is equal to 0.33 copies of BKV.  .  The limit of quantitation (LOQ) is 33 IU/mL. BK virus DNA detected  below the LOQ  will be reported as Detected:<33 IU/mL.  .  This test was developed and its performance characteristics  determined by  [facility]. It has not been cleared or approved by the U.S.  Food and Drug  Administration. Results should be used in conjunction with clinical  findings,  and should not form the sole basis for a diagnosis or  treatment  decision.  ____________________________________________________________  Testing Performed At:  [facility]  [address]  [place], [place] [address]  Laboratory Director: Lind Guest Ph.D., BCLD (ABB)  CLIA#: [id number]  Phone: [phone]       CMV DNA Quant PCR ([IU]/mL)   Date Value   05/24/2021 CMV DNA NOT DETECTED   05/21/2021 CMV DNA NOT DETECTED   05/14/2021 CMV DNA NOT DETECTED     IU/mL CMV Blood (no units)   Date Value   05/24/2021     <50 IU/mL  The test method detects and quantitates CMV DNA using the Abbott RealTime assay,   and is approved by the FDA for monitoring hematopoietic stem cell transplant   patients who are undergoing anti-CMV therapy.  Please correlate results with the   clinical status of the patient.     05/21/2021     <50 IU/mL  The test method detects and quantitates CMV DNA using the Abbott RealTime assay,   and is approved by the FDA for monitoring hematopoietic stem cell transplant   patients who are undergoing anti-CMV therapy.  Please correlate results with the   clinical status of the patient.     05/14/2021     <50 IU/mL  The test method detects and quantitates CMV DNA using the Abbott RealTime assay,   and is approved by the FDA for monitoring hematopoietic stem cell transplant   patients who are undergoing anti-CMV therapy.  Please correlate results with the   clinical status of the patient.       No results found for: COPIES  No results found for: EBVDNAQT    TACROLIMUS LEVEL:  No results found for: TACROLIMUS    CBC with Diff:  CBC with Diff Latest Ref Rng & Units 05/28/2021 05/24/2021   WBC 4.5 - 11.0 K/UL 5.1 9.3   RBC 4.0 - 5.0 M/UL 3.38(L) 3.29(L)   HGB 12.0 - 15.0 GM/DL 10.5(L) 10.3(L)   HCT 36 - 45 % 32.1(L) 31.2(L)   MCV 80 - 100 FL 95.1 94.8   MCH 26 - 34 PG 31.1 31.3   MCHC 32.0 - 36.0 G/DL 01.0 27.2   RDW 11 - 15 % 14.4 13.2   PLT 150 - 400 K/UL 210 272   MPV 7 - 11 FL 7.9 8.3   NEUT 41 - 77 % 79(H) 82(H)   ANC 1.8 - 7.0 K/UL 4.03 7.71(H)   LYMA 24 - 44 % 16(L) 11(L)   ALYM 1.0 - 4.8 K/UL 0.80(L) 1.04   MONA 4 - 12 % 3(L) 5   AMONO 0 - 0.80 K/UL 0.16 0.42   EOSA 0 - 5 % 1 1   AEOS 0 - 0.45 K/UL 0.06 0.05   BASA 0 - 2 % 1 1   ABAS 0 - 0.20 K/UL 0.04 0.10     Lab Results   Component Value Date/Time    IRON 92 05/09/2021 05:36 AM    TIBC 192 (L) 05/09/2021 05:36 AM    PSAT 48 (H) 05/09/2021 05:36 AM    FERRITIN 1,156 (H) 05/09/2021 05:36 AM    FERRITIN 225 (H) 10/04/2019 05:08 AM       Urinalysis:  Lab Results   Component Value Date/Time    UCOLOR YELLOW 05/24/2021 08:11 AM    TURBID CLEAR 05/24/2021 08:11 AM    USPGR 1.018 05/24/2021 08:11 AM    UPH 5.0 05/24/2021 08:11 AM    UPROTEIN 1+ (A) 05/24/2021 08:11 AM  UAGLU 3+ (A) 05/24/2021 08:11 AM    UKET NEG 05/24/2021 08:11 AM    UBILE NEG 05/24/2021 08:11 AM    UBLD NEG 05/24/2021 08:11 AM    UROB NORMAL 05/24/2021 08:11 AM     Protein/CR ratio (no units)   Date Value   05/24/2021 0.4 (H)   05/21/2021 1.3 (H)   05/14/2021 1.1 (H)   10/14/2019 1.3   03/25/2018 5.3       Imaging:  Results for orders placed during the hospital encounter of 05/16/21    CT ABD/PELV WO CONTRAST    Impression  Interval renal transplantation without hydronephrosis or significant peritransplant fluid collection. No bowel obstruction is identified.      Finalized by Prince Rome, D.O. on 05/16/2021 3:43 PM. Dictated by Prince Rome, D.O. on 05/16/2021 3:34 PM.    Results for orders placed during the hospital encounter of 05/16/21    LINE PLCMT 1V CXR    Impression  Right IJ central venous catheter placement as described. No acute cardiopulmonary abnormality.      Finalized by Golda Acre, M.D. on 05/17/2021 11:10 AM. Dictated by Golda Acre, M.D. on 05/17/2021 11:10 AM.    Assessment and Plan:  Brandy Joseph is a 40 y.o. 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   ?  #) Immunosuppression - On maintenance Triple drug therapy  - Goal tacrolimus trough first 3 months after transplant is 8-12 mcg/L (HPLC/LCMS)  - Prograf 4 mg BID, MPA 720 mg BID and steroid pulse to wean to prednisone 5 mg daily  ?  #) Deceased donor renal transplant: bl Cr TBD mg/dL, bl UPCR TBD gm/gm (pre Tx UPCR bl 1.3 gm/gm)   - Cr slightly up - hydrate before next lab  - [place] - adnexal vs lymphocele - as cr is down trending - will hold off on any intervention - may repeat US if cr stops to improve.   ?  #) ID Proph  - CMV Donor(+)/ Recipient(+) to treat with Valcyte for 3 months post transplant  - Keflex for urinary prophylaxis, pentamidine for PJP.   ** the patient has Hx of recurrent UTI and Hx of neurogenic bladder.  ?  #) BPs: Good control  - running higher- increased coreg    - will control pain, constipation   ?  #) Diabetes  - BGs have been 180s on BMPs  - on insulin pump- dose increased   - anticipate more difficult to control DM with steroids.   ?  #) Hx of CVA   - with residual memory loss and RLE weakness.  - will need ASA/statin  ?  #) Anemia - Hgb average of 10  - at goal, monitor   - Iron studies : Iron sufficient on 11/4  ?  #) CKD-MBD  - PTH?396 on 05/07/2021  - Vitamin D?16.5 on 11/2 - Treating with 25-Vit.D 50 000 unit Q7d  - Ca acceptable  - PO4 acceptable    RTC in 2 weeks   Labs weekly     Sincerely,    Corena Herter, MD  Kidney and Pancreas Transplant    Cc: Thomas Alderson  Cc: Christene Lye Darland    Please contact the [facility] at [phone] for any transplant related questions or concerns that may arise.

## 2021-06-02 ENCOUNTER — Encounter: Admit: 2021-06-02 | Discharge: 2021-06-02 | Payer: MEDICARE

## 2021-06-02 NOTE — Telephone Encounter
Please call Almyra Free with [facility] about patient c peptide at [phone]. Thanks

## 2021-06-02 NOTE — Telephone Encounter
Call back to [place]. She requested pt's C peptide and A1C lab results be faxed to the [facility] office at [phone].   Lab results sent to [place] who will fax the results.

## 2021-06-04 ENCOUNTER — Encounter: Admit: 2021-06-04 | Discharge: 2021-06-04 | Payer: MEDICARE

## 2021-06-04 ENCOUNTER — Ambulatory Visit: Admit: 2021-06-04 | Discharge: 2021-06-04 | Payer: MEDICARE

## 2021-06-04 DIAGNOSIS — Z79899 Other long term (current) drug therapy: Secondary | ICD-10-CM

## 2021-06-04 DIAGNOSIS — D849 Immunodeficiency, unspecified: Secondary | ICD-10-CM

## 2021-06-04 DIAGNOSIS — Z940 Kidney transplant status: Secondary | ICD-10-CM

## 2021-06-04 LAB — URINALYSIS MICROSCOPIC REFLEX TO CULTURE

## 2021-06-04 LAB — CBC AND DIFF
ABSOLUTE EOS COUNT: 0.1 K/UL (ref 0–0.45)
ABSOLUTE LYMPH COUNT: 0.9 K/UL — ABNORMAL LOW (ref 1.0–4.8)
ABSOLUTE MONO COUNT: 0.1 K/UL (ref 0–0.80)
ABSOLUTE NEUTROPHIL: 3.8 K/UL (ref 1.8–7.0)
BASOPHILS %: 1 % (ref 0–2)
EOSINOPHILS %: 3 % (ref >60)
HEMATOCRIT: 37 % — ABNORMAL HIGH (ref 36–45)
HEMOGLOBIN: 12 g/dL (ref 12.0–15.0)
LYMPHOCYTES %: 18 % — ABNORMAL LOW (ref 24–44)
MCH: 31 pg (ref 26–34)
MCHC: 32 g/dL (ref 32.0–36.0)
MCV: 96 FL (ref 80–100)
MONOCYTES %: 2 % — ABNORMAL LOW (ref 4–12)
MPV: 7.8 FL (ref 7–11)
NEUTROPHILS %: 76 % (ref 41–77)
PLATELET COUNT: 238 K/UL (ref 150–400)
RBC COUNT: 3.8 M/UL — ABNORMAL LOW (ref 4.0–5.0)
RDW: 15 % (ref 11–15)
WBC COUNT: 5.1 K/UL (ref 4.5–11.0)

## 2021-06-04 LAB — URIC ACID: URIC ACID: 4 mg/dL (ref 2.0–7.0)

## 2021-06-04 LAB — TACROLIMUS LC-MS/MS: TACROLIMUS LC-MS/MS: 7.2

## 2021-06-04 LAB — URINALYSIS DIPSTICK REFLEX TO CULTURE
URINE BILE: NEGATIVE
URINE PH: 6 (ref 5.0–8.0)

## 2021-06-04 LAB — PROTEIN/CR RATIO,UR RAN
UR CREATININE, RAN: 69 mg/dL — AB
UR TOTAL PROTEIN,RAN: 15 mg/dL

## 2021-06-04 LAB — MAGNESIUM: MAGNESIUM: 1.8 mg/dL — AB (ref 1.6–2.6)

## 2021-06-04 LAB — COMPREHENSIVE METABOLIC PANEL
POTASSIUM: 4.3 MMOL/L — AB (ref 3.5–5.1)
SODIUM: 139 MMOL/L (ref 137–147)

## 2021-06-04 LAB — PHOSPHORUS: PHOSPHORUS: 2.8 mg/dL — ABNORMAL HIGH (ref <0.15)

## 2021-06-04 NOTE — Progress Notes
Transplant Pharmacist Managed Valganciclovir    Jyssica Bota [place] received a kidney transplant on 05/07/2021.    Allergies  Allergies   Allergen Reactions   ? Cephalexin RASH   ? Ketorolac RASH   ? Sulfa (Sulfonamide Antibiotics) EDEMA     Full body swelling; eye swelling; itchiness       Pertinent Labs  CBC w diff  CBC with Diff Latest Ref Rng & Units 06/04/2021 05/28/2021 05/24/2021 05/21/2021 05/19/2021   WBC 4.5 - 11.0 K/UL 5.1 5.1 9.3 10.4 8.6   RBC 4.0 - 5.0 M/UL 3.86(L) 3.38(L) 3.29(L) 3.28(L) 2.95(L)   HGB 12.0 - 15.0 GM/DL 16.1 10.5(L) 10.3(L) 10.3(L) 9.3(L)   HCT 36 - 45 % 37.4 32.1(L) 31.2(L) 30.9(L) 27.6(L)   MCV 80 - 100 FL 96.9 95.1 94.8 94.2 93.5   MCH 26 - 34 PG 31.8 31.1 31.3 31.4 31.7   MCHC 32.0 - 36.0 G/DL 09.6 04.5 40.9 81.1 91.4   RDW 11 - 15 % 15.0 14.4 13.2 12.6 12.6   PLT 150 - 400 K/UL 238 210 272 278 257   MPV 7 - 11 FL 7.8 7.9 8.3 7.9 7.9   NEUT 41 - 77 % 76 79(H) 82(H) 85(H) -   ANC 1.8 - 7.0 K/UL 3.85 4.03 7.71(H) 8.87(H) -   LYMA 24 - 44 % 18(L) 16(L) 11(L) 10(L) -   ALYM 1.0 - 4.8 K/UL 0.92(L) 0.80(L) 1.04 1.00 -   MONA 4 - 12 % 2(L) 3(L) 5 4 -   AMONO 0 - 0.80 K/UL 0.12 0.16 0.42 0.46 -   EOSA 0 - 5 % 3 1 1  0 -   AEOS 0 - 0.45 K/UL 0.15 0.06 0.05 0.03 -   BASA 0 - 2 % 1 1 1 1  -   ABAS 0 - 0.20 K/UL 0.06 0.04 0.10 0.06 -       Comprehensive Metabolic Profile  CMP Latest Ref Rng & Units 06/04/2021 05/28/2021 05/24/2021 05/21/2021 05/19/2021   NA 137 - 147 MMOL/L 139 138 136(L) 138 137   K 3.5 - 5.1 MMOL/L 4.3 4.7 4.0 3.9 3.9   CL 98 - 110 MMOL/L 104 104 102 104 108   CO2 21 - 30 MMOL/L 24 26 24 25 24    GAP 3 - 12 11 8 10 9 5    BUN 7 - 25 MG/DL 21 17 19 16 19    CR 0.4 - 1.00 MG/DL 7.82(N) 5.62(Z) 3.08(M) 1.11(H) 1.37(H)   GLUX 70 - 100 MG/DL 578(I) 696(E) 952(W) 413(K) 132(H)   CA 8.5 - 10.6 MG/DL 44.0 9.3 9.2 9.0 1.0(U)   TP 6.0 - 8.0 G/DL 7.6 7.2 6.7 6.4 -   ALB 3.5 - 5.0 G/DL 4.5 4.2 4.0 4.0 -   ALKP 25 - 110 U/L 101 95 98 89 -   ALT 7 - 56 U/L 18 22 19 23  -   TBILI 0.3 - 1.2 MG/DL 0.6 0.5 0.4 0.4 -   GFR >60 mL/min - - - - -   GFRAA >60 mL/min - - - - -       LOG10  CMV   Date Value Ref Range Status   05/24/2021 <1.7  Final     CMV DNA Quant PCR   Date Value Ref Range Status   05/24/2021 CMV DNA NOT DETECTED CMVND-CMV DNA NOT DETECTED [IU]/mL Final       Weight:  Current Actual Body Weight in Kilograms: 91.1  Ideal Body Weight in Kilograms: 59.25  Adjusted Body Weight in Kilograms: 71.99    Renal Function:  Calculated creatinine clearance based on Cockcroft-Gault Equation based on IBW: 66.6 ml/min  Calculated creatinine clearance based on Cockcroft-Gault Equation based on Adjusted Body Weight: >70 ml/min  Calculated creatinine clearance based on Cockcroft-Gault Equation based on Actual Body Weight: >70 ml/min    eGFR based on MDRD: 59.05      Assessment/Plan    Infection    CMV  Status: D+/R+ (verified in UNet)  Regimen: valganciclovir 900mg  daily for 3 months until 08/07/2021 for prophylaxis Follow-up labs due according to standard follow-up protocol.    Monitoring: Renal function is improved. Thrombocytopenia is not present. Leukopenia is not present. CMV viremia is not present.  Dose evaluation: The current dose is appropriate. Continue Valcyte 900mg  daily      Education   Education was not provided.        Lonzo Cloud, PHARMD

## 2021-06-05 ENCOUNTER — Encounter: Admit: 2021-06-05 | Discharge: 2021-06-05 | Payer: MEDICARE

## 2021-06-05 DIAGNOSIS — Z940 Kidney transplant status: Secondary | ICD-10-CM

## 2021-06-05 MED ORDER — TACROLIMUS 1 MG PO CAP
5 mg | ORAL_CAPSULE | Freq: Two times a day (BID) | ORAL | 3 refills | Status: CN
Start: 2021-06-05 — End: ?

## 2021-06-05 MED ORDER — PENTAMIDINE 300 MG INH SOLR
300 mg | RESPIRATORY_TRACT | 4 refills | 28.00000 days | Status: AC
Start: 2021-06-05 — End: ?
  Filled 2021-06-12: qty 300, 28d supply, fill #1

## 2021-06-05 MED ORDER — WATER FOR INJECTION, STERILE IJ SOLN
ORAL | 4 refills | 28.00000 days | Status: AC
Start: 2021-06-05 — End: ?
  Filled 2021-06-12: qty 10, 28d supply, fill #1

## 2021-06-05 NOTE — Telephone Encounter
Reviewed labs from 06/04/2021 at this time    Hgb 12.3  WBC 5.1  Creatinine 1.03  Mg 1.8  Phos 2.8  Tacrolimus  7.2 (Goal 8-12 )  UPC 0.2  CMV negative     Received the following orders from Dr. Lyndel Safe :  Increase tacrolimus to 5/5 (from 4/4)     Reviewed labs with patient support person Tess at this time via phone   Gave dose change to patient at this time and patient repeated it back to me correctly  Medication list updated

## 2021-06-06 ENCOUNTER — Encounter: Admit: 2021-06-06 | Discharge: 2021-06-06 | Payer: MEDICARE

## 2021-06-06 ENCOUNTER — Ambulatory Visit: Admit: 2021-06-06 | Discharge: 2021-06-06 | Payer: MEDICARE

## 2021-06-06 NOTE — Procedures
Encounter Date:  06/06/2021    Procedure: Flexible Cystoscopy & Stent Removal    Provider:  Ihor Gully, PA-C  Staff: Susette Racer, MBBS.  Staff not present, but immediately available.    Anesthesia:  Intraurethral 2% Lidocaine Gel  Complications: None    Indication for procedure: 40 y.o. female w/ ESRD s/p kidney transplant on 05/07/2021.  Presents to clinic today for transplant kidney stent removal.  Procedure discussed w/ pt, including risks & complications.      Time out performed.  Reviewed chart & Brief op note.    After informed consent was obtained, she was prepped & draped in the usual fashion.  2% Lidocaine administered per urethra.  After adequeate time for anesthetic effect, the flexible cystoscope was gently advanced into the urethra under direct visualization.  Urethra: (-)lesions/ strictures.  Upon entering the bladder, the stent was visualized from the right lateral wall anastomosis site.  The stent was grasped using flexible forceps.  The stent & scope were withdrawn simulatanesously.  She tolerated the procedure well & left the room in a pleasant disposition.      A/P:  Visit Dx:  1. Kidney transplant recipient        - stent removed today w/o difficulty.  - continue abx, as directed by Transplant team.  - f/u w/ Nephrology & Renal Transplant Team, per their recommendations.  - f/u w/ Urology prn.      Orders Placed This Encounter    CYSTO W/STENT REMOVAL       Ihor Gully, PA-C  Urology

## 2021-06-08 ENCOUNTER — Encounter: Admit: 2021-06-08 | Discharge: 2021-06-08 | Payer: MEDICARE

## 2021-06-10 ENCOUNTER — Encounter: Admit: 2021-06-10 | Discharge: 2021-06-10 | Payer: MEDICARE

## 2021-06-10 NOTE — Telephone Encounter
Advised that amoxicillin is ok with invasive dental procedures. Patient has a bad dental infection and has been prescribed amoxicillin to clear up infection and has a planned dental procedure in January they are need high dose amoxicillin for. Will address during this week clinic visit.

## 2021-06-10 NOTE — Telephone Encounter
Pt aunt Jodi Geralds is calling to get info on whether pt can have Amoxacillin after dental procedure.  Please aunt back @ [phone]

## 2021-06-11 ENCOUNTER — Encounter: Admit: 2021-06-11 | Discharge: 2021-06-11 | Payer: MEDICARE

## 2021-06-11 NOTE — Telephone Encounter
Pt called she messed up her medication and was asking if she needs to take it now or wait. Please call her back.

## 2021-06-12 ENCOUNTER — Ambulatory Visit: Admit: 2021-06-12 | Discharge: 2021-06-12 | Payer: MEDICARE

## 2021-06-12 ENCOUNTER — Encounter: Admit: 2021-06-12 | Discharge: 2021-06-12 | Payer: MEDICARE

## 2021-06-12 DIAGNOSIS — K5900 Constipation, unspecified: Secondary | ICD-10-CM

## 2021-06-12 DIAGNOSIS — Z20828 Contact with and (suspected) exposure to other viral communicable diseases: Secondary | ICD-10-CM

## 2021-06-12 DIAGNOSIS — Z79899 Other long term (current) drug therapy: Secondary | ICD-10-CM

## 2021-06-12 DIAGNOSIS — Z9189 Other specified personal risk factors, not elsewhere classified: Secondary | ICD-10-CM

## 2021-06-12 DIAGNOSIS — E103212 Type 1 diabetes mellitus with mild nonproliferative diabetic retinopathy with macular edema, left eye: Secondary | ICD-10-CM

## 2021-06-12 DIAGNOSIS — Z940 Kidney transplant status: Secondary | ICD-10-CM

## 2021-06-12 DIAGNOSIS — D849 Immunodeficiency, unspecified: Secondary | ICD-10-CM

## 2021-06-12 DIAGNOSIS — E1010 Type 1 diabetes mellitus with ketoacidosis without coma: Secondary | ICD-10-CM

## 2021-06-12 LAB — HIV VIRAL LOAD PCR QUANT

## 2021-06-12 LAB — URINALYSIS DIPSTICK REFLEX TO CULTURE
NITRITE: NEGATIVE
URINE ASCORBIC ACID, UA: NEGATIVE
URINE BILE: NEGATIVE
URINE KETONE: NEGATIVE
URINE SPEC GRAVITY: 1 mg/dL (ref 1.005–1.030)

## 2021-06-12 LAB — COMPREHENSIVE METABOLIC PANEL
ALK PHOSPHATASE: 96 U/L (ref 25–110)
ALT: 21 U/L (ref 7–56)
AST: 15 U/L (ref 7–40)
CHLORIDE: 105 MMOL/L (ref 98–110)
CO2: 23 MMOL/L (ref 21–30)
CREATININE: 1.1 mg/dL — ABNORMAL HIGH (ref 0.4–1.00)
POTASSIUM: 4.4 MMOL/L (ref 3.5–5.1)
SODIUM: 137 MMOL/L (ref 137–147)
TOTAL PROTEIN: 7.8 g/dL (ref 6.0–8.0)

## 2021-06-12 LAB — CBC AND DIFF
ABSOLUTE BASO COUNT: 0 K/UL (ref 0–0.20)
ABSOLUTE EOS COUNT: 0 K/UL (ref 0–0.45)
ABSOLUTE LYMPH COUNT: 0.9 K/UL — ABNORMAL LOW (ref 1.0–4.8)
ABSOLUTE MONO COUNT: 0.2 K/UL (ref >60)
LYMPHOCYTES %: 19 % — ABNORMAL LOW (ref 24–44)
MCHC: 33 g/dL (ref 32.0–36.0)
RBC COUNT: 3.8 M/UL — ABNORMAL LOW (ref 4.0–5.0)
WBC COUNT: 5.1 K/UL (ref 4.5–11.0)

## 2021-06-12 LAB — PHOSPHORUS: PHOSPHORUS: 2.6 mg/dL (ref 2.0–4.5)

## 2021-06-12 LAB — PROTEIN/CR RATIO,UR RAN
PROT CREAT RAT/CAL: 0.2 — ABNORMAL HIGH (ref <0.15)
UR CREATININE, RAN: 101 mg/dL (ref 5.0–8.0)

## 2021-06-12 LAB — URIC ACID: URIC ACID: 4.2 mg/dL (ref 2.0–7.0)

## 2021-06-12 LAB — MAGNESIUM: MAGNESIUM: 2.2 mg/dL — ABNORMAL HIGH (ref 1.6–2.6)

## 2021-06-12 LAB — URINALYSIS MICROSCOPIC REFLEX TO CULTURE

## 2021-06-12 LAB — BK VIRUS DNA, QUANT PLASMA

## 2021-06-12 LAB — ALLOSURE

## 2021-06-12 MED ORDER — AMOXICILLIN 500 MG PO CAP
2000 mg | ORAL_CAPSULE | Freq: Once | ORAL | 0 refills | 7.00000 days | Status: AC
Start: 2021-06-12 — End: ?

## 2021-06-12 MED FILL — MYCOPHENOLATE SODIUM 180 MG PO TBEC: 180 mg | ORAL | 30 days supply | Qty: 240 | Fill #2 | Status: CP

## 2021-06-12 MED FILL — ERGOCALCIFEROL (VITAMIN D2) 1,250 MCG (50,000 UNIT) PO CAP: 1250 mcg (50,000 unit) | ORAL | 28 days supply | Qty: 4 | Fill #2 | Status: AC

## 2021-06-12 MED FILL — CEPHALEXIN 500 MG PO CAP: 500 mg | ORAL | 30 days supply | Qty: 60 | Fill #3 | Status: AC

## 2021-06-12 MED FILL — VALGANCICLOVIR 450 MG PO TAB: 450 mg | ORAL | 58 days supply | Qty: 116 | Fill #1 | Status: CP

## 2021-06-12 NOTE — Progress Notes
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  Donor: DCD, IRD: Yes, Age: 80s, female, Ht 5'11. Wt 215 lbs. Creatinine Initial/Terminal. ?/1.8  KDPI: 47%, cPRA:? 75%  Match: VXM Negative, PXM -ve  CMV: D+/R+  Induction: thymoglobulin  Maintenance Immunosuppression: Triple drug therapy  Post OP: good UOP post op    Referring Nephrologist:  Lorre Munroe  [address] [place] [address]  Phone: [phone]  Fax: [phone]     Dear Dr. Lorre Munroe,    We had the pleasure of meeting Brandy Joseph in the [facility] for routine evaluation of her renal transplant. She was last seen 05/28/2021. She is accompanied by her caregiver, who is expressing challenges in remaining as her primary caregiver.    Home BPs are 120-150s/70s.    She reports having loose stools but denies diarrhea.     Home glucose readings: 64-199 in the morning 107-220 in the evening and 67-170 in the bedtime  Last HgA1C: 7.4% [05/07/2021]  Current regimen: Humalog insulin pump [0.8-1.65-2.1-0.8] + CGM  Compliant: Yes  Managed by: Endocrinology (has appt 06/11/2021)    She reports compliance with her immunosuppression regimen.     She is planning to have dental surgery due to ongoing infection.      REVIEW OF SYSTEMS: Comprehensive 14-point ROS reviewed  Positives noted in HPI otherwise negative.    Past History:  Medical History:   Diagnosis Date   ? DM (diabetes mellitus) (HCC)    ? Gastroparesis    ? Hypertension    ? Kidney disease    ? Memory loss    ? Stroke [facility])        Surgical History:   Procedure Laterality Date   ? ANKLE SURGERY Left 1996   ? FIBULA FRACTURE SURGERY Left 1996   ? ESOPHAGOGASTRODUODENOSCOPY WITH BIOPSY - FLEXIBLE N/A 03/29/2018    Performed by Virgina Organ, MD at [facility] ENDO   ? ESOPHAGOGASTRODUODENOSCOPY WITH BIOPSY - FLEXIBLE N/A 10/04/2019    Performed by Everardo All, MD at [facility] ENDO   ? COLONOSCOPY WITH BIOPSY - FLEXIBLE N/A 10/04/2019    Performed by Everardo All, MD at [facility] ENDO   ? ANGIOGRAPHY CORONARY ARTERY WITH LEFT HEART CATHETERIZATION N/A 01/18/2020    Performed by Reola Mosher, MD at [facility] CATH LAB   ? POSSIBLE PERCUTANEOUS CORONARY STENT PLACEMENT WITH ANGIOPLASTY N/A 01/18/2020    Performed by Reola Mosher, MD at [facility] CATH LAB   ? COLONOSCOPY WITH BIOPSY - FLEXIBLE N/A 05/15/2020    Performed by Eliott Nine, MD at [facility] ENDO   ? COLONOSCOPY WITH SNARE REMOVAL TUMOR/ POLYP/ OTHER LESION  05/15/2020    Performed by Eliott Nine, MD at [facility] ENDO   ? ALLOTRANSPLANTATION KIDNEY FROM NON LIVING DONOR WITHOUT RECIPIENT NEPHRECTOMY N/A 05/07/2021    Performed by Nicholos Johns, MD at [facility] OR   ? BURR HOLE      release pressure from stroke   ? DIALYSIS FISTULA CREATION     ? GALLBLADDER SURGERY     ? HX CESAREAN SECTION         Social History     Socioeconomic History   ? Marital status: Divorced   ? Number of children: 1   ?  Highest education level: Associate degree: occupational, Scientist, product/process development, or vocational program   Tobacco Use   ? Smoking status: Never   ? Smokeless tobacco: Never   Vaping Use   ? Vaping Use: Never used   Substance and Sexual Activity   ? Alcohol use: Not Currently     Alcohol/week: 0.0 standard drinks     Comment: rare, socially   ? Drug use: Never   ? Sexual activity: Yes     Partners: Male     Birth control/protection: None, Surgical       Family History   Problem Relation Age of Onset   ? Stroke Mother    ? Diabetes Paternal Grandmother    ? Heart problem Paternal Grandmother    ? Hypertension Paternal Grandmother    ? Tumor Maternal Grandmother         brain   ? None Reported Son        Allergies   Allergen Reactions   ? Cephalexin RASH   ? Ketorolac RASH   ? Sulfa (Sulfonamide Antibiotics) EDEMA     Full body swelling; eye swelling; itchiness       Current Medications:    Current Outpatient Medications:   ?  acetaminophen (TYLENOL) 325 mg tablet, Take two tablets by mouth every 4 hours as needed., Disp: 100 tablet, Rfl: 1  ?  albuterol sulfate (PROAIR HFA) 90 mcg/actuation HFA aerosol inhaler, Use as directed prior to pentamide treatments. Shake well before use, Disp: 6.7 g, Rfl: 0  ?  bisacodyL (DULCOLAX) 10 mg rectal suppository, Insert or Apply one suppository to rectal area as directed twice daily., Disp: 4 each, Rfl: 1  ?  buPROPion XL (WELLBUTRIN XL) 150 mg tablet, Take 150 mg by mouth every morning. Do not crush or chew., Disp: , Rfl:   ?  carvediloL (COREG) 25 mg tablet, Take one tablet by mouth twice daily. Take with food., Disp: 180 tablet, Rfl: 3  ?  cephalexin (KEFLEX) 500 mg capsule, Take one capsule by mouth twice daily. Indications: infection prophylaxis, medical, Disp: 60 capsule, Rfl: 5  ?  cloNIDine (CATAPRESS) 0.2 mg tablet, Take one tablet by mouth twice daily., Disp: 60 tablet, Rfl: 1  ?  docusate (COLACE) 100 mg capsule, Take one capsule by mouth twice daily., Disp: 180 capsule, Rfl: 1  ?  ERGOcalciferoL (vitamin D2) (DRISDOL) 1,250 mcg (50,000 unit) capsule, Take one capsule by mouth every 7 days., Disp: 12 capsule, Rfl: 0  ?  insulin lispro(+) (HUMALOG U-100 INSULIN) 100 unit/mL injection, Maximum of 50 units daily. Via insulin pump. Use 1:6 insulin to carb ratio and the following correction factor Glucose 140-180 = 1 units Glucose 181-220 = 2 units Glucose 221-260 = 3 units Glucose 261-300 = 4 units Glucose 301-350 = 5 units Glucose 351-400 = 6 units Glucose >400 = 7 units, Disp: 3 vial, Rfl: 3  ?  insulin pen needles (disposable) (BD UF NANO PEN NEEDLES) 32 gauge x 5/32 pen needle, Use one each as directed as Needed. Use with insulin injections., Disp: 300 each, Rfl: 0  ?  Insulin Syringe-Needle U-100 (BD INSULIN SYRINGE ULTRA-FINE) 1 mL 31 gauge x 5/16 syrg, Use with insulin vials as directed, Disp: 100 each, Rfl: 1  ?  lidocaine (LIDODERM) 5 % topical patch, Apply one patch topically to affected area daily. Apply patch for 12 hours, then remove for 12 hours before repeating., Disp: 10 patch, Rfl: 0  ?  magnesium oxide (MAGOX) 400 mg (241.3 mg  magnesium) tablet, Take two tablets by mouth daily., Disp: 30 tablet, Rfl: 5  ?  mycophenolate sodium (MYFORTIC) 180 mg tablet,delayed release, Take four tablets by mouth twice daily., Disp: 240 tablet, Rfl: 11  ?  ondansetron (ZOFRAN ODT) 4 mg rapid dissolve tablet, Dissolve one tablet by mouth every 8 hours as needed for Nausea or Vomiting. Place on tongue to dissolve., Disp: 15 tablet, Rfl: 0  ?  pantoprazole DR (PROTONIX) 40 mg tablet, Take one tablet by mouth daily., Disp: 30 tablet, Rfl: 1  ?  pentamidine (NEBUPENT) 300 mg/6 mL nebulizer solution, Inhale 6 mL solution by nebulizer as directed every 28 days., Disp: 1 each, Rfl: 4  ?  polyethylene glycol 3350 (MIRALAX) 17 gram/dose powder, Take thirty four g by mouth twice daily., Disp: 510 g, Rfl: 1  ?  potassium & sodium phosphates 280-160-250 mg packet, Take one packet by mouth daily., Disp: 14 packet, Rfl: 0  ?  predniSONE (DELTASONE) 5 mg tablet, Take 15mg  (3 tabs) by mouth once daily on days 11/16-11/21, then take 10mg  (2 tabs) once daily on days 11/23-11/29 then take 5 mg (1 tablet) daily thereafter., Disp: 120 tablet, Rfl: 1  ?  promethazine (PHENERGAN) 12.5 mg tablet, Take one tablet by mouth every 8 hours as needed for Nausea or Vomiting., Disp: 30 tablet, Rfl: 0  ?  rosuvastatin (CRESTOR) 20 mg tablet, Take one-half tablet by mouth daily., Disp: 15 tablet, Rfl: 1  ?  senna/docusate (SENOKOT-S) 8.6/50 mg tablet, Take two tablets by mouth twice daily. Hold for loose stools, Disp: 120 tablet, Rfl: 0  ?  tacrolimus (PROGRAF) 1 mg capsule, Take five capsules by mouth twice daily., Disp: 900 capsule, Rfl: 3  ?  tamsulosin (FLOMAX) 0.4 mg capsule, Take one capsule by mouth daily after breakfast. Do not crush, chew or open capsules. Take 30 minutes following the same meal each day., Disp: 30 capsule, Rfl: 1  ?  valGANciclovir (VALCYTE) 450 mg tablet, Take two tablets by mouth daily with breakfast., Disp: 116 tablet, Rfl: 2  ?  water (sterile) for injection (STERILE WATER FOR INJECTION) injection, For use with Nebupent, Disp: 10 mL, Rfl: 4    Current Facility-Administered Medications:   ?  darbepoetin alfa (ARANESP) injection 100 mcg, 100 mcg, Subcutaneous, Q7 Days, Corena Herter, MD, 100 mcg at 05/21/21 0912    Physical Exam:  Vitals:    06/12/21 1039 06/12/21 1049   BP: (!) 154/62 (!) 148/68   BP Source: Arm, Right Lower Arm, Right Lower   Pulse: 71 78   Temp: 36.8 ?C (98.3 ?F)    SpO2: 100%    TempSrc: Oral    PainSc: Three    Weight: 88.3 kg (194 lb 9.6 oz)    Height: 167.6 cm (5' 6)    Body mass index is 31.41 kg/m?Marland Kitchen    General: NAD, A+Ox4, calm and pleasant. Appears to be stated age. Walker  HENT: Unremarkable, no oral lesions, facial acne  Neck: Normal ROM, no LAD, no JVD  Lungs: Bilat. CTA  CV: RRR, S1, S2 without carotid bruit or murmur, no edema  Abdomen: Soft, N/D, N/T without hepatosplenomegaly  Incision: healing  M/S: Normal ROM and strength  Neuro: Nonfocal deficits without tremors  Skin: No skin rash  Psyc: Stable  Dialysis Access: LUE AVF/AVG good thrill/bruit      Laboratory studies:     CMP:  CMP Latest Ref Rng & Units 06/04/2021 05/28/2021 05/24/2021 05/21/2021 05/19/2021   NA 137 - 147 MMOL/L  139 138 136(L) 138 137   K 3.5 - 5.1 MMOL/L 4.3 4.7 4.0 3.9 3.9   CL 98 - 110 MMOL/L 104 104 102 104 108   CO2 21 - 30 MMOL/L 24 26 24 25 24    GAP 3 - 12 11 8 10 9 5    BUN 7 - 25 MG/DL 21 17 19 16 19    CR 0.4 - 1.00 MG/DL 9.60(A) 5.40(J) 8.11(B) 1.11(H) 1.37(H)   GLUX 70 - 100 MG/DL 147(W) 295(A) 213(Y) 865(H) 132(H)   CA 8.5 - 10.6 MG/DL 84.6 9.3 9.2 9.0 9.6(E)   TP 6.0 - 8.0 G/DL 7.6 7.2 6.7 6.4 -   ALB 3.5 - 5.0 G/DL 4.5 4.2 4.0 4.0 -   ALKP 25 - 110 U/L 101 95 98 89 -   ALT 7 - 56 U/L 18 22 19 23  -   TBILI 0.3 - 1.2 MG/DL 0.6 0.5 0.4 0.4 -   GFR >60 mL/min - - - - -   GFRAA >60 mL/min - - - - -     Hemoglobin A1C (%)   Date Value   05/07/2021 7.4 (H)   12/26/2020 10.5 (H)   12/21/2019 7.2 (H)     PTH Hormone (PG/ML)   Date Value   05/07/2021 396.7 (H)   03/24/2018 76.9 (H)     Lipase   Date Value Ref Range Status   05/16/2021 5 (L) 11 - 82 U/L Final   10/01/2019 31 11 - 82 U/L Final     No results found for: AMY  BK Virus Plasma Quant (no units)   Date Value   06/04/2021     NOT DETECTED  Reference range: NOT DETECTED  Unit: IU/mL  TESTING PERFORMED AT LOW VOLUME ON PLASMA SPECIMEN FOR BKV, MAY  AFFECT RESULTS.  Marland Kitchen  Assay Range: 33 IU/mL to 3.30E+08 IU/mL  .  One IU is equal to 0.33 copies of BKV.  .  The limit of quantitation (LOQ) is 33 IU/mL. BK virus DNA detected  below the LOQ  will be reported as Detected:<33 IU/mL.  .  This test was developed and its performance characteristics  determined by  [facility]. It has not been cleared or approved by the U.S.  Food and Drug  Administration. Results should be used in conjunction with clinical  findings,  and should not form the sole basis for a diagnosis or treatment  decision.  ____________________________________________________________  Testing Performed At:  [facility]  [address]  [place], [place] [address]  Laboratory Director: Lind Guest Ph.D., BCLD (ABB)  CLIA#: [id number]  Phone: [phone]     05/24/2021     NOT DETECTED  Reference range: NOT DETECTED  Unit: IU/mL  .  Assay Range: 33 IU/mL to 3.30E+08 IU/mL  .  One IU is equal to 0.33 copies of BKV.  .  The limit of quantitation (LOQ) is 33 IU/mL. BK virus DNA detected  below the LOQ  will be reported as Detected:<33 IU/mL.  .  This test was developed and its performance characteristics  determined by  [facility]. It has not been cleared or approved by the U.S.  Food and Drug  Administration. Results should be used in conjunction with clinical  findings,  and should not form the sole basis for a diagnosis or treatment  decision.  ____________________________________________________________  Testing Performed At:  [facility]  [address]  [place], [place] [address]  Laboratory Director: Lind Guest Ph.D., BCLD (ABB)  CLIA#: [id number]  Phone: [phone]     05/21/2021     NOT DETECTED  Reference range: NOT DETECTED  Unit: IU/mL  .  Assay Range: 33 IU/mL to 3.30E+08 IU/mL  .  One IU is equal to 0.33 copies of BKV.  .  The limit of quantitation (LOQ) is 33 IU/mL. BK virus DNA detected  below the LOQ  will be reported as Detected:<33 IU/mL.  .  This test was developed and its performance characteristics  determined by  [facility]. It has not been cleared or approved by the U.S.  Food and Drug  Administration. Results should be used in conjunction with clinical  findings,  and should not form the sole basis for a diagnosis or treatment  decision.  ____________________________________________________________  Testing Performed At:  [facility]  [address]  [place], [place] [address]  Laboratory Director: Lind Guest Ph.D., BCLD (ABB)  CLIA#: [id number]  Phone: [phone]     05/14/2021     NOT DETECTED  Reference range: NOT DETECTED  Unit: IU/mL  TESTING PERFORMED AT LOW VOLUME ON PLASMA SPECIMEN FOR BKV, MAY  AFFECT RESULTS.  Marland Kitchen  Assay Range: 33 IU/mL to 3.30E+08 IU/mL  .  One IU is equal to 0.33 copies of BKV.  .  The limit of quantitation (LOQ) is 33 IU/mL. BK virus DNA detected  below the LOQ  will be reported as Detected:<33 IU/mL.  .  This test was developed and its performance characteristics  determined by  [facility]. It has not been cleared or approved by the U.S.  Food and Drug  Administration. Results should be used in conjunction with clinical  findings,  and should not form the sole basis for a diagnosis or treatment  decision.  ____________________________________________________________  Testing Performed At:  [facility]  [address]  [place], [place] [address]  Laboratory Director: Lind Guest Ph.D., BCLD (ABB)  CLIA#: [id number]  Phone: [phone]       CMV DNA Quant PCR ([IU]/mL)   Date Value   06/04/2021 CMV DNA NOT DETECTED   05/24/2021 CMV DNA NOT DETECTED   05/21/2021 CMV DNA NOT DETECTED   05/14/2021 CMV DNA NOT DETECTED     IU/mL CMV Blood (no units)   Date Value   06/04/2021     <50 IU/mL  The test method detects and quantitates CMV DNA using the Abbott RealTime assay,   and is approved by the FDA for monitoring hematopoietic stem cell transplant   patients who are undergoing anti-CMV therapy.  Please correlate results with the   clinical status of the patient.     05/24/2021     <50 IU/mL  The test method detects and quantitates CMV DNA using the Abbott RealTime assay,   and is approved by the FDA for monitoring hematopoietic stem cell transplant   patients who are undergoing anti-CMV therapy.  Please correlate results with the   clinical status of the patient.     05/21/2021     <50 IU/mL  The test method detects and quantitates CMV DNA using the Abbott RealTime assay,   and is approved by the FDA for monitoring hematopoietic stem cell transplant   patients who are undergoing anti-CMV therapy.  Please correlate results with the   clinical status of the patient.     05/14/2021     <50 IU/mL  The test method detects and quantitates CMV DNA using the Abbott RealTime assay,   and is approved by the  FDA for monitoring hematopoietic stem cell transplant   patients who are undergoing anti-CMV therapy.  Please correlate results with the   clinical status of the patient.       No results found for: COPIES  No results found for: EBVDNAQT    TACROLIMUS LEVEL:  No results found for: TACROLIMUS    CBC with Diff:  CBC with Diff Latest Ref Rng & Units 06/04/2021 05/28/2021   WBC 4.5 - 11.0 K/UL 5.1 5.1   RBC 4.0 - 5.0 M/UL 3.86(L) 3.38(L)   HGB 12.0 - 15.0 GM/DL 16.1 10.5(L)   HCT 36 - 45 % 37.4 32.1(L)   MCV 80 - 100 FL 96.9 95.1   MCH 26 - 34 PG 31.8 31.1   MCHC 32.0 - 36.0 G/DL 09.6 04.5   RDW 11 - 15 % 15.0 14.4   PLT 150 - 400 K/UL 238 210   MPV 7 - 11 FL 7.8 7.9   NEUT 41 - 77 % 76 79(H)   ANC 1.8 - 7.0 K/UL 3.85 4.03   LYMA 24 - 44 % 18(L) 16(L)   ALYM 1.0 - 4.8 K/UL 0.92(L) 0.80(L)   MONA 4 - 12 % 2(L) 3(L)   AMONO 0 - 0.80 K/UL 0.12 0.16   EOSA 0 - 5 % 3 1   AEOS 0 - 0.45 K/UL 0.15 0.06   BASA 0 - 2 % 1 1   ABAS 0 - 0.20 K/UL 0.06 0.04     Lab Results   Component Value Date/Time    IRON 92 05/09/2021 05:36 AM    TIBC 192 (L) 05/09/2021 05:36 AM    PSAT 48 (H) 05/09/2021 05:36 AM    FERRITIN 1,156 (H) 05/09/2021 05:36 AM    FERRITIN 225 (H) 10/04/2019 05:08 AM       Urinalysis:  Lab Results   Component Value Date/Time    UCOLOR YELLOW 06/04/2021 08:57 AM    TURBID CLEAR 06/04/2021 08:57 AM    USPGR 1.013 06/04/2021 08:57 AM    UPH 6.0 06/04/2021 08:57 AM    UPROTEIN NEG 06/04/2021 08:57 AM    UAGLU 3+ (A) 06/04/2021 08:57 AM    UKET NEG 06/04/2021 08:57 AM    UBILE NEG 06/04/2021 08:57 AM    UBLD 3+ (A) 06/04/2021 08:57 AM    UROB NORMAL 06/04/2021 08:57 AM     Protein/CR ratio (no units)   Date Value   06/04/2021 0.2 (H)   05/28/2021 0.3 (H)   05/24/2021 0.4 (H)   05/21/2021 1.3 (H)   05/14/2021 1.1 (H)       Imaging:  Results for orders placed during the hospital encounter of 05/16/21    CT ABD/PELV WO CONTRAST    Impression  Interval renal transplantation without hydronephrosis or significant peritransplant fluid collection. No bowel obstruction is identified.      Finalized by Prince Rome, D.O. on 05/16/2021 3:43 PM. Dictated by Prince Rome, D.O. on 05/16/2021 3:34 PM.    Results for orders placed during the hospital encounter of 05/16/21    LINE PLCMT 1V CXR    Impression  Right IJ central venous catheter placement as described. No acute cardiopulmonary abnormality.      Finalized by Golda Acre, M.D. on 05/17/2021 11:10 AM. Dictated by Golda Acre, M.D. on 05/17/2021 11:10 AM.    Patient Active Problem List    Diagnosis Date Noted   ? 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 Constipation 05/16/2021   ?  Immunosuppression (HCC) 05/08/2021   ? Kidney transplant recipient 05/07/2021   ? Seasonal allergies 10/25/2020   ? ESRD (end stage renal disease) on dialysis (HCC) 12/21/2019   ? Pre-transplant evaluation for kidney and pancreas transplant 12/21/2019   ? Retention of urine 12/07/2019   ? Pulmonary infiltrates 10/26/2019   ? Anemia of chronic disease 10/25/2019   ? Esophageal candidiasis (HCC) 10/25/2019   ? AKI (acute kidney injury) (HCC) 09/28/2019   ? Hyperkalemia 04/26/2018   ? Vitreous hemorrhage of left eye (HCC) 04/11/2018   ? Cognitive impairment 04/11/2018   ? Neurogenic bladder 04/06/2018   ? Impaired transfers 04/06/2018   ? Intraventricular hemorrhage (HCC) 04/06/2018   ? Delirium 03/24/2018   ? Hypertensive emergency 03/18/2018   ? Renal insufficiency 03/18/2018   ? Pain of left hand 10/17/2015   ? Entrapment of right ulnar nerve 04/09/2015   ? Carpal tunnel syndrome of right wrist 04/09/2015   ? 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 Abnormal MRI, thoracic spine 12/06/2014   ? Type 1 diabetes mellitus with ketoacidosis without coma (HCC) 10/12/2014   ? Essential hypertension 10/12/2014   ? Diabetic retinopathy associated with type 1 diabetes mellitus (HCC) 10/12/2014   ? Neuropathic pain of right lower extremity 10/12/2014   ? Gait abnormality 10/12/2014   ? Impaired mobility and ADLs 10/12/2014   ? Sensory deficit, right 10/08/2014   ? Leg weakness 10/06/2014         Assessment and Plan:  Brandy Joseph is a 40 y.o. 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   ?  #) Immunosuppression - On maintenance Triple drug therapy  - Goal tacrolimus trough first 3 months after transplant is 8-12 mcg/L (HPLC/LCMS)  - Prograf 5 mg BID, MPA 720 mg BID and steroid pulse to wean to prednisone 5 mg daily  ?  #) Deceased donor renal transplant: bl Cr TBD mg/dL, bl UPCR TBD gm/gm (pre Tx UPCR bl 1.3 gm/gm)   - Cr stable 1.17 mg/dl  - [place] - adnexal vs lymphocele - as cr is down trending - will hold off on any intervention - may repeat US if cr stops to improve.   ?  #) ID Proph  - CMV Donor(+)/ Recipient(+) to treat with Valcyte for 3 months post transplant  - Keflex for urinary prophylaxis, pentamidine for PJP. Dose rec'd today  - the patient has Hx of recurrent UTI and Hx of neurogenic bladder.  ?  #) BPs: Good control    ?  #) Diabetes  - BGs have been 180s on BMPs  - on insulin pump- dose increased   - anticipate more difficult to control DM with steroids.   ?  #) Hx of CVA   - with residual memory loss and RLE weakness.  - will need ASA/statin  ?  #) Anemia - Hgb average of 10-12s  - at goal, monitor   - Iron studies: Iron sufficient on 05/09/2021  ?  #) CKD-MBD  - PTH?396 on 05/07/2021  - Vitamin D?16.5 on 11/2 - Treating with 25-Vit.D 50 000 unit weekly  - Ca acceptable  - PO4 acceptable    #) General health maint. Managed by PCP   Skin cancer surveillance: Annual follow up with Dermatology for skin eval due to long term immunosuppression   Cancer screening: Mammogram, Pap Smear, colonoscopy per guidelines   Annual high dose flu vaccine  Avoid all live vaccines. When clinically indicated or age appropriate, can receive the Shingrix vaccine one year post transplant.      Vaccinated for COVID-19 [Moderna] x 3 doses      Today, the patient is accompanied by her caregiver, who is currently expressing challenges in managing Ms. Urquidi. They are working with Medicare and the state of [place] in reference to establishing a consistent caregiver.        RTC in 2 weeks   Labs weekly     Sincerely,    Alessandra Bevels, APRN-NP  Transplant Nephrology Nurse Practitioner    Cc: Joli Koob Alderson  Cc: Christene Lye Darland    Please contact the [facility] at [phone] for any transplant related questions or concerns that may arise.        Total time minutes: 40  Reviewed data prior to visit: 10 minutes  Face to face time spent with patient:  20 minutes   After visit time spent on documentation and coordination of care: 10 minutes   Documentation review includes review of past medical history, Nephrology notes, interpretation of laboratory results  Face to face time included counseling and education patient and family/caregiver, examination, communicated laboratory results, care coordination

## 2021-06-12 NOTE — Progress Notes
Drinks orange juice when BS in 87s

## 2021-06-13 ENCOUNTER — Encounter: Admit: 2021-06-13 | Discharge: 2021-06-13 | Payer: MEDICARE

## 2021-06-13 DIAGNOSIS — Z79899 Other long term (current) drug therapy: Secondary | ICD-10-CM

## 2021-06-13 DIAGNOSIS — Z940 Kidney transplant status: Secondary | ICD-10-CM

## 2021-06-13 DIAGNOSIS — D849 Immunodeficiency, unspecified: Secondary | ICD-10-CM

## 2021-06-13 NOTE — Telephone Encounter
Pt called  she missed taking her medicine.Transferred to [facility].

## 2021-06-15 ENCOUNTER — Encounter: Admit: 2021-06-15 | Discharge: 2021-06-15 | Payer: MEDICARE

## 2021-06-17 ENCOUNTER — Encounter: Admit: 2021-06-17 | Discharge: 2021-06-17 | Payer: MEDICARE

## 2021-06-19 ENCOUNTER — Encounter: Admit: 2021-06-19 | Discharge: 2021-06-19 | Payer: MEDICARE

## 2021-06-20 ENCOUNTER — Encounter: Admit: 2021-06-20 | Discharge: 2021-06-20 | Payer: MEDICARE

## 2021-06-20 DIAGNOSIS — Z940 Kidney transplant status: Secondary | ICD-10-CM

## 2021-06-20 DIAGNOSIS — D849 Immunodeficiency, unspecified: Secondary | ICD-10-CM

## 2021-06-20 DIAGNOSIS — Z79899 Other long term (current) drug therapy: Secondary | ICD-10-CM

## 2021-06-20 LAB — TACROLIMUS LC-MS/MS: TACROLIMUS LC-MS/MS: 11

## 2021-06-21 ENCOUNTER — Encounter: Admit: 2021-06-21 | Discharge: 2021-06-21 | Payer: MEDICARE

## 2021-06-23 ENCOUNTER — Encounter: Admit: 2021-06-23 | Discharge: 2021-06-23 | Payer: MEDICARE

## 2021-06-23 DIAGNOSIS — Z940 Kidney transplant status: Secondary | ICD-10-CM

## 2021-06-23 DIAGNOSIS — D849 Immunodeficiency, unspecified: Secondary | ICD-10-CM

## 2021-06-23 DIAGNOSIS — Z79899 Other long term (current) drug therapy: Secondary | ICD-10-CM

## 2021-06-23 LAB — COMPREHENSIVE METABOLIC PANEL
ALBUMIN: 4 g/dL (ref 3.5–5)
ALK PHOSPHATASE: 92 U/L (ref 40–150)
ALT: 12 mL (ref 0–55)
ANION GAP: 7 meq/L — ABNORMAL LOW (ref 8–16)
AST: 11 U/L (ref 5–34)
BLD UREA NITROGEN: 16
CHLORIDE: 105 mmol/L (ref 98–107)
CO2: 23 mmol/L (ref 22–29)
CREATININE: 1.2 mg/dL — ABNORMAL HIGH (ref 0.57–1.11)
EGFR: 52 mL/Min/1.7 — ABNORMAL LOW (ref >59)
POTASSIUM: 4.5 mmol/L
SODIUM: 135 mmol/L — ABNORMAL LOW (ref 136–145)
TOTAL PROTEIN: 7.2 (ref 6.4–8)

## 2021-06-23 LAB — PROTEIN/CR RATIO,UR RAN
PROT CREAT RAT/CAL: 0.1 mg/mg (ref <0.2)
UR CREATININE, RAN: 144 mg/dL — ABNORMAL HIGH (ref 47–110)
UR TOTAL PROTEIN,RAN: 23 mg/dL — ABNORMAL HIGH (ref 1–14)

## 2021-06-23 LAB — CBC AND DIFF
ABSOLUTE BASO COUNT: 0 mL (ref 0.01–0.08)
ABSOLUTE EOS COUNT: 0 — ABNORMAL LOW (ref 0.04–0.54)
ABSOLUTE LYMPH COUNT: 0.9 x10-3/uL — ABNORMAL LOW (ref 1.18–3.74)
ABSOLUTE NEUTROPHIL: 4 X10-3/uL (ref 1.56–6.13)
BASOPHILS: 0.9 %
HEMATOCRIT: 38 % (ref 34.1–44.9)
HEMOGLOBIN: 12 g/dL (ref 11.2–15.7)
LYMPHOCYTES: 18 % — ABNORMAL LOW (ref 19.3–53.1)
MCH: 30 pg (ref 25.6–32.2)
MCHC: 31 — ABNORMAL LOW (ref 32.2–35.5)
MCV: 95 fL — ABNORMAL HIGH (ref 0.4–94.8)
MPV: 9.8 fL (ref 9.4–12.4)
NEUTROPHILS %: 75 % — ABNORMAL HIGH (ref 34.0–71.1)
PLATELET COUNT: 268 x10-3/uL (ref 182–369)
RBC COUNT: 4 x10-6/uL (ref 3.93–5.22)
WBC COUNT: 5.4 X10-3/uL (ref 3.98–10.04)

## 2021-06-23 LAB — URINALYSIS DIPSTICK REFLEX TO CULTURE
NITRITE: NEGATIVE
URINE BILE: NEGATIVE
URINE KETONE: NEGATIVE mL
UROBILINOGEN: 0.2

## 2021-06-23 LAB — PHOSPHORUS: PHOSPHORUS: 2.8 % — ABNORMAL LOW (ref 2.3–4.7)

## 2021-06-23 LAB — URIC ACID: URIC ACID: 4.7 mg/dL (ref 2.6–6.0)

## 2021-06-23 LAB — URINALYSIS MICROSCOPIC REFLEX TO CULTURE

## 2021-06-23 LAB — MAGNESIUM: MAGNESIUM: 2 mL — ABNORMAL LOW (ref 1.6–2.6)

## 2021-06-25 ENCOUNTER — Ambulatory Visit: Admit: 2021-06-25 | Discharge: 2021-06-25 | Payer: MEDICARE

## 2021-06-25 ENCOUNTER — Encounter: Admit: 2021-06-25 | Discharge: 2021-06-25 | Payer: MEDICARE

## 2021-06-25 DIAGNOSIS — I639 Cerebral infarction, unspecified: Secondary | ICD-10-CM

## 2021-06-25 DIAGNOSIS — I10 Essential (primary) hypertension: Secondary | ICD-10-CM

## 2021-06-25 DIAGNOSIS — N289 Disorder of kidney and ureter, unspecified: Secondary | ICD-10-CM

## 2021-06-25 DIAGNOSIS — Z940 Kidney transplant status: Secondary | ICD-10-CM

## 2021-06-25 DIAGNOSIS — D849 Immunodeficiency, unspecified: Secondary | ICD-10-CM

## 2021-06-25 DIAGNOSIS — E119 Type 2 diabetes mellitus without complications: Secondary | ICD-10-CM

## 2021-06-25 DIAGNOSIS — K3184 Gastroparesis: Secondary | ICD-10-CM

## 2021-06-25 DIAGNOSIS — Z79899 Other long term (current) drug therapy: Secondary | ICD-10-CM

## 2021-06-25 DIAGNOSIS — R413 Other amnesia: Secondary | ICD-10-CM

## 2021-06-25 LAB — COMPREHENSIVE METABOLIC PANEL
ALBUMIN: 4 g/dL — ABNORMAL LOW (ref 3.5–5.0)
ALT: 11 U/L (ref 7–56)
ANION GAP: 8 K/UL — ABNORMAL LOW (ref 3–12)
AST: 11 U/L (ref 7–40)
CO2: 26 MMOL/L (ref 21–30)
CREATININE: 1 mg/dL — ABNORMAL HIGH (ref 0.4–1.00)
EGFR: >60 mL/min (ref >60)
GLUCOSE,PANEL: 191 mg/dL — ABNORMAL HIGH (ref 70–100)
POTASSIUM: 4.5 MMOL/L — ABNORMAL LOW (ref 3.5–5.1)
SODIUM: 139 MMOL/L — ABNORMAL LOW (ref 137–147)
TOTAL PROTEIN: 6.8 g/dL (ref 6.0–8.0)

## 2021-06-25 LAB — URINALYSIS DIPSTICK REFLEX TO CULTURE
NITRITE: NEGATIVE
URINE ASCORBIC ACID, UA: NEGATIVE
URINE BILE: NEGATIVE
URINE KETONE: NEGATIVE

## 2021-06-25 LAB — PROTEIN/CR RATIO,UR RAN
PROT CREAT RAT/CAL: 0.2 — ABNORMAL HIGH (ref <0.15)
UR CREATININE, RAN: 120 mg/dL (ref 5.0–8.0)
UR TOTAL PROTEIN,RAN: 19 mg/dL (ref 1.005–1.030)

## 2021-06-25 LAB — URINALYSIS MICROSCOPIC REFLEX TO CULTURE

## 2021-06-25 LAB — PHOSPHORUS: PHOSPHORUS: 2.8 mg/dL (ref 2.0–4.5)

## 2021-06-25 LAB — CBC AND DIFF
ABSOLUTE BASO COUNT: 0 K/UL (ref 0–0.20)
ABSOLUTE EOS COUNT: 0 K/UL (ref 0–0.45)
RBC COUNT: 3.6 M/UL — ABNORMAL LOW (ref 4.0–5.0)
WBC COUNT: 5.5 K/UL (ref 4.5–11.0)

## 2021-06-25 LAB — URIC ACID: URIC ACID: 4 mg/dL (ref 2.0–7.0)

## 2021-06-25 LAB — MAGNESIUM: MAGNESIUM: 1.8 mg/dL (ref 1.6–2.6)

## 2021-06-25 LAB — ALLOSURE

## 2021-06-25 LAB — TACROLIMUS LC-MS/MS: TACROLIMUS LC-MS/MS: 12 mg/dL — ABNORMAL HIGH (ref 0.3–1.2)

## 2021-06-25 LAB — BK VIRUS DNA, QUANT PLASMA: BK VIRUS PLASMA QUANT: 82 U/L — ABNORMAL HIGH (ref 25–110)

## 2021-06-25 MED ORDER — CARVEDILOL 25 MG PO TAB
37.5 mg | ORAL_TABLET | Freq: Two times a day (BID) | ORAL | 1 refills | 90.00000 days | Status: AC
Start: 2021-06-25 — End: ?

## 2021-06-25 MED FILL — ERGOCALCIFEROL (VITAMIN D2) 1,250 MCG (50,000 UNIT) PO CAP: 1250 mcg (50,000 unit) | ORAL | 28 days supply | Qty: 4 | Fill #3 | Status: CP

## 2021-06-25 MED FILL — TACROLIMUS 1 MG PO CAP: 1 mg | ORAL | 30 days supply | Qty: 300 | Fill #1 | Status: CP

## 2021-06-25 NOTE — Progress Notes
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  Donor: DCD, IRD: Yes, Age: 40, female, Ht 5'11. Wt 215 lbs. Creatinine Initial/Terminal. ?/1.8  KDPI: 47%, cPRA:? 75%  Match: VXM Negative, PXM -ve  CMV: D+/R+  Induction: thymoglobulin  Maintenance Immunosuppression: Triple drug therapy  Post OP: good UOP post op    Referring Nephrologist:  Brandy Joseph  [address] [place] [address]  Phone: [phone]  Fax: [phone]     Dear Dr. Lorre Joseph,    We had the pleasure of meeting Ms. Brandy Joseph in the [facility] for routine evaluation of her renal transplantBP are higher than goal. Not drinking enough.reported decreased UOP. Still not familiar with her medications. Caregiver stress in mom. Weight is up and down a lot- likely faulty machine. On Insulin pump. Has received medtronic pump and sensor.      REVIEW OF SYSTEMS: Comprehensive 14-point ROS reviewed  Positives noted in HPI otherwise negative.    Past History:  Medical History:   Diagnosis Date   ? DM (diabetes mellitus) (HCC)    ? Gastroparesis    ? Hypertension    ? Kidney disease    ? Memory loss    ? Stroke [facility])        Surgical History:   Procedure Laterality Date   ? ANKLE SURGERY Left 1996   ? FIBULA FRACTURE SURGERY Left 1996   ? ESOPHAGOGASTRODUODENOSCOPY WITH BIOPSY - FLEXIBLE N/A 03/29/2018    Performed by Virgina Organ, MD at [facility] ENDO   ? ESOPHAGOGASTRODUODENOSCOPY WITH BIOPSY - FLEXIBLE N/A 10/04/2019    Performed by Everardo All, MD at [facility] ENDO   ? COLONOSCOPY WITH BIOPSY - FLEXIBLE N/A 10/04/2019    Performed by Everardo All, MD at [facility] ENDO   ? ANGIOGRAPHY CORONARY ARTERY WITH LEFT HEART CATHETERIZATION N/A 01/18/2020    Performed by Reola Mosher, MD at [facility] CATH LAB   ? POSSIBLE PERCUTANEOUS CORONARY STENT PLACEMENT WITH ANGIOPLASTY N/A 01/18/2020    Performed by Reola Mosher, MD at [facility] CATH LAB   ? COLONOSCOPY WITH BIOPSY - FLEXIBLE N/A 05/15/2020    Performed by Eliott Nine, MD at [facility] ENDO   ? COLONOSCOPY WITH SNARE REMOVAL TUMOR/ POLYP/ OTHER LESION  05/15/2020    Performed by Eliott Nine, MD at [facility] ENDO   ? ALLOTRANSPLANTATION KIDNEY FROM NON LIVING DONOR WITHOUT RECIPIENT NEPHRECTOMY N/A 05/07/2021    Performed by Nicholos Johns, MD at [facility] OR   ? BURR HOLE      release pressure from stroke   ? DIALYSIS FISTULA CREATION     ? GALLBLADDER SURGERY     ? HX CESAREAN SECTION         Social History     Socioeconomic History   ? Marital status: Divorced   ? Number of children: 1   ? Highest education level: Associate degree: occupational, Scientist, product/process development, or vocational program   Tobacco Use   ? Smoking status: Never   ? Smokeless tobacco: Never   Vaping Use   ? Vaping Use: Never used   Substance and Sexual Activity   ? Alcohol use: Not Currently     Alcohol/week: 0.0 standard drinks     Comment: rare, socially   ?  Drug use: Never   ? Sexual activity: Yes     Partners: Male     Birth control/protection: None, Surgical       Family History   Problem Relation Age of Onset   ? Stroke Mother    ? Diabetes Paternal Grandmother    ? Heart problem Paternal Grandmother    ? Hypertension Paternal Grandmother    ? Tumor Maternal Grandmother         brain   ? None Reported Son        Allergies   Allergen Reactions   ? Cephalexin RASH   ? Ketorolac RASH   ? Sulfa (Sulfonamide Antibiotics) EDEMA     Full body swelling; eye swelling; itchiness       Current Medications:    Current Outpatient Medications:   ?  acetaminophen (TYLENOL) 325 mg tablet, Take two tablets by mouth every 4 hours as needed., Disp: 100 tablet, Rfl: 1  ?  albuterol sulfate (PROAIR HFA) 90 mcg/actuation HFA aerosol inhaler, Use as directed prior to pentamide treatments. Shake well before use, Disp: 6.7 g, Rfl: 0  ?  bisacodyL (DULCOLAX) 10 mg rectal suppository, Insert or Apply one suppository to rectal area as directed twice daily., Disp: 4 each, Rfl: 1  ?  buPROPion XL (WELLBUTRIN XL) 150 mg tablet, Take 150 mg by mouth every morning. Do not crush or chew., Disp: , Rfl:   ?  carvediloL (COREG) 25 mg tablet, Take 1.5 tablets by mouth twice daily. Take with food., Disp: 135 tablet, Rfl: 1  ?  cephalexin (KEFLEX) 500 mg capsule, Take one capsule by mouth twice daily. Indications: infection prophylaxis, medical, Disp: 60 capsule, Rfl: 5  ?  cloNIDine (CATAPRESS) 0.2 mg tablet, Take one tablet by mouth twice daily., Disp: 60 tablet, Rfl: 1  ?  docusate (COLACE) 100 mg capsule, Take one capsule by mouth twice daily., Disp: 180 capsule, Rfl: 1  ?  ERGOcalciferoL (vitamin D2) (DRISDOL) 1,250 mcg (50,000 unit) capsule, Take one capsule by mouth every 7 days., Disp: 12 capsule, Rfl: 0  ?  insulin lispro(+) (HUMALOG U-100 INSULIN) 100 unit/mL injection, Maximum of 50 units daily. Via insulin pump. Use 1:6 insulin to carb ratio and the following correction factor Glucose 140-180 = 1 units Glucose 181-220 = 2 units Glucose 221-260 = 3 units Glucose 261-300 = 4 units Glucose 301-350 = 5 units Glucose 351-400 = 6 units Glucose >400 = 7 units, Disp: 3 vial, Rfl: 3  ?  insulin pen needles (disposable) (BD UF NANO PEN NEEDLES) 32 gauge x 5/32 pen needle, Use one each as directed as Needed. Use with insulin injections., Disp: 300 each, Rfl: 0  ?  Insulin Syringe-Needle U-100 (BD INSULIN SYRINGE ULTRA-FINE) 1 mL 31 gauge x 5/16 syrg, Use with insulin vials as directed, Disp: 100 each, Rfl: 1  ?  magnesium oxide (MAGOX) 400 mg (241.3 mg magnesium) tablet, Take two tablets by mouth daily., Disp: 30 tablet, Rfl: 5  ?  mycophenolate sodium (MYFORTIC) 180 mg tablet,delayed release, Take four tablets by mouth twice daily., Disp: 240 tablet, Rfl: 11  ?  ondansetron (ZOFRAN ODT) 4 mg rapid dissolve tablet, Dissolve one tablet by mouth every 8 hours as needed for Nausea or Vomiting. Place on tongue to dissolve., Disp: 15 tablet, Rfl: 0  ?  pantoprazole DR (PROTONIX) 40 mg tablet, Take one tablet by mouth daily., Disp: 30 tablet, Rfl: 1  ?  pentamidine (NEBUPENT) 300 mg/6 mL nebulizer solution, Inhale 6  mL solution by nebulizer as directed every 28 days., Disp: 1 each, Rfl: 4  ?  polyethylene glycol 3350 (MIRALAX) 17 gram/dose powder, Take thirty four g by mouth twice daily., Disp: 510 g, Rfl: 1  ?  potassium & sodium phosphates 280-160-250 mg packet, Take one packet by mouth daily., Disp: 14 packet, Rfl: 0  ?  predniSONE (DELTASONE) 5 mg tablet, Take 15mg  (3 tabs) by mouth once daily on days 11/16-11/21, then take 10mg  (2 tabs) once daily on days 11/23-11/29 then take 5 mg (1 tablet) daily thereafter., Disp: 120 tablet, Rfl: 1  ?  promethazine (PHENERGAN) 12.5 mg tablet, Take one tablet by mouth every 8 hours as needed for Nausea or Vomiting., Disp: 30 tablet, Rfl: 0  ?  rosuvastatin (CRESTOR) 20 mg tablet, Take one-half tablet by mouth daily., Disp: 15 tablet, Rfl: 1  ?  senna/docusate (SENOKOT-S) 8.6/50 mg tablet, Take two tablets by mouth twice daily. Hold for loose stools, Disp: 120 tablet, Rfl: 0  ?  tacrolimus (PROGRAF) 1 mg capsule, Take five capsules by mouth twice daily., Disp: 900 capsule, Rfl: 3  ?  tamsulosin (FLOMAX) 0.4 mg capsule, Take one capsule by mouth daily after breakfast. Do not crush, chew or open capsules. Take 30 minutes following the same meal each day., Disp: 30 capsule, Rfl: 1  ?  valGANciclovir (VALCYTE) 450 mg tablet, Take two tablets by mouth daily with breakfast., Disp: 116 tablet, Rfl: 2  ?  water (sterile) for injection (STERILE WATER FOR INJECTION) injection, For use with Nebupent, Disp: 10 mL, Rfl: 4    Current Facility-Administered Medications:   ?  darbepoetin alfa (ARANESP) injection 100 mcg, 100 mcg, Subcutaneous, Q7 Days, Corena Herter, MD, 100 mcg at 05/21/21 0912    Physical Exam:  Vitals:    06/25/21 0945 06/25/21 0947   BP: (!) 148/65 (!) 148/63 BP Source: Arm, Right Upper Arm, Right Upper   Pulse: 72 72   Temp: 36.4 ?C (97.6 ?F)    SpO2: 100%    TempSrc: Oral    PainSc: Zero    Weight: 90.3 kg (199 lb)    Height: 167.6 cm (5' 6)    Body mass index is 32.12 kg/m?Marland Kitchen    General: NAD, A+Ox4, calm and pleasant. Appears to be stated age. Walker  HENT: Unremarkable, no oral lesions  Neck: Normal ROM, no LAD, no JVD  Lungs: Bilat. CTA  CV: RRR, S1, S2 without carotid bruit or murmur, no edema  Abdomen: Soft, N/D, N/T without hepatosplenomegaly  Incision: healing  M/S: Normal ROM and strength  Neuro: Nonfocal deficits without tremors  Skin: No skin rash  Psyc: Stable  Dialysis Access: LUE AVF/AVG good thrill/bruit    Laboratory studies:     CMP:  CMP Latest Ref Rng & Units 06/25/2021 06/18/2021 06/12/2021 06/04/2021 05/28/2021   NA 137 - 147 MMOL/L 139 135(L) 137 139 138   K 3.5 - 5.1 MMOL/L 4.5 4.5 4.4 4.3 4.7   CL 98 - 110 MMOL/L 105 105 105 104 104   CO2 21 - 30 MMOL/L 26 23.0 23 24 26    GAP 3 - 12 8 7(L) 9 11 8    BUN 7 - 25 MG/DL 18 16.1 18 21 17    CR 0.4 - 1.00 MG/DL 0.96(E) 4.54(U) 9.81(X) 1.03(H) 1.33(H)   GLUX 70 - 100 MG/DL 914(N) - 829(F) 621(H) 193(H)   CA 8.5 - 10.6 MG/DL 9.2 9.3 9.8 08.6 9.3   TP 6.0 - 8.0 G/DL 6.8 7.2 7.8  7.6 7.2   ALB 3.5 - 5.0 G/DL 4.0 4.0 4.5 4.5 4.2   ALKP 25 - 110 U/L 75 92 96 101 95   ALT 7 - 56 U/L 11 12 21 18 22    TBILI 0.3 - 1.2 MG/DL 0.3 8.11 0.4 0.6 0.5   GFR >60 mL/min - - - - -   GFRAA >60 mL/min - - - - -     Hemoglobin A1C (%)   Date Value   05/07/2021 7.4 (H)   12/26/2020 10.5 (H)   12/21/2019 7.2 (H)     PTH Hormone (PG/ML)   Date Value   05/07/2021 396.7 (H)   03/24/2018 76.9 (H)     Lipase   Date Value Ref Range Status   05/16/2021 5 (L) 11 - 82 U/L Final   10/01/2019 31 11 - 82 U/L Final     No results found for: AMY  BK Virus Plasma Quant (no units)   Date Value   06/12/2021     NOT DETECTED  Reference range: NOT DETECTED  Unit: IU/mL  TESTING PERFORMED AT LOW VOLUME ON PLASMA SPECIMEN FOR BKV, MAY  AFFECT RESULTS.  Marland Kitchen  Assay Range: 33 IU/mL to 3.30E+08 IU/mL  .  One IU is equal to 0.33 copies of BKV.  .  The limit of quantitation (LOQ) is 33 IU/mL. BK virus DNA detected  below the LOQ  will be reported as Detected:<33 IU/mL.  .  This test was developed and its performance characteristics  determined by  [facility]. It has not been cleared or approved by the U.[place].  Food and Drug  Administration. Results should be used in conjunction with clinical  findings,  and should not form the sole basis for a diagnosis or treatment  decision.  ____________________________________________________________  Testing Performed At:  [facility]  [address]  [place], [place] [address]  Laboratory Director: Lind Guest Ph.D., BCLD (ABB)  CLIA#: [id number]  Phone: [phone]     06/04/2021     NOT DETECTED  Reference range: NOT DETECTED  Unit: IU/mL  TESTING PERFORMED AT LOW VOLUME ON PLASMA SPECIMEN FOR BKV, MAY  AFFECT RESULTS.  Marland Kitchen  Assay Range: 33 IU/mL to 3.30E+08 IU/mL  .  One IU is equal to 0.33 copies of BKV.  .  The limit of quantitation (LOQ) is 33 IU/mL. BK virus DNA detected  below the LOQ  will be reported as Detected:<33 IU/mL.  .  This test was developed and its performance characteristics  determined by  [facility]. It has not been cleared or approved by the U.S.  Food and Drug  Administration. Results should be used in conjunction with clinical  findings,  and should not form the sole basis for a diagnosis or treatment  decision.  ____________________________________________________________  Testing Performed At:  [facility]  [address]  [place], [place] [address]  Laboratory Director: Lind Guest Ph.D., BCLD (ABB)  CLIA#: [id number]  Phone: [phone]     05/24/2021     NOT DETECTED  Reference range: NOT DETECTED  Unit: IU/mL  .  Assay Range: 33 IU/mL to 3.30E+08 IU/mL  .  One IU is equal to 0.33 copies of BKV.  .  The limit of quantitation (LOQ) is 33 IU/mL. BK virus DNA detected  below the LOQ  will be reported as Detected:<33 IU/mL.  .  This test was developed and its performance characteristics  determined by  [facility]. It has not been cleared or  approved by the U.S.  Food and Drug  Administration. Results should be used in conjunction with clinical  findings,  and should not form the sole basis for a diagnosis or treatment  decision.  ____________________________________________________________  Testing Performed At:  [facility]  [address]  [place], [place] [address]  Laboratory Director: Lind Guest Ph.D., BCLD (ABB)  CLIA#: [id number]  Phone: [phone]     05/21/2021     NOT DETECTED  Reference range: NOT DETECTED  Unit: IU/mL  .  Assay Range: 33 IU/mL to 3.30E+08 IU/mL  .  One IU is equal to 0.33 copies of BKV.  .  The limit of quantitation (LOQ) is 33 IU/mL. BK virus DNA detected  below the LOQ  will be reported as Detected:<33 IU/mL.  .  This test was developed and its performance characteristics  determined by  [facility]. It has not been cleared or approved by the U.S.  Food and Drug  Administration. Results should be used in conjunction with clinical  findings,  and should not form the sole basis for a diagnosis or treatment  decision.  ____________________________________________________________  Testing Performed At:  [facility]  [address]  [place], [place] [address]  Laboratory Director: Lind Guest Ph.D., BCLD (ABB)  CLIA#: [id number]  Phone: [phone]     05/14/2021     NOT DETECTED  Reference range: NOT DETECTED  Unit: IU/mL  TESTING PERFORMED AT LOW VOLUME ON PLASMA SPECIMEN FOR BKV, MAY  AFFECT RESULTS.  Marland Kitchen  Assay Range: 33 IU/mL to 3.30E+08 IU/mL  .  One IU is equal to 0.33 copies of BKV.  .  The limit of quantitation (LOQ) is 33 IU/mL. BK virus DNA detected  below the LOQ  will be reported as Detected:<33 IU/mL.  .  This test was developed and its performance characteristics  determined by  [facility]. It has not been cleared or approved by the U.S.  Food and Drug  Administration. Results should be used in conjunction with clinical  findings,  and should not form the sole basis for a diagnosis or treatment  decision.  ____________________________________________________________  Testing Performed At:  [facility]  [address]  [place], [place] [address]  Laboratory Director: Lind Guest Ph.D., BCLD (ABB)  CLIA#: [id number]  Phone: [phone]       CMV DNA Quant PCR ([IU]/mL)   Date Value   06/12/2021 CMV DNA NOT DETECTED   06/04/2021 CMV DNA NOT DETECTED   05/24/2021 CMV DNA NOT DETECTED   05/21/2021 CMV DNA NOT DETECTED   05/14/2021 CMV DNA NOT DETECTED     IU/mL CMV Blood (no units)   Date Value   06/12/2021     <50 IU/mL  The test method detects and quantitates CMV DNA using the Abbott RealTime assay,   and is approved by the FDA for monitoring hematopoietic stem cell transplant   patients who are undergoing anti-CMV therapy.  Please correlate results with the   clinical status of the patient.     06/04/2021     <50 IU/mL  The test method detects and quantitates CMV DNA using the Abbott RealTime assay,   and is approved by the FDA for monitoring hematopoietic stem cell transplant   patients who are undergoing anti-CMV therapy.  Please correlate results with the   clinical status of the patient.     05/24/2021     <50 IU/mL  The test method detects and quantitates CMV DNA using the Abbott  RealTime assay,   and is approved by the FDA for monitoring hematopoietic stem cell transplant   patients who are undergoing anti-CMV therapy.  Please correlate results with the   clinical status of the patient.     05/21/2021     <50 IU/mL  The test method detects and quantitates CMV DNA using the Abbott RealTime assay,   and is approved by the FDA for monitoring hematopoietic stem cell transplant   patients who are undergoing anti-CMV therapy.  Please correlate results with the   clinical status of the patient.     05/14/2021     <50 IU/mL  The test method detects and quantitates CMV DNA using the Abbott RealTime assay,   and is approved by the FDA for monitoring hematopoietic stem cell transplant   patients who are undergoing anti-CMV therapy.  Please correlate results with the   clinical status of the patient.       No results found for: COPIES  No results found for: EBVDNAQT    TACROLIMUS LEVEL:  No results found for: TACROLIMUS    CBC with Diff:  CBC with Diff Latest Ref Rng & Units 06/25/2021 06/18/2021   WBC 4.5 - 11.0 K/UL 5.5 5.40   RBC 4.0 - 5.0 M/UL 3.62(L) 4.01   HGB 12.0 - 15.0 GM/DL 11.2(L) 12.1   HCT 36 - 45 % 34.4(L) 38.3   MCV 80 - 100 FL 94.9 95.5(H)   MCH 26 - 34 PG 31.0 30.2   MCHC 32.0 - 36.0 G/DL 16.1 31.6(L)   RDW 11 - 15 % 13.9 -   PLT 150 - 400 K/UL 238 268   MPV 7 - 11 FL 8.5 9.8   NEUT 41 - 77 % 79(H) 75.5(H)   ANC 1.8 - 7.0 K/UL 4.37 4.08   LYMA 24 - 44 % 16(L) -   ALYM 1.0 - 4.8 K/UL 0.88(L) 0.99(L)   MONA 4 - 12 % 3(L) -   AMONO 0 - 0.80 K/UL 0.17 0.22(L)   EOSA 0 - 5 % 1 -   AEOS 0 - 0.45 K/UL 0.04 0.03(L)   BASA 0 - 2 % 1 -   ABAS 0 - 0.20 K/UL 0.04 0.05     Lab Results   Component Value Date/Time    IRON 92 05/09/2021 05:36 AM    TIBC 192 (L) 05/09/2021 05:36 AM    PSAT 48 (H) 05/09/2021 05:36 AM    FERRITIN 1,156 (H) 05/09/2021 05:36 AM    FERRITIN 225 (H) 10/04/2019 05:08 AM       Urinalysis:  Lab Results   Component Value Date/Time    UCOLOR YELLOW 06/25/2021 09:33 AM    TURBID CLEAR 06/25/2021 09:33 AM    USPGR 1.019 06/25/2021 09:33 AM    UPH 6.0 06/25/2021 09:33 AM    UPROTEIN NEG 06/25/2021 09:33 AM    UAGLU 1+ (A) 06/25/2021 09:33 AM    UKET NEG 06/25/2021 09:33 AM    UBILE NEG 06/25/2021 09:33 AM    UBLD 2+ (A) 06/25/2021 09:33 AM    UROB NORMAL 06/25/2021 09:33 AM     Protein/CR ratio   Date Value   06/25/2021 0.2 (H)   06/18/2021 0.16574 mg/mg   06/12/2021 0.2 (H)   06/04/2021 0.2 (H)   05/28/2021 0.3 (H)       Imaging:  Results for orders placed during the hospital encounter of 05/16/21    CT ABD/PELV WO CONTRAST    Impression  Interval  renal transplantation without hydronephrosis or significant peritransplant fluid collection. No bowel obstruction is identified.      Finalized by Prince Rome, D.O. on 05/16/2021 3:43 PM. Dictated by Prince Rome, D.O. on 05/16/2021 3:34 PM.    Results for orders placed during the hospital encounter of 05/16/21    LINE PLCMT 1V CXR    Impression  Right IJ central venous catheter placement as described. No acute cardiopulmonary abnormality.      Finalized by Golda Acre, M.D. on 05/17/2021 11:10 AM. Dictated by Golda Acre, M.D. on 05/17/2021 11:10 AM.      Assessment and Plan:  Heiley Bloemer is a 40 y.o. 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   ?  #) Immunosuppression - On maintenance Triple drug therapy  - Goal tacrolimus trough first 3 months after transplant is 8-12 mcg/L (HPLC/LCMS)  - Prograf 5 mg BID, MPA 720 mg BID and steroid pulse to wean to prednisone 5 mg daily  ?  #) Deceased donor renal transplant: bl Cr TBD mg/dL, bl UPCR TBD gm/gm (pre Tx UPCR bl 1.3 gm/gm)   - Cr improving  - [place] - adnexal vs lymphocele - as cr is down trending - will hold off on any intervention  ?  #) ID Proph  - CMV Donor(+)/ Recipient(+) to treat with Valcyte for 3 months post transplant  - Keflex for urinary prophylaxis, pentamidine for PJP. Dose rec'd today  - the patient has Hx of recurrent UTI and Hx of neurogenic bladder.  ?  #) BPs:   - hyper- increase to coreg 37.5mg  BID  ?  #) Diabetes  - BGs have been 180s on BMPs  - on insulin pump- dose increased  ?  #) Hx of CVA   - with residual memory loss and RLE weakness.  - will need ASA/statin  ?  #) Anemia - Hgb average of 10-12s  - at goal, monitor   - Iron studies: Iron sufficient on 05/09/2021  ?  #) CKD-MBD  - PTH?396 on 05/07/2021  - Vitamin D?16.5 on 11/2 - Treating with 25-Vit.D 50 000 unit weekly  - Ca acceptable  - PO4 acceptable    #) memory issues  - unable to remember meds, timings etc    #) General health maint. Managed by PCP   Skin cancer surveillance: Annual follow up with Dermatology for skin eval due to long term immunosuppression   Cancer screening: Mammogram, Pap Smear, colonoscopy per guidelines   Annual high dose flu vaccine    Avoid all live vaccines. When clinically indicated or age appropriate, can receive the Shingrix vaccine one year post transplant.      Vaccinated for COVID-19 [Moderna] x 3 doses    RTC in 2 weeks   Labs weekly     Sincerely,    Corena Herter, MD  Transplant Nephrology    Cc: Thomas Alderson  Cc: Christene Lye Darland    Please contact the [facility] at [phone] for any transplant related questions or concerns that may arise.

## 2021-06-25 NOTE — Patient Instructions
COREG INCREASED TO 1 AND 1/2 PILL EVERY DAY     LABS WEEKLY

## 2021-06-25 NOTE — Progress Notes
Clinic notes reviewed, no change to IS, tacrolimus pending.  Coreg increased, med list updated.

## 2021-06-25 NOTE — Progress Notes
Transplant Pharmacist Managed [facility] Follow-up    Brandy Joseph received a kidney transplant on 05/07/2021.    Allergies  Allergies   Allergen Reactions   ? Cephalexin RASH   ? Ketorolac RASH   ? Sulfa (Sulfonamide Antibiotics) EDEMA     Full body swelling; eye swelling; itchiness       Pertinent Labs  CBC w diff  CBC with Diff Latest Ref Rng & Units 06/25/2021 06/18/2021 06/12/2021 06/04/2021 05/28/2021   WBC 4.5 - 11.0 K/UL 5.5 5.40 5.1 5.1 5.1   RBC 4.0 - 5.0 M/UL 3.62(L) 4.01 3.86(L) 3.86(L) 3.38(L)   HGB 12.0 - 15.0 GM/DL 11.2(L) 12.1 12.3 12.3 10.5(L)   HCT 36 - 45 % 34.4(L) 38.3 36.6 37.4 32.1(L)   MCV 80 - 100 FL 94.9 95.5(H) 94.9 96.9 95.1   MCH 26 - 34 PG 31.0 30.2 32.0 31.8 31.1   MCHC 32.0 - 36.0 G/DL 16.1 31.6(L) 33.7 32.8 32.7   RDW 11 - 15 % 13.9 - 14.2 15.0 14.4   PLT 150 - 400 K/UL 238 268 266 238 210   MPV 7 - 11 FL 8.5 9.8 7.9 7.8 7.9   NEUT 41 - 77 % 79(H) 75.5(H) 73 76 79(H)   ANC 1.8 - 7.0 K/UL 4.37 4.08 3.77 3.85 4.03   LYMA 24 - 44 % 16(L) - 19(L) 18(L) 16(L)   ALYM 1.0 - 4.8 K/UL 0.88(L) 0.99(L) 0.94(L) 0.92(L) 0.80(L)   MONA 4 - 12 % 3(L) - 5 2(L) 3(L)   AMONO 0 - 0.80 K/UL 0.17 0.22(L) 0.24 0.12 0.16   EOSA 0 - 5 % 1 - 1 3 1    AEOS 0 - 0.45 K/UL 0.04 0.03(L) 0.03 0.15 0.06   BASA 0 - 2 % 1 - 2 1 1    ABAS 0 - 0.20 K/UL 0.04 0.05 0.08 0.06 0.04       Comprehensive Metabolic Profile  CMP Latest Ref Rng & Units 06/25/2021 06/18/2021 06/12/2021 06/04/2021 05/28/2021   NA 137 - 147 MMOL/L 139 135(L) 137 139 138   K 3.5 - 5.1 MMOL/L 4.5 4.5 4.4 4.3 4.7   CL 98 - 110 MMOL/L 105 105 105 104 104   CO2 21 - 30 MMOL/L 26 23.0 23 24 26    GAP 3 - 12 8 7(L) 9 11 8    BUN 7 - 25 MG/DL 18 09.6 18 21 17    CR 0.4 - 1.00 MG/DL 0.45(W) 0.98(J) 1.91(Y) 1.03(H) 1.33(H)   GLUX 70 - 100 MG/DL 782(N) - 562(Z) 308(M) 193(H)   CA 8.5 - 10.6 MG/DL 9.2 9.3 9.8 57.8 9.3   TP 6.0 - 8.0 G/DL 6.8 7.2 7.8 7.6 7.2   ALB 3.5 - 5.0 G/DL 4.0 4.0 4.5 4.5 4.2   ALKP 25 - 110 U/L 75 92 96 101 95 ALT 7 - 56 U/L 11 12 21 18 22    TBILI 0.3 - 1.2 MG/DL 0.3 4.69 0.4 0.6 0.5   GFR >60 mL/min - - - - -   GFRAA >60 mL/min - - - - -       LOG10  CMV   Date Value Ref Range Status   06/12/2021 <1.7  Final     CMV DNA Quant PCR   Date Value Ref Range Status   06/12/2021 CMV DNA NOT DETECTED CMVND-CMV DNA NOT DETECTED [IU]/mL Final       Weight:  Current Actual Body Weight in Kilograms: 90.3  Ideal Body Weight in Kilograms:  59.25  Adjusted Body Weight in Kilograms: 71.66    Renal Function:  Calculated creatinine clearance based on Cockcroft-Gault Equation based on IBW: 63.5 ml/min  Calculated creatinine clearance based on Cockcroft-Gault Equation based on Adjusted Body Weight: >70 ml/min  Calculated creatinine clearance based on Cockcroft-Gault Equation based on Actual Body Weight: >70 ml/min    eGFR based on MDRD: 55.91      Assessment/Plan    Infection    CMV  Status: D+/R+ (verified in UNet)  Regimen: valganciclovir 900mg  daily for 3 months until 08/07/2021 for prophylaxis Follow-up labs due according to standard follow-up protocol.    Monitoring: Renal function is improved. Thrombocytopenia is not present. Leukopenia is not present. CMV viremia is not present.  Dose evaluation: The current dose is appropriate. Continue Valcyte 900mg  daily      Education   Education was not provided.  Services provided: antimicrobial review and medication review        Lonzo Cloud, PHARMD

## 2021-06-26 ENCOUNTER — Encounter: Admit: 2021-06-26 | Discharge: 2021-06-26 | Payer: MEDICARE

## 2021-06-26 DIAGNOSIS — Z940 Kidney transplant status: Secondary | ICD-10-CM

## 2021-06-26 MED ORDER — TACROLIMUS 1 MG PO CAP
4 mg | ORAL_CAPSULE | Freq: Two times a day (BID) | ORAL | 3 refills | 30.00000 days | Status: AC
Start: 2021-06-26 — End: ?
  Filled 2021-09-12: qty 120, 30d supply, fill #1

## 2021-06-28 ENCOUNTER — Encounter: Admit: 2021-06-28 | Discharge: 2021-06-28 | Payer: MEDICARE

## 2021-07-01 ENCOUNTER — Encounter: Admit: 2021-07-01 | Discharge: 2021-07-01 | Payer: MEDICARE

## 2021-07-01 NOTE — Telephone Encounter
TC to support person Tess about missed medications. Tess voiced concern about this as well as patient was previously advised to set a timer to remind her of medication times. Tess is unsure if her timer is set right and will check this.     Patient does have [facility] RN coming 3 hours daily during the day. Patient does not have support at night and boyfriend is a truck driver and not home at night. Tess voiced concern about boyfriend not having cognitive ability to provide support to patient.     TC to patient to discuss setting multiple timers to remind her to take medication. Patient has been at home whenever she misses medications and has been alone. Discussed with patient about med action plan app and advised I could help her set this up next clinic visit. Also discussed that she may need to keep a copy of her med action plan in her purse with a note on it that she had a transplant on 05/07/2021. Patient stated she would rather keep it on her phone, advised that both would be great in case someone was unable to unlock her phone. Patient acknowledged. Patient also said she gets very nervous when she misses her medications and doesn't know if she should take them after she misses them. Re-iterated to patient that she needs to page on call coordinator each time she misses medications to notify us and be advised if/when she should take her meds. Patient acknowledged. Asked Tess to write these directions and on call phone number on her whiteboard. Tess acknowledged. No further questions/concerns.

## 2021-07-03 ENCOUNTER — Encounter: Admit: 2021-07-03 | Discharge: 2021-07-03 | Payer: MEDICARE

## 2021-07-03 DIAGNOSIS — D849 Immunodeficiency, unspecified: Secondary | ICD-10-CM

## 2021-07-03 DIAGNOSIS — Z79899 Other long term (current) drug therapy: Secondary | ICD-10-CM

## 2021-07-03 DIAGNOSIS — Z940 Kidney transplant status: Secondary | ICD-10-CM

## 2021-07-03 LAB — URINALYSIS DIPSTICK REFLEX TO CULTURE
PROTEIN,UA: NEGATIVE mg/dL — AB (ref 0–5)
URINE BILE: NEGATIVE
URINE KETONE: NEGATIVE — ABNORMAL HIGH (ref 70–105)
UROBILINOGEN: 0.2

## 2021-07-03 LAB — URINALYSIS MICROSCOPIC REFLEX TO CULTURE

## 2021-07-03 LAB — COMPREHENSIVE METABOLIC PANEL
ALBUMIN: 4.3
CALCIUM: 9.4 — AB
EGFR: 45 — AB (ref >59)
POTASSIUM: 4.7 K/UL — ABNORMAL HIGH (ref 47–110)
SODIUM: 135 mmol/L — ABNORMAL LOW (ref 136–145)
TOTAL BILIRUBIN: 0.5 mg/dL
TOTAL PROTEIN: 7.2

## 2021-07-03 LAB — URIC ACID: URIC ACID: 4.7 K/UL (ref 1.0–4.8)

## 2021-07-03 LAB — PHOSPHORUS: PHOSPHORUS: 3.1 % (ref 0–2)

## 2021-07-03 LAB — MAGNESIUM: MAGNESIUM: 1.7 K/UL (ref 1.8–7.0)

## 2021-07-03 NOTE — Progress Notes
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    Allergies  Allergies   Allergen Reactions   ? Ketorolac RASH   ? Sulfa (Sulfonamide Antibiotics) EDEMA     Full body swelling; eye swelling; itchiness       Pertinent Labs  CBC w diff  CBC with Diff Latest Ref Rng & Units 06/25/2021 06/18/2021 06/12/2021 06/04/2021 05/28/2021   WBC 4.5 - 11.0 K/UL 5.5 5.40 5.1 5.1 5.1   RBC 4.0 - 5.0 M/UL 3.62(L) 4.01 3.86(L) 3.86(L) 3.38(L)   HGB 12.0 - 15.0 GM/DL 11.2(L) 12.1 12.3 12.3 10.5(L)   HCT 36 - 45 % 34.4(L) 38.3 36.6 37.4 32.1(L)   MCV 80 - 100 FL 94.9 95.5(H) 94.9 96.9 95.1   MCH 26 - 34 PG 31.0 30.2 32.0 31.8 31.1   MCHC 32.0 - 36.0 G/DL 16.1 31.6(L) 33.7 32.8 32.7   RDW 11 - 15 % 13.9 - 14.2 15.0 14.4   PLT 150 - 400 K/UL 238 268 266 238 210   MPV 7 - 11 FL 8.5 9.8 7.9 7.8 7.9   NEUT 41 - 77 % 79(H) 75.5(H) 73 76 79(H)   ANC 1.8 - 7.0 K/UL 4.37 4.08 3.77 3.85 4.03   LYMA 24 - 44 % 16(L) - 19(L) 18(L) 16(L)   ALYM 1.0 - 4.8 K/UL 0.88(L) 0.99(L) 0.94(L) 0.92(L) 0.80(L)   MONA 4 - 12 % 3(L) - 5 2(L) 3(L)   AMONO 0 - 0.80 K/UL 0.17 0.22(L) 0.24 0.12 0.16   EOSA 0 - 5 % 1 - 1 3 1    AEOS 0 - 0.45 K/UL 0.04 0.03(L) 0.03 0.15 0.06   BASA 0 - 2 % 1 - 2 1 1    ABAS 0 - 0.20 K/UL 0.04 0.05 0.08 0.06 0.04       Comprehensive Metabolic Profile  CMP Latest Ref Rng & Units 07/02/2021 06/25/2021 06/18/2021 06/12/2021 06/04/2021   NA 136 - 145 mmol/L 135(L) 139 135(L) 137 139   K - 4.7 4.5 4.5 4.4 4.3   CL - 103 105 105 105 104   CO2 - 24.0 26 23.0 23 24   GAP - 8 8 7(L) 9 11   BUN 7.0 - 18.7 22.0(H) 18 16.0 18 21   CR 0.57 - 1.11 mg/dL 0.96(E) 4.54(U) 9.81(X) 1.17(H) 1.03(H)   GLUX 70 - 100 MG/DL - 914(N) - 829(F) 621(H)   CA - 9.4 9.2 9.3 9.8 10.1   TP - 7.2 6.8 7.2 7.8 7.6   ALB - 4.3 4.0 4.0 4.5 4.5   ALKP - 87 75 92 96 101   ALT - 14 11 12 21 18    TBILI mg/dL 0.86 0.3 5.78 0.4 0.6   GFR >60 mL/min - - - - -   GFRAA >60 mL/min - - - - -       LOG10  CMV   Date Value Ref Range Status   06/25/2021 <1.7  Final     CMV DNA Quant PCR   Date Value Ref Range Status   06/25/2021 CMV DNA NOT DETECTED CMVND-CMV DNA NOT DETECTED [IU]/mL Final       Weight:  Current Actual Body Weight in Kilograms: 90.3  Ideal Body Weight in Kilograms: 59.25  Adjusted Body Weight in Kilograms: 71.66    Renal Function:  Calculated creatinine clearance based on Cockcroft-Gault Equation based on IBW: 50.8 ml/min  Calculated creatinine clearance based on Cockcroft-Gault Equation based on Adjusted  Body Weight: 61.7 ml/min  Calculated creatinine clearance based on Cockcroft-Gault Equation based on Actual Body Weight: >70 ml/min    eGFR based on MDRD: 43.22      Assessment/Plan    Infection    CMV  Status: D+/R+ (verified in UNet)  Regimen: valganciclovir 900mg  daily for 3 months until 08/07/2021 for prophylaxis Follow-up labs due according to standard follow-up protocol.    Monitoring: Renal function is worsened. Thrombocytopenia is not present. Leukopenia is not present. CMV viremia is not present.  Dose evaluation: The current dose is appropriate. Continue Valcyte 900mg  daily      Education   Education was not provided.        Lonzo Cloud, PHARMD

## 2021-07-04 ENCOUNTER — Encounter: Admit: 2021-07-04 | Discharge: 2021-07-04 | Payer: MEDICARE

## 2021-07-04 DIAGNOSIS — Z940 Kidney transplant status: Secondary | ICD-10-CM

## 2021-07-04 DIAGNOSIS — D849 Immunodeficiency, unspecified: Secondary | ICD-10-CM

## 2021-07-04 DIAGNOSIS — Z79899 Other long term (current) drug therapy: Secondary | ICD-10-CM

## 2021-07-04 LAB — TACROLIMUS LC-MS/MS: TACROLIMUS LC-MS/MS: 8.6 ug/L (ref 5.0–20.0)

## 2021-07-06 ENCOUNTER — Encounter: Admit: 2021-07-06 | Discharge: 2021-07-06 | Payer: MEDICARE

## 2021-07-08 ENCOUNTER — Encounter: Admit: 2021-07-08 | Discharge: 2021-07-08 | Payer: MEDICARE

## 2021-07-08 DIAGNOSIS — D849 Immunodeficiency, unspecified: Secondary | ICD-10-CM

## 2021-07-08 DIAGNOSIS — Z940 Kidney transplant status: Secondary | ICD-10-CM

## 2021-07-08 DIAGNOSIS — Z79899 Other long term (current) drug therapy: Secondary | ICD-10-CM

## 2021-07-08 LAB — CMV QUANT PCR-BLOOD

## 2021-07-08 MED ORDER — BIOTIN 5 MG PO TAB
5 mg | ORAL_TABLET | Freq: Every day | ORAL | 3 refills | 30.00000 days | Status: AC
Start: 2021-07-08 — End: ?

## 2021-07-08 NOTE — Telephone Encounter
Patient sent incoming mychart message about hair loss and requesting biotin prescription. Prescription sent to local pharmacy. Patient also reports decreased urine output. TC to patient and support person Tess with no answer. Mychart message sent asking patient to measure urine output.

## 2021-07-09 ENCOUNTER — Encounter: Admit: 2021-07-09 | Discharge: 2021-07-09 | Payer: MEDICARE

## 2021-07-09 ENCOUNTER — Ambulatory Visit: Admit: 2021-07-09 | Discharge: 2021-07-09 | Payer: MEDICARE

## 2021-07-09 DIAGNOSIS — Z940 Kidney transplant status: Secondary | ICD-10-CM

## 2021-07-09 DIAGNOSIS — Z79899 Other long term (current) drug therapy: Secondary | ICD-10-CM

## 2021-07-09 DIAGNOSIS — D849 Immunodeficiency, unspecified: Secondary | ICD-10-CM

## 2021-07-09 LAB — COMPREHENSIVE METABOLIC PANEL
POTASSIUM: 4.6 MMOL/L (ref 3.5–5.1)
SODIUM: 138 MMOL/L (ref 137–147)

## 2021-07-09 LAB — URINALYSIS MICROSCOPIC REFLEX TO CULTURE

## 2021-07-09 LAB — CBC AND DIFF
HEMATOCRIT: 32 % — ABNORMAL LOW (ref 36–45)
HEMOGLOBIN: 11 g/dL — ABNORMAL LOW (ref 12.0–15.0)
MCH: 31 pg (ref 26–34)
MCV: 94 FL (ref 80–100)
RBC COUNT: 3.4 M/UL — ABNORMAL LOW (ref 4.0–5.0)
WBC COUNT: 6.5 K/UL (ref 4.5–11.0)

## 2021-07-09 LAB — PROTEIN/CR RATIO,UR RAN
PROT CREAT RAT/CAL: 0.2 % — ABNORMAL HIGH (ref <0.15)
UR CREATININE, RAN: 83 mg/dL — ABNORMAL LOW (ref 5.0–8.0)
UR TOTAL PROTEIN,RAN: 13 mg/dL — ABNORMAL HIGH (ref 1.005–1.030)

## 2021-07-09 LAB — PHOSPHORUS: PHOSPHORUS: 4 mg/dL (ref 2.0–4.5)

## 2021-07-09 LAB — URINALYSIS DIPSTICK REFLEX TO CULTURE
NITRITE: NEGATIVE K/UL (ref 0–0.45)
URINE ASCORBIC ACID, UA: NEGATIVE
URINE BILE: NEGATIVE K/UL (ref 1.8–7.0)
URINE BLOOD: NEGATIVE K/UL — ABNORMAL LOW (ref 1.0–4.8)
URINE KETONE: NEGATIVE % (ref 0–2)

## 2021-07-09 LAB — MAGNESIUM: MAGNESIUM: 1.7 mg/dL (ref 1.6–2.6)

## 2021-07-09 LAB — URIC ACID: URIC ACID: 4.6 mg/dL (ref 2.0–7.0)

## 2021-07-09 MED FILL — WATER FOR INJECTION, STERILE IJ SOLN: 28 days supply | Qty: 10 | Fill #2 | Status: CP

## 2021-07-09 MED FILL — PENTAMIDINE 300 MG INH SOLR: 300 mg/6 mL | RESPIRATORY_TRACT | 28 days supply | Qty: 300 | Fill #2 | Status: CP

## 2021-07-09 NOTE — Progress Notes
Pentamidine 300mg  given  in outpatient clinic via [facility].  No complications.

## 2021-07-09 NOTE — Progress Notes
Transplant Pharmacist Managed [facility] Follow-up    Brandy Joseph received a kidney transplant on 05/07/2021.    Allergies  Allergies   Allergen Reactions   ? Ketorolac RASH   ? Sulfa (Sulfonamide Antibiotics) EDEMA     Full body swelling; eye swelling; itchiness       Pertinent Labs  CBC w diff  CBC with Diff Latest Ref Rng & Units 07/09/2021 06/25/2021 06/18/2021 06/12/2021 06/04/2021   WBC 4.5 - 11.0 K/UL 6.5 5.5 5.40 5.1 5.1   RBC 4.0 - 5.0 M/UL 3.47(L) 3.62(L) 4.01 3.86(L) 3.86(L)   HGB 12.0 - 15.0 GM/DL 11.0(L) 11.2(L) 12.1 12.3 12.3   HCT 36 - 45 % 32.9(L) 34.4(L) 38.3 36.6 37.4   MCV 80 - 100 FL 94.9 94.9 95.5(H) 94.9 96.9   MCH 26 - 34 PG 31.6 31.0 30.2 32.0 31.8   MCHC 32.0 - 36.0 G/DL 28.4 13.2 31.6(L) 33.7 32.8   RDW 11 - 15 % 14.4 13.9 - 14.2 15.0   PLT 150 - 400 K/UL 195 238 268 266 238   MPV 7 - 11 FL 8.5 8.5 9.8 7.9 7.8   NEUT 41 - 77 % 81(H) 79(H) 75.5(H) 73 76   ANC 1.8 - 7.0 K/UL 5.26 4.37 4.08 3.77 3.85   LYMA 24 - 44 % 13(L) 16(L) - 19(L) 18(L)   ALYM 1.0 - 4.8 K/UL 0.86(L) 0.88(L) 0.99(L) 0.94(L) 0.92(L)   MONA 4 - 12 % 4 3(L) - 5 2(L)   AMONO 0 - 0.80 K/UL 0.28 0.17 0.22(L) 0.24 0.12   EOSA 0 - 5 % 1 1 - 1 3   AEOS 0 - 0.45 K/UL 0.05 0.04 0.03(L) 0.03 0.15   BASA 0 - 2 % 1 1 - 2 1   ABAS 0 - 0.20 K/UL 0.05 0.04 0.05 0.08 0.06       Comprehensive Metabolic Profile  CMP Latest Ref Rng & Units 07/09/2021 07/02/2021 06/25/2021 06/18/2021 06/12/2021   NA 137 - 147 MMOL/L 138 135(L) 139 135(L) 137   K 3.5 - 5.1 MMOL/L 4.6 4.7 4.5 4.5 4.4   CL 98 - 110 MMOL/L 104 103 105 105 105   CO2 21 - 30 MMOL/L 26 24.0 26 23.0 23   GAP 3 - 12 8 8 8  7(L) 9   BUN 7 - 25 MG/DL 20 22.0(H) 18 16.0 18   CR 0.4 - 1.00 MG/DL 4.40(N) 0.27(O) 5.36(U) 1.20(H) 1.17(H)   GLUX 70 - 100 MG/DL 440(H) - 474(Q) - 595(G)   CA 8.5 - 10.6 MG/DL 9.6 9.4 9.2 9.3 9.8   TP 6.0 - 8.0 G/DL 7.0 7.2 6.8 7.2 7.8   ALB 3.5 - 5.0 G/DL 4.2 4.3 4.0 4.0 4.5   ALKP 25 - 110 U/L 69 87 75 92 96   ALT 7 - 56 U/L 12 14 11 12 21    TBILI 0.3 - 1.2 MG/DL 0.3 3.87 0.3 5.64 0.4   GFR >60 mL/min - - - - -   GFRAA >60 mL/min - - - - -       LOG10  CMV   Date Value Ref Range Status   06/25/2021 <1.7  Final     CMV DNA Quant PCR   Date Value Ref Range Status   07/02/2021 NOT DETECTED NOT DETECTED Log IU/mL Final       Weight:  Current Actual Body Weight in Kilograms: 90.9  Ideal Body Weight in Kilograms: 59.25  Adjusted Body Weight in  Kilograms: 71.92    Renal Function:  Calculated creatinine clearance based on Cockcroft-Gault Equation based on IBW: 60.107ml/min  Calculated creatinine clearance based on Cockcroft-Gault Equation based on Adjusted Body Weight: 73.7 ml/min  Calculated creatinine clearance based on Cockcroft-Gault Equation based on Actual Body Weight: >70 ml/min    eGFR based on MDRD: 53.06      Assessment/Plan    Infection    CMV  Status: D+/R+ (verified in UNet)  Regimen: valganciclovir 900mg  daily for 3 months until 08/07/2021 for prophylaxis Follow-up labs due according to standard follow-up protocol.    Monitoring: Renal function is improved. Thrombocytopenia is not present. Leukopenia is not present. CMV viremia is not present.  Dose evaluation: The current dose is appropriate. Continue Valcyte 900mg  daily    PJP Prophylaxis  Regimen: pentamidine  Regimen: last dose 07/09/2021      Education   Education was not provided.  Services provided: antimicrobial review and medication review        Lonzo Cloud, PHARMD

## 2021-07-10 ENCOUNTER — Encounter: Admit: 2021-07-10 | Discharge: 2021-07-10 | Payer: MEDICARE

## 2021-07-10 DIAGNOSIS — Z940 Kidney transplant status: Secondary | ICD-10-CM

## 2021-07-10 DIAGNOSIS — D849 Immunodeficiency, unspecified: Secondary | ICD-10-CM

## 2021-07-10 DIAGNOSIS — Z79899 Other long term (current) drug therapy: Secondary | ICD-10-CM

## 2021-07-10 LAB — BK VIRUS DNA, QUANT PLASMA: BK VIRUS PLASMA COPIES/ML: <50 — AB

## 2021-07-11 ENCOUNTER — Encounter: Admit: 2021-07-11 | Discharge: 2021-07-11 | Payer: MEDICARE

## 2021-07-11 DIAGNOSIS — R413 Other amnesia: Secondary | ICD-10-CM

## 2021-07-11 DIAGNOSIS — I639 Cerebral infarction, unspecified: Secondary | ICD-10-CM

## 2021-07-11 DIAGNOSIS — N289 Disorder of kidney and ureter, unspecified: Secondary | ICD-10-CM

## 2021-07-11 DIAGNOSIS — I10 Essential (primary) hypertension: Secondary | ICD-10-CM

## 2021-07-11 DIAGNOSIS — K3184 Gastroparesis: Secondary | ICD-10-CM

## 2021-07-11 DIAGNOSIS — E119 Type 2 diabetes mellitus without complications: Secondary | ICD-10-CM

## 2021-07-11 DIAGNOSIS — B348 Other viral infections of unspecified site: Secondary | ICD-10-CM

## 2021-07-13 ENCOUNTER — Encounter: Admit: 2021-07-13 | Discharge: 2021-07-13 | Payer: MEDICARE

## 2021-07-14 ENCOUNTER — Encounter: Admit: 2021-07-14 | Discharge: 2021-07-14 | Payer: MEDICARE

## 2021-07-14 MED FILL — MYCOPHENOLATE SODIUM 180 MG PO TBEC: 180 mg | ORAL | 30 days supply | Qty: 240 | Fill #3 | Status: AC

## 2021-07-14 NOTE — Telephone Encounter
TC to patient who reports her fasting BS this am was 195 however reports that over the weekend was in the 400s. Patient was instructed by the endocrine team from [facility] to proceed to the ER for evaluation on Friday and she refused. Saturday patient gave herself an injection of insulin on top of her basal insulin rate (without orders) and now blood sugars are better. Patient has appt with Dr. Monia Pouch 07/15/2021.     Per support person Tess, patient was repeated encouraged to proceed to ED multiple times and refused to go. Patient getting new CGM monitor and education tomorrow.

## 2021-07-15 ENCOUNTER — Encounter: Admit: 2021-07-15 | Discharge: 2021-07-15 | Payer: MEDICARE

## 2021-07-16 ENCOUNTER — Encounter: Admit: 2021-07-16 | Discharge: 2021-07-16 | Payer: MEDICARE

## 2021-07-16 DIAGNOSIS — Z79899 Other long term (current) drug therapy: Secondary | ICD-10-CM

## 2021-07-16 DIAGNOSIS — Z940 Kidney transplant status: Secondary | ICD-10-CM

## 2021-07-16 DIAGNOSIS — D849 Immunodeficiency, unspecified: Secondary | ICD-10-CM

## 2021-07-16 LAB — CBC AND DIFF
ABSOLUTE BASO COUNT: 0
ABSOLUTE EOS COUNT: 0 — ABNORMAL LOW (ref 0.04–0.54)
ABSOLUTE LYMPH COUNT: 1.2
ABSOLUTE MONO COUNT: 0.2
ABSOLUTE NEUTROPHIL: 6
BASOPHILS: 0.6
EOSINOPHIL %: 0.4 — ABNORMAL LOW (ref 0.7–7.0)
HEMATOCRIT: 36
HEMOGLOBIN: 11
LYMPHOCYTES: 16 — ABNORMAL LOW (ref 19.3–53.1)
MCH: 29
MCHC: 32 — ABNORMAL LOW (ref 32.2–35.5)
MCV: 92
MONOCYTES %: 3.2 — ABNORMAL LOW (ref 4.7–12.5)
MPV: 10
NEUTROPHILS %: 78 — ABNORMAL HIGH (ref 34.0–71.1)
PLATELET COUNT: 240
RBC COUNT: 3.9 — ABNORMAL LOW (ref 3.93–5.22)
RDW: 13
WBC COUNT: 7.7

## 2021-07-16 LAB — URINALYSIS MICROSCOPIC REFLEX TO CULTURE

## 2021-07-16 LAB — URINALYSIS DIPSTICK REFLEX TO CULTURE
LEUKOCYTES: NEGATIVE
NITRITE: NEGATIVE
URINE BILE: NEGATIVE
URINE KETONE: NEGATIVE
UROBILINOGEN: 0.2

## 2021-07-17 ENCOUNTER — Encounter: Admit: 2021-07-17 | Discharge: 2021-07-17 | Payer: MEDICARE

## 2021-07-17 DIAGNOSIS — Z79899 Other long term (current) drug therapy: Secondary | ICD-10-CM

## 2021-07-17 DIAGNOSIS — Z940 Kidney transplant status: Secondary | ICD-10-CM

## 2021-07-17 DIAGNOSIS — D849 Immunodeficiency, unspecified: Secondary | ICD-10-CM

## 2021-07-17 LAB — URIC ACID: URIC ACID: 4.8 mg/dL (ref 2.8–8.0)

## 2021-07-17 LAB — COMPREHENSIVE METABOLIC PANEL
ALBUMIN: 4.2
ALK PHOSPHATASE: 79 U/L (ref 40–150)
ALT: 17 U/L (ref 0–55)
ANION GAP: 7 meq/L — ABNORMAL LOW
AST: 10 U/L (ref 5–34)
BLD UREA NITROGEN: 18 mg/dL (ref 17.0–18.7)
CALCIUM: 9.9 mg/dL (ref 8.4–10.2)
CHLORIDE: 106 mmol/L (ref 98–107)
CO2: 22 mmol/L (ref 22–29)
CREATININE: 1 mL/Min/1.73
EGFR: 62 mL/Min/1.73 (ref >59)
GLUCOSE,RANDOM: 229 — ABNORMAL HIGH (ref 70–105)
POTASSIUM: 4.2 mmol/L (ref 3.5–5.1)
SODIUM: 135 mmol/L — ABNORMAL LOW (ref 136–145)
TOTAL BILIRUBIN: 0.4 mg/dL (ref 0.2–1.2)
TOTAL PROTEIN: 7.6 g/dL (ref 6.4–8.3)

## 2021-07-17 LAB — MAGNESIUM: MAGNESIUM: 2 mg/dL (ref 1.6–2.6)

## 2021-07-17 LAB — PHOSPHORUS: PHOSPHORUS: 2.6 mg/dL

## 2021-07-17 MED ORDER — CLONIDINE HCL 0.2 MG PO TAB
.2 mg | ORAL_TABLET | Freq: Three times a day (TID) | ORAL | 2 refills | Status: AC
Start: 2021-07-17 — End: ?
  Filled 2021-07-17: qty 90, 30d supply, fill #1

## 2021-07-17 MED FILL — MYCOPHENOLATE SODIUM 180 MG PO TBEC: 180 mg | ORAL | 30 days supply | Qty: 240 | Fill #3 | Status: CP

## 2021-07-17 MED FILL — VALGANCICLOVIR 450 MG PO TAB: 450 mg | ORAL | 58 days supply | Qty: 116 | Fill #2 | Status: CP

## 2021-07-17 MED FILL — CEPHALEXIN 500 MG PO CAP: 500 mg | ORAL | 30 days supply | Qty: 60 | Fill #4 | Status: CP

## 2021-07-17 NOTE — Progress Notes
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    Allergies  Allergies   Allergen Reactions   ? Ketorolac RASH   ? Sulfa (Sulfonamide Antibiotics) EDEMA     Full body swelling; eye swelling; itchiness       Pertinent Labs  CBC w diff  CBC with Diff Latest Ref Rng & Units 07/16/2021 07/09/2021 06/25/2021 06/18/2021 06/12/2021   WBC - 7.70 6.5 5.5 5.40 5.1   RBC 3.93 - 5.22 3.92(L) 3.47(L) 3.62(L) 4.01 3.86(L)   HGB - 11.7 11.0(L) 11.2(L) 12.1 12.3   HCT - 36.4 32.9(L) 34.4(L) 38.3 36.6   MCV - 92.9 94.9 94.9 95.5(H) 94.9   MCH - 29.8 31.6 31.0 30.2 32.0   MCHC 32.2 - 35.5 32.1(L) 33.3 32.6 31.6(L) 33.7   RDW - 13.0 14.4 13.9 - 14.2   PLT - 240 195 238 268 266   MPV - 10.0 8.5 8.5 9.8 7.9   NEUT 34.0 - 71.1 78.9(H) 81(H) 79(H) 75.5(H) 73   ANC - 6.07 5.26 4.37 4.08 3.77   LYMA 24 - 44 % - 13(L) 16(L) - 19(L)   ALYM - 1.27 0.86(L) 0.88(L) 0.99(L) 0.94(L)   MONA 4 - 12 % - 4 3(L) - 5   AMONO - 0.25 0.28 0.17 0.22(L) 0.24   EOSA 0 - 5 % - 1 1 - 1   AEOS 0.04 - 0.54 0.03(L) 0.05 0.04 0.03(L) 0.03   BASA 0 - 2 % - 1 1 - 2   ABAS - 0.05 0.05 0.04 0.05 0.08       Comprehensive Metabolic Profile  CMP Latest Ref Rng & Units 07/16/2021 07/09/2021 07/02/2021 06/25/2021 06/18/2021   NA 136 - 145 mmol/L 135(L) 138 135(L) 139 135(L)   K 3.5 - 5.1 mmol/L 4.2 4.6 4.7 4.5 4.5   CL 98 - 107 mmol/L 106 104 103 105 105   CO2 22 - 29 mmol/L 22.0 26 24.0 26 23.0   GAP meq/L 7(L) 8 8 8  7(L)   BUN 17.0 - 18.7 mg/dL 95.2 20 22.0(H) 18 16.0   CR mL/Min/1.73 1.03 1.13(H) 1.35(H) 1.08(H) 1.20(H)   GLUX 70 - 100 MG/DL - 841(L) - 244(W) -   CA 8.4 - 10.2 mg/dL 9.9 9.6 9.4 9.2 9.3   TP 6.4 - 8.3 g/dL 7.6 7.0 7.2 6.8 7.2   ALB - 4.2 4.2 4.3 4.0 4.0   ALKP 40 - 150 U/L 79 69 87 75 92   ALT 0 - 55 U/L 17 12 14 11 12    TBILI 0.2 - 1.2 mg/dL 1.02 0.3 7.25 0.3 3.66   GFR >60 mL/min - - - - -   GFRAA >60 mL/min - - - - -       LOG10  CMV   Date Value Ref Range Status   07/09/2021 <1.7  Final CMV DNA Quant PCR   Date Value Ref Range Status   07/09/2021 CMV DNA NOT DETECTED CMVND-CMV DNA NOT DETECTED [IU]/mL Final       Weight:  Current Actual Body Weight in Kilograms: 90.9  Ideal Body Weight in Kilograms: 59.25  Adjusted Body Weight in Kilograms: 71.92    Renal Function:  Calculated creatinine clearance based on Cockcroft-Gault Equation based on IBW: 66.84ml/min  Calculated creatinine clearance based on Cockcroft-Gault Equation based on Adjusted Body Weight: 80.35ml/min  Calculated creatinine clearance based on Cockcroft-Gault Equation based on Actual Body Weight: >100 ml/min    eGFR based  on MDRD: 59.05      Assessment/Plan    Infection    CMV  Status: D+/R+ (verified in UNet)  Regimen: valganciclovir 900mg  daily for 3 months until 08/07/2021 for prophylaxis Follow-up labs due according to standard follow-up protocol.    Monitoring: Renal function is improved. Thrombocytopenia is not present. Leukopenia is not present. CMV viremia is not present.  Dose evaluation: The current dose is appropriate. Continue Valcyte 900mg  daily    PJP Prophylaxis  Regimen: pentamidine  Regimen: last dose 07/09/2021      Education   Education was not provided.        Lonzo Cloud, PHARMD

## 2021-07-18 ENCOUNTER — Encounter: Admit: 2021-07-18 | Discharge: 2021-07-18 | Payer: MEDICARE

## 2021-07-18 DIAGNOSIS — Z940 Kidney transplant status: Secondary | ICD-10-CM

## 2021-07-18 MED ORDER — TACROLIMUS 1 MG PO CAP
5 mg | ORAL_CAPSULE | Freq: Two times a day (BID) | ORAL | 3 refills
Start: 2021-07-18 — End: ?

## 2021-07-22 ENCOUNTER — Encounter: Admit: 2021-07-22 | Discharge: 2021-07-22 | Payer: MEDICARE

## 2021-07-22 DIAGNOSIS — Z79899 Other long term (current) drug therapy: Secondary | ICD-10-CM

## 2021-07-22 DIAGNOSIS — D849 Immunodeficiency, unspecified: Secondary | ICD-10-CM

## 2021-07-22 DIAGNOSIS — Z940 Kidney transplant status: Secondary | ICD-10-CM

## 2021-07-22 LAB — PROTEIN/CR RATIO,UR RAN
PROT CREAT RAT/CAL: 0.1 mg/g (ref <0.2)
UR CREATININE, RAN: 143 mg/dL — ABNORMAL HIGH (ref 47–110)
UR TOTAL PROTEIN,RAN: 19 — ABNORMAL HIGH (ref 1–14)

## 2021-07-22 LAB — CMV QUANT PCR-BLOOD

## 2021-07-22 LAB — TACROLIMUS LC-MS/MS: TACROLIMUS LC-MS/MS: 5.4 % — ABNORMAL HIGH (ref 4–12)

## 2021-07-22 NOTE — Telephone Encounter
Tess called on behalf of the pt wanting to know if they should keep the appt tomorrow 1/8 at 840am. She states that the weather is to be bad tomorrow. Would like to know if they should R/S. Please call back .

## 2021-07-22 NOTE — Telephone Encounter
Patient forgot to take am medications. Advised to take medications at this time. Dr. Lyndel Safe updated.

## 2021-07-25 ENCOUNTER — Encounter: Admit: 2021-07-25 | Discharge: 2021-07-25 | Payer: MEDICARE

## 2021-07-25 DIAGNOSIS — Z940 Kidney transplant status: Secondary | ICD-10-CM

## 2021-07-25 DIAGNOSIS — Z79899 Other long term (current) drug therapy: Secondary | ICD-10-CM

## 2021-07-25 DIAGNOSIS — D849 Immunodeficiency, unspecified: Secondary | ICD-10-CM

## 2021-07-25 LAB — PHOSPHORUS: PHOSPHORUS: 3.4

## 2021-07-25 LAB — URINALYSIS MICROSCOPIC REFLEX TO CULTURE

## 2021-07-25 LAB — PROTEIN/CR RATIO,UR RAN: UR CREATININE, RAN: 262 — ABNORMAL HIGH (ref 47–110)

## 2021-07-25 LAB — COMPREHENSIVE METABOLIC PANEL
ANION GAP: 7 — ABNORMAL LOW (ref 8–16)
CREATININE: 1.1 mg/dL
POTASSIUM: 4.3
SODIUM: 140 mmol/L

## 2021-07-25 LAB — URINALYSIS DIPSTICK REFLEX TO CULTURE
GLUCOSE,UA: NEGATIVE
NITRITE: NEGATIVE
URINE BILE: NEGATIVE
URINE BLOOD: NEGATIVE
UROBILINOGEN: 0.2

## 2021-07-25 LAB — CBC AND DIFF
HEMATOCRIT: 37
MCHC: 32 — ABNORMAL HIGH (ref 7.0–18.7)
MCV: 94
NEUTROPHILS %: 74 MMOL/L — ABNORMAL HIGH (ref 34.0–71.1)
WBC COUNT: 5.9

## 2021-07-27 ENCOUNTER — Encounter: Admit: 2021-07-27 | Discharge: 2021-07-27 | Payer: MEDICARE

## 2021-07-28 ENCOUNTER — Encounter: Admit: 2021-07-28 | Discharge: 2021-07-28 | Payer: MEDICARE

## 2021-07-28 DIAGNOSIS — Z940 Kidney transplant status: Secondary | ICD-10-CM

## 2021-07-28 DIAGNOSIS — Z79899 Other long term (current) drug therapy: Secondary | ICD-10-CM

## 2021-07-28 DIAGNOSIS — D849 Immunodeficiency, unspecified: Secondary | ICD-10-CM

## 2021-07-28 LAB — TACROLIMUS LC-MS/MS: TACROLIMUS LC-MS/MS: 8.8

## 2021-07-28 LAB — CMV QUANT PCR-BLOOD

## 2021-07-28 NOTE — Progress Notes
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    Allergies  Allergies   Allergen Reactions   ? Ketorolac RASH   ? Sulfa (Sulfonamide Antibiotics) EDEMA     Full body swelling; eye swelling; itchiness       Pertinent Labs  CBC w diff  CBC with Diff Latest Ref Rng & Units 07/24/2021 07/16/2021 07/09/2021 06/25/2021 06/18/2021   WBC - 5.93 7.70 6.5 5.5 5.40   RBC - 3.99 3.92(L) 3.47(L) 3.62(L) 4.01   HGB - 12.3 11.7 11.0(L) 11.2(L) 12.1   HCT - 37.8 36.4 32.9(L) 34.4(L) 38.3   MCV - 94.7 92.9 94.9 94.9 95.5(H)   MCH - 30.8 29.8 31.6 31.0 30.2   MCHC - 32.5 32.1(L) 33.3 32.6 31.6(L)   RDW - 13.2 13.0 14.4 13.9 -   PLT - 222 240 195 238 268   MPV - 9.5 10.0 8.5 8.5 9.8   NEUT 34.0 - 71.1 74.2(H) 78.9(H) 81(H) 79(H) 75.5(H)   ANC - - 6.07 5.26 4.37 4.08   LYMA 24 - 44 % - - 13(L) 16(L) -   ALYM - - 1.27 0.86(L) 0.88(L) 0.99(L)   MONA 4 - 12 % - - 4 3(L) -   AMONO - - 0.25 0.28 0.17 0.22(L)   EOSA 0 - 5 % - - 1 1 -   AEOS 0.04 - 0.54 - 0.03(L) 0.05 0.04 0.03(L)   BASA 0 - 2 % - - 1 1 -   ABAS - - 0.05 0.05 0.04 0.05       Comprehensive Metabolic Profile  CMP Latest Ref Rng & Units 07/24/2021 07/16/2021 07/09/2021 07/02/2021 06/25/2021   NA mmol/L 140 135(L) 138 135(L) 139   K - 4.3 4.2 4.6 4.7 4.5   CL - 107 106 104 103 105   CO2 - 26.0 22.0 26 24.0 26   GAP 8 - 16 7(L) 7(L) 8 8 8    BUN 7.0 - 18.7 20.0(H) 18.0 20 22.0(H) 18   CR mg/dl 1.61 0.96 0.45(W) 0.98(J) 1.08(H)   GLUX 70 - 100 MG/DL - - 191(Y) - 782(N)   CA - 9.3 9.9 9.6 9.4 9.2   TP - 7.1 7.6 7.0 7.2 6.8   ALB - 4.1 4.2 4.2 4.3 4.0   ALKP - 72 79 69 87 75   ALT - 15 17 12 14 11    TBILI mg/dl 5.62 1.30 0.3 8.65 0.3   GFR >60 mL/min - - - - -   GFRAA >60 mL/min - - - - -       LOG10  CMV   Date Value Ref Range Status   07/09/2021 <1.7  Final     CMV DNA Quant PCR   Date Value Ref Range Status   07/24/2021 NOT DETECTED NOT DETECTED Log IU/mL Final       Weight:  Current Actual Body Weight in Kilograms: 90.9  Ideal Body Weight in Kilograms: 59.25  Adjusted Body Weight in Kilograms: 71.92    Renal Function:  Calculated creatinine clearance based on Cockcroft-Gault Equation based on IBW: >40ml/min  Calculated creatinine clearance based on Cockcroft-Gault Equation based on Adjusted Body Weight: >52ml/min  Calculated creatinine clearance based on Cockcroft-Gault Equation based on Actual Body Weight: >60 ml/min    eGFR based on MDRD: 54.17      Assessment/Plan    Infection    CMV  Status: D+/R+ (verified in UNet)  Regimen: valganciclovir 900mg  daily for  3 months until 08/07/2021 for prophylaxis Follow-up labs due according to standard follow-up protocol.    Monitoring: Renal function is stable. Thrombocytopenia is not present. Leukopenia is not present. CMV viremia is not present.  Dose evaluation: The current dose is appropriate. Continue Valcyte 900mg  daily    PJP Prophylaxis  Regimen: pentamidine  Regimen: last dose 07/09/2021      Education   Education was not provided.        Lonzo Cloud, PHARMD

## 2021-07-29 ENCOUNTER — Encounter: Admit: 2021-07-29 | Discharge: 2021-07-29 | Payer: MEDICARE

## 2021-07-29 MED ORDER — ERGOCALCIFEROL (VITAMIN D2) 1,250 MCG (50,000 UNIT) PO CAP
1 | ORAL_CAPSULE | ORAL | 0 refills
Start: 2021-07-29 — End: ?

## 2021-07-30 ENCOUNTER — Encounter: Admit: 2021-07-30 | Discharge: 2021-07-30 | Payer: MEDICARE

## 2021-07-30 ENCOUNTER — Ambulatory Visit: Admit: 2021-07-30 | Discharge: 2021-07-30 | Payer: MEDICARE

## 2021-07-30 DIAGNOSIS — R413 Other amnesia: Secondary | ICD-10-CM

## 2021-07-30 DIAGNOSIS — Z940 Kidney transplant status: Secondary | ICD-10-CM

## 2021-07-30 DIAGNOSIS — I10 Essential (primary) hypertension: Secondary | ICD-10-CM

## 2021-07-30 DIAGNOSIS — E1042 Type 1 diabetes mellitus with diabetic polyneuropathy: Secondary | ICD-10-CM

## 2021-07-30 DIAGNOSIS — D849 Immunodeficiency, unspecified: Secondary | ICD-10-CM

## 2021-07-30 DIAGNOSIS — Z79899 Other long term (current) drug therapy: Secondary | ICD-10-CM

## 2021-07-30 DIAGNOSIS — Z9189 Other specified personal risk factors, not elsewhere classified: Secondary | ICD-10-CM

## 2021-07-30 DIAGNOSIS — N189 Chronic kidney disease, unspecified: Secondary | ICD-10-CM

## 2021-07-30 DIAGNOSIS — B348 Other viral infections of unspecified site: Secondary | ICD-10-CM

## 2021-07-30 LAB — URINALYSIS DIPSTICK REFLEX TO CULTURE
NITRITE: NEGATIVE
URINE BILE: NEGATIVE
URINE KETONE: NEGATIVE

## 2021-07-30 LAB — THYROID STIMULATING HORMONE-TSH: TSH: 0.9 uU/mL (ref 0.35–5.00)

## 2021-07-30 LAB — URINALYSIS MICROSCOPIC REFLEX TO CULTURE

## 2021-07-30 LAB — LIPID PROFILE
CHOLESTEROL: 208 mg/dL — ABNORMAL HIGH (ref <200)
LDL: 90 mg/dL (ref <100)
NON HDL CHOLESTEROL: 130 mg/dL
TRIGLYCERIDES: 148 mg/dL (ref <150)
VLDL: 30 mg/dL

## 2021-07-30 LAB — PROTEIN/CR RATIO,UR RAN: UR CREATININE, RAN: 40 mg/dL (ref 0–0.20)

## 2021-07-30 LAB — FK506 TACROLIMUS: TACROLIMUS: 8.4 ng/mL (ref 5.0–15.0)

## 2021-07-30 LAB — IRON + BINDING CAPACITY + %SAT+ FERRITIN
% SATURATION: 42 % (ref 28–42)
FERRITIN: 352 ng/mL — ABNORMAL HIGH (ref 10–200)
IRON BINDING: 335 ug/dL (ref 270–380)
IRON: 142 ug/dL (ref 50–160)

## 2021-07-30 LAB — CBC AND DIFF
LYMPHOCYTES %: 16 % — ABNORMAL LOW (ref 24–44)
MCH: 31 pg — ABNORMAL LOW (ref 26–34)
MONOCYTES %: 5 % (ref 4–12)
MPV: 7.8 FL — ABNORMAL HIGH (ref 7–11)
NEUTROPHILS %: 77 % (ref 41–77)
RBC COUNT: 3.7 M/UL — ABNORMAL LOW (ref 4.0–5.0)
WBC COUNT: 4.5 K/UL (ref 4.5–11.0)

## 2021-07-30 LAB — MAGNESIUM: MAGNESIUM: 1.5 mg/dL — ABNORMAL LOW (ref 1.6–2.6)

## 2021-07-30 LAB — CMV QUANT PCR-BLOOD: IU/ML CMV BLOOD: <50 K/UL — ABNORMAL LOW (ref 3–12)

## 2021-07-30 LAB — PHOSPHORUS: PHOSPHORUS: 3.1 mg/dL (ref 2.0–4.5)

## 2021-07-30 LAB — COMPREHENSIVE METABOLIC PANEL
ALT: 13 U/L (ref 7–56)
AST: 14 U/L (ref 7–40)
BLD UREA NITROGEN: 16 mg/dL (ref 7–25)
POTASSIUM: 3.3 MMOL/L — ABNORMAL LOW (ref 3.5–5.1)
SODIUM: 141 MMOL/L — ABNORMAL LOW (ref 137–147)

## 2021-07-30 LAB — ALLOSURE

## 2021-07-30 LAB — HEMOGLOBIN A1C: HEMOGLOBIN A1C: 8.3 % — ABNORMAL HIGH (ref >40)

## 2021-07-30 LAB — BK VIRUS DNA, QUANT PLASMA: BK VIRUS PLASMA QUANT: 150 MMOL/L — ABNORMAL HIGH (ref 21–30)

## 2021-07-30 MED ORDER — CHOLECALCIFEROL (VITAMIN D3) 50 MCG (2,000 UNIT) PO TAB
2000 [IU] | ORAL_TABLET | Freq: Every day | ORAL | 3 refills | Status: CN
Start: 2021-07-30 — End: ?

## 2021-07-30 MED ORDER — ASPIRIN 81 MG PO TBEC
81 mg | ORAL_TABLET | Freq: Every day | ORAL | 0 refills | Status: AC
Start: 2021-07-30 — End: ?

## 2021-07-30 NOTE — Progress Notes
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    Allergies  Allergies   Allergen Reactions   ? Ketorolac RASH   ? Sulfa (Sulfonamide Antibiotics) EDEMA     Full body swelling; eye swelling; itchiness       Pertinent Labs  CBC w diff  CBC with Diff Latest Ref Rng & Units 07/30/2021 07/24/2021 07/16/2021 07/09/2021 06/25/2021   WBC 4.5 - 11.0 K/UL 4.5 5.93 7.70 6.5 5.5   RBC 4.0 - 5.0 M/UL 3.74(L) 3.99 3.92(L) 3.47(L) 3.62(L)   HGB 12.0 - 15.0 GM/DL 11.8(L) 12.3 11.7 11.0(L) 11.2(L)   HCT 36 - 45 % 35.7(L) 37.8 36.4 32.9(L) 34.4(L)   MCV 80 - 100 FL 95.4 94.7 92.9 94.9 94.9   MCH 26 - 34 PG 31.4 30.8 29.8 31.6 31.0   MCHC 32.0 - 36.0 G/DL 28.4 13.2 32.1(L) 33.3 32.6   RDW 11 - 15 % 14.4 13.2 13.0 14.4 13.9   PLT 150 - 400 K/UL 204 222 240 195 238   MPV 7 - 11 FL 7.8 9.5 10.0 8.5 8.5   NEUT 41 - 77 % 77 74.2(H) 78.9(H) 81(H) 79(H)   ANC 1.8 - 7.0 K/UL 3.42 - 6.07 5.26 4.37   LYMA 24 - 44 % 16(L) - - 13(L) 16(L)   ALYM 1.0 - 4.8 K/UL 0.72(L) - 1.27 0.86(L) 0.88(L)   MONA 4 - 12 % 5 - - 4 3(L)   AMONO 0 - 0.80 K/UL 0.23 - 0.25 0.28 0.17   EOSA 0 - 5 % 1 - - 1 1   AEOS 0 - 0.45 K/UL 0.06 - 0.03(L) 0.05 0.04   BASA 0 - 2 % 1 - - 1 1   ABAS 0 - 0.20 K/UL 0.05 - 0.05 0.05 0.04       Comprehensive Metabolic Profile  CMP Latest Ref Rng & Units 07/30/2021 07/24/2021 07/16/2021 07/09/2021 07/02/2021   NA 137 - 147 MMOL/L 141 140 135(L) 138 135(L)   K 3.5 - 5.1 MMOL/L 3.3(L) 4.3 4.2 4.6 4.7   CL 98 - 110 MMOL/L 104 107 106 104 103   CO2 21 - 30 MMOL/L 25 26.0 22.0 26 24.0   GAP 3 - 12 12 7(L) 7(L) 8 8   BUN 7 - 25 MG/DL 16 20.0(H) 18.0 20 22.0(H)   CR 0.4 - 1.00 MG/DL 4.40 1.02 7.25 3.66(Y) 1.35(H)   GLUX 70 - 100 MG/DL 40(H) - - 474(Q) -   CA 8.5 - 10.6 MG/DL 59.5 9.3 9.9 9.6 9.4   TP 6.0 - 8.0 G/DL 6.3(O) 7.1 7.6 7.0 7.2   ALB 3.5 - 5.0 G/DL 5.0 4.1 4.2 4.2 4.3   ALKP 25 - 110 U/L 74 72 79 69 87   ALT 7 - 56 U/L 13 15 17 12 14    TBILI 0.3 - 1.2 MG/DL 0.4 7.56 4.33 0.3 2.95   GFR >60 mL/min - - - - -   GFRAA >60 mL/min - - - - -       LOG10  CMV   Date Value Ref Range Status   07/09/2021 <1.7  Final     CMV DNA Quant PCR   Date Value Ref Range Status   07/24/2021 NOT DETECTED NOT DETECTED Log IU/mL Final       Weight:  Current Actual Body Weight in Kilograms: 90.9  Ideal Body Weight in Kilograms: 59.25  Adjusted Body Weight in Kilograms: 71.85  Renal Function:  Calculated creatinine clearance based on Cockcroft-Gault Equation based on IBW: >4ml/min  Calculated creatinine clearance based on Cockcroft-Gault Equation based on Adjusted Body Weight: >74ml/min  Calculated creatinine clearance based on Cockcroft-Gault Equation based on Actual Body Weight: >60 ml/min    eGFR based on MDRD: 65.62      Assessment/Plan    Infection    CMV  Status: D+/R+ (verified in [facility])  Regimen: valganciclovir 900mg  daily for 3 months until 08/07/2021 for prophylaxis Follow-up labs due according to standard follow-up protocol.    Monitoring: Renal function is stable. Thrombocytopenia is not present. Leukopenia is not present. CMV viremia is not present.  Dose evaluation: The current dose is appropriate. Continue Valcyte 900mg  daily    PJP Prophylaxis  Regimen: pentamidine  Regimen: last dose 07/09/2021      Education   Education was not provided.        Lonzo Cloud, PHARMD

## 2021-07-30 NOTE — Progress Notes
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  Donor: DCD, IRD: Yes, Age: 3s, female, Ht 5'11. Wt 215 lbs. Creatinine Initial/Terminal. ?/1.8  KDPI: 47%, cPRA:? 75%  Match: VXM Negative, PXM -ve  CMV: D+/R+  Induction: thymoglobulin  Maintenance Immunosuppression: Triple drug therapy  Post OP: good UOP post op    Referring Nephrologist:  Lorre Munroe  [address] [place] [address]  Phone: [phone]  Fax: [phone]     Dear Dr. Lorre Munroe,    We had the pleasure of meeting Brandy Joseph in the [facility] for routine evaluation of her renal transplant. She was last seen 07/09/2021. She is accompanied by her relative.     Home BPs are 120s-140s/70s with an occasional increase to 155/70s.    UO 1.8L/daily.      Has had a few episodes of diarrhea in the setting of stool softener/laxative use. Prior to transplant had constipation.    She reports compliance with her medication regimen and denies missed doses since last report on previous clinic visit. She has taken some medications late but not missed any doses.      REVIEW OF SYSTEMS: Comprehensive 14-point ROS reviewed  Positives noted in HPI otherwise negative.    Past History:  Medical History:   Diagnosis Date   ? 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 DM (diabetes mellitus) (HCC)    ? Gastroparesis    ? Hypertension    ? Kidney disease    ? Memory loss    ? Stroke [facility])        Surgical History:   Procedure Laterality Date   ? ANKLE SURGERY Left 1996   ? FIBULA FRACTURE SURGERY Left 1996   ? ESOPHAGOGASTRODUODENOSCOPY WITH BIOPSY - FLEXIBLE N/A 03/29/2018    Performed by Virgina Organ, MD at [facility] ENDO   ? ESOPHAGOGASTRODUODENOSCOPY WITH BIOPSY - FLEXIBLE N/A 10/04/2019    Performed by Everardo All, MD at [facility] ENDO   ? COLONOSCOPY WITH BIOPSY - FLEXIBLE N/A 10/04/2019    Performed by Everardo All, MD at [facility] ENDO   ? ANGIOGRAPHY CORONARY ARTERY WITH LEFT HEART CATHETERIZATION N/A 01/18/2020    Performed by Reola Mosher, MD at [facility] CATH LAB   ? POSSIBLE PERCUTANEOUS CORONARY STENT PLACEMENT WITH ANGIOPLASTY N/A 01/18/2020    Performed by Reola Mosher, MD at [facility] CATH LAB   ? COLONOSCOPY WITH BIOPSY - FLEXIBLE N/A 05/15/2020    Performed by Eliott Nine, MD at [facility] ENDO   ? COLONOSCOPY WITH SNARE REMOVAL TUMOR/ POLYP/ OTHER LESION  05/15/2020    Performed by Eliott Nine, MD at [facility] ENDO   ? ALLOTRANSPLANTATION KIDNEY FROM NON LIVING DONOR WITHOUT RECIPIENT NEPHRECTOMY N/A 05/07/2021    Performed by Nicholos Johns, MD at [facility] OR   ? BURR HOLE      release pressure from stroke   ? DIALYSIS FISTULA CREATION     ? GALLBLADDER SURGERY     ? HX CESAREAN SECTION         Social History     Socioeconomic History   ? Marital status: Divorced   ? Number of children: 1   ? Highest education level: Associate degree: occupational, Scientist, product/process development, or vocational program   Tobacco  Use   ? Smoking status: Never   ? Smokeless tobacco: Never   Vaping Use   ? Vaping Use: Never used   Substance and Sexual Activity   ? Alcohol use: Not Currently     Alcohol/week: 0.0 standard drinks     Comment: rare, socially   ? Drug use: Never   ? Sexual activity: Yes     Partners: Male     Birth control/protection: None, Surgical       Family History   Problem Relation Age of Onset   ? Stroke Mother    ? Diabetes Paternal Grandmother    ? Heart problem Paternal Grandmother    ? Hypertension Paternal Grandmother    ? Tumor Maternal Grandmother         brain   ? None Reported Son        Allergies   Allergen Reactions   ? Ketorolac RASH   ? Sulfa (Sulfonamide Antibiotics) EDEMA     Full body swelling; eye swelling; itchiness       Current Medications:    Current Outpatient Medications:   ?  albuterol sulfate (PROAIR HFA) 90 mcg/actuation HFA aerosol inhaler, Use as directed prior to pentamide treatments. Shake well before use, Disp: 6.7 g, Rfl: 0  ?  biotin 5 mg tablet, Take one tablet by mouth daily., Disp: 90 tablet, Rfl: 3  ?  bisacodyL (DULCOLAX) 10 mg rectal suppository, Insert or Apply one suppository to rectal area as directed twice daily., Disp: 4 each, Rfl: 1  ?  buPROPion XL (WELLBUTRIN XL) 150 mg tablet, Take 150 mg by mouth every morning. Do not crush or chew., Disp: , Rfl:   ?  carvediloL (COREG) 25 mg tablet, Take 1.5 tablets by mouth twice daily. Take with food., Disp: 135 tablet, Rfl: 1  ?  cephalexin (KEFLEX) 500 mg capsule, Take one capsule by mouth twice daily. Indications: infection prophylaxis, medical, Disp: 60 capsule, Rfl: 5  ?  cloNIDine (CATAPRESS) 0.2 mg tablet, Take one tablet by mouth twice daily., Disp: 60 tablet, Rfl: 1  ?  cloNIDine HCL (CATAPRES) 0.2 mg tablet, Take one tablet by mouth three times daily., Disp: 90 tablet, Rfl: 2  ?  docusate (COLACE) 100 mg capsule, Take one capsule by mouth twice daily., Disp: 180 capsule, Rfl: 1  ?  ERGOcalciferoL (vitamin D2) (DRISDOL) 1,250 mcg (50,000 unit) capsule, Take one capsule by mouth every 7 days., Disp: 12 capsule, Rfl: 0  ?  insulin lispro(+) (HUMALOG U-100 INSULIN) 100 unit/mL injection, Maximum of 50 units daily. Via insulin pump. Use 1:6 insulin to carb ratio and the following correction factor Glucose 140-180 = 1 units Glucose 181-220 = 2 units Glucose 221-260 = 3 units Glucose 261-300 = 4 units Glucose 301-350 = 5 units Glucose 351-400 = 6 units Glucose >400 = 7 units, Disp: 3 vial, Rfl: 3  ?  insulin pen needles (disposable) (BD UF NANO PEN NEEDLES) 32 gauge x 5/32 pen needle, Use one each as directed as Needed. Use with insulin injections., Disp: 300 each, Rfl: 0  ?  Insulin Syringe-Needle U-100 (BD INSULIN SYRINGE ULTRA-FINE) 1 mL 31 gauge x 5/16 syrg, Use with insulin vials as directed, Disp: 100 each, Rfl: 1  ?  magnesium oxide (MAGOX) 400 mg (241.3 mg magnesium) tablet, Take two tablets by mouth daily., Disp: 30 tablet, Rfl: 5  ?  mycophenolate sodium (MYFORTIC) 180 mg tablet,delayed release, Take four tablets by mouth twice daily., Disp: 240 tablet, Rfl: 11  ?  ondansetron (ZOFRAN ODT) 4 mg rapid dissolve tablet, Dissolve one tablet by mouth every 8 hours as needed for Nausea or Vomiting. Place on tongue to dissolve., Disp: 15 tablet, Rfl: 0  ?  pantoprazole DR (PROTONIX) 40 mg tablet, Take one tablet by mouth daily., Disp: 30 tablet, Rfl: 1  ?  pentamidine (NEBUPENT) 300 mg/6 mL nebulizer solution, Inhale 6 mL solution by nebulizer as directed every 28 days., Disp: 1 each, Rfl: 4  ?  polyethylene glycol 3350 (MIRALAX) 17 gram/dose powder, Take thirty four g by mouth twice daily., Disp: 510 g, Rfl: 1  ?  potassium & sodium phosphates 280-160-250 mg packet, Take one packet by mouth daily., Disp: 14 packet, Rfl: 0  ?  predniSONE (DELTASONE) 5 mg tablet, Take 15mg  (3 tabs) by mouth once daily on days 11/16-11/21, then take 10mg  (2 tabs) once daily on days 11/23-11/29 then take 5 mg (1 tablet) daily thereafter., Disp: 120 tablet, Rfl: 1  ?  promethazine (PHENERGAN) 12.5 mg tablet, Take one tablet by mouth every 8 hours as needed for Nausea or Vomiting., Disp: 30 tablet, Rfl: 0  ?  rosuvastatin (CRESTOR) 20 mg tablet, Take one-half tablet by mouth daily., Disp: 15 tablet, Rfl: 1  ?  senna/docusate (SENOKOT-S) 8.6/50 mg tablet, Take two tablets by mouth twice daily. Hold for loose stools, Disp: 120 tablet, Rfl: 0  ?  tacrolimus (PROGRAF) 1 mg capsule, Take three capsules by mouth twice daily., Disp: 540 capsule, Rfl: 3  ?  tamsulosin (FLOMAX) 0.4 mg capsule, Take one capsule by mouth daily after breakfast. Do not crush, chew or open capsules. Take 30 minutes following the same meal each day., Disp: 30 capsule, Rfl: 1  ?  valGANciclovir (VALCYTE) 450 mg tablet, Take two tablets by mouth daily with breakfast., Disp: 116 tablet, Rfl: 2  ?  water (sterile) for injection (STERILE WATER FOR INJECTION) injection, For use with Nebupent, Disp: 10 mL, Rfl: 4    Current Facility-Administered Medications:   ?  darbepoetin alfa (ARANESP) injection 100 mcg, 100 mcg, Subcutaneous, Q7 Days, Corena Herter, MD, 100 mcg at 05/21/21 0912    Physical Exam:  Vitals:    07/30/21 1037 07/30/21 1039   BP: (!) 151/71 (!) 149/60   BP Source: Arm, Right Upper Arm, Right Upper   Pulse: 75 82   Temp: 36.4 ?C (97.6 ?F)    SpO2: 100%    TempSrc: Oral    PainSc: Zero    Weight: 90.7 kg (200 lb)    Height: 167.6 cm (5' 6)    Body mass index is 32.28 kg/m?Marland Kitchen    General: NAD, A+Ox4, calm and pleasant. Appears to be stated age. Walker  HENT: Unremarkable, no oral lesions  Neck: Normal ROM, no LAD, no JVD  Lungs: Bilat. CTA  CV: RRR, S1, S2 without carotid bruit or murmur, no edema  Abdomen: Soft, N/D, N/T without hepatosplenomegaly  Incision: healing  M/S: Normal ROM and strength  Neuro: Nonfocal deficits without tremors  Skin: No skin rash  Psyc: Stable  Dialysis Access: LUE AVF/AVG good thrill/bruit    Laboratory studies:     CMP:  CMP Latest Ref Rng & Units 07/24/2021 07/16/2021 07/09/2021 07/02/2021 06/25/2021   NA mmol/L 140 135(L) 138 135(L) 139   K - 4.3 4.2 4.6 4.7 4.5   CL - 107 106 104 103 105   CO2 - 26.0 22.0 26 24.0 26   GAP 8 - 16 7(L) 7(L) 8 8 8    BUN 7.0 -  18.7 20.0(H) 18.0 20 22.0(H) 18   CR mg/dl 4.54 0.98 1.19(J) 4.78(G) 1.08(H)   GLUX 70 - 100 MG/DL - - 956(O) - 130(Q)   CA - 9.3 9.9 9.6 9.4 9.2   TP - 7.1 7.6 7.0 7.2 6.8   ALB - 4.1 4.2 4.2 4.3 4.0   ALKP - 72 79 69 87 75   ALT - 15 17 12 14 11    TBILI mg/dl 6.57 8.46 0.3 9.62 0.3   GFR >60 mL/min - - - - -   GFRAA >60 mL/min - - - - -     Hemoglobin A1C (%)   Date Value   05/07/2021 7.4 (H)   12/26/2020 10.5 (H)   12/21/2019 7.2 (H)     PTH Hormone (PG/ML)   Date Value   05/07/2021 396.7 (H)   03/24/2018 76.9 (H)     Lipase   Date Value Ref Range Status   05/16/2021 5 (L) 11 - 82 U/L Final   10/01/2019 31 11 - 82 U/L Final     No results found for: AMY  BK Virus Plasma Quant (no units) Date Value   07/09/2021 290 (H)   07/02/2021 <2.70 (A)   06/25/2021 82 (H)   06/12/2021     NOT DETECTED  Reference range: NOT DETECTED  Unit: IU/mL  TESTING PERFORMED AT LOW VOLUME ON PLASMA SPECIMEN FOR BKV, MAY  AFFECT RESULTS.  Marland Kitchen  Assay Range: 33 IU/mL to 3.30E+08 IU/mL  .  One IU is equal to 0.33 copies of BKV.  .  The limit of quantitation (LOQ) is 33 IU/mL. BK virus DNA detected  below the LOQ  will be reported as Detected:<33 IU/mL.  .  This test was developed and its performance characteristics  determined by  [facility]. It has not been cleared or approved by the U.[place].  Food and Drug  Administration. Results should be used in conjunction with clinical  findings,  and should not form the sole basis for a diagnosis or treatment  decision.  ____________________________________________________________  Testing Performed At:  [facility]  [address]  [place], [place] [address]  Laboratory Director: Lind Guest Ph.D., BCLD (ABB)  CLIA#: [id number]  Phone: [phone]     06/04/2021     NOT DETECTED  Reference range: NOT DETECTED  Unit: IU/mL  TESTING PERFORMED AT LOW VOLUME ON PLASMA SPECIMEN FOR BKV, MAY  AFFECT RESULTS.  Marland Kitchen  Assay Range: 33 IU/mL to 3.30E+08 IU/mL  .  One IU is equal to 0.33 copies of BKV.  .  The limit of quantitation (LOQ) is 33 IU/mL. BK virus DNA detected  below the LOQ  will be reported as Detected:<33 IU/mL.  .  This test was developed and its performance characteristics  determined by  [facility]. It has not been cleared or approved by the U.S.  Food and Drug  Administration. Results should be used in conjunction with clinical  findings,  and should not form the sole basis for a diagnosis or treatment  decision.  ____________________________________________________________  Testing Performed At:  [facility]  [address]  [place], [place] [address]  Laboratory Director: Lind Guest Ph.D., BCLD (ABB)  CLIA#: [id number]  Phone: [phone]       CMV DNA Quant PCR   Date Value   07/24/2021 NOT DETECTED Log IU/mL   07/16/2021 NOT DETECTED IU/mL   07/09/2021 CMV DNA NOT DETECTED [IU]/mL   07/02/2021 NOT DETECTED Log IU/mL   06/25/2021 CMV  DNA NOT DETECTED [IU]/mL     IU/mL CMV Blood (no units)   Date Value   07/09/2021     <50 IU/mL  The test method detects and quantitates CMV DNA using the Abbott RealTime assay,   and is approved by the FDA for monitoring hematopoietic stem cell transplant   patients who are undergoing anti-CMV therapy.  Please correlate results with the   clinical status of the patient.     06/25/2021     <50 IU/mL  The test method detects and quantitates CMV DNA using the Abbott RealTime assay,   and is approved by the FDA for monitoring hematopoietic stem cell transplant   patients who are undergoing anti-CMV therapy.  Please correlate results with the   clinical status of the patient.     06/12/2021     <50 IU/mL  The test method detects and quantitates CMV DNA using the Abbott RealTime assay,   and is approved by the FDA for monitoring hematopoietic stem cell transplant   patients who are undergoing anti-CMV therapy.  Please correlate results with the   clinical status of the patient.     06/04/2021     <50 IU/mL  The test method detects and quantitates CMV DNA using the Abbott RealTime assay,   and is approved by the FDA for monitoring hematopoietic stem cell transplant   patients who are undergoing anti-CMV therapy.  Please correlate results with the   clinical status of the patient.     05/24/2021     <50 IU/mL  The test method detects and quantitates CMV DNA using the Abbott RealTime assay,   and is approved by the FDA for monitoring hematopoietic stem cell transplant   patients who are undergoing anti-CMV therapy.  Please correlate results with the   clinical status of the patient.       BK Virus Plasma Copies/ML (no units)   Date Value   07/02/2021 <500 (A)     No results found for: EBVDNAQT    TACROLIMUS LEVEL:  No results found for: TACROLIMUS    CBC with Diff:  CBC with Diff Latest Ref Rng & Units 07/24/2021 07/16/2021   WBC - 5.93 7.70   RBC - 3.99 3.92(L)   HGB - 12.3 11.7   HCT - 37.8 36.4   MCV - 94.7 92.9   MCH - 30.8 29.8   MCHC - 32.5 32.1(L)   RDW - 13.2 13.0   PLT - 222 240   MPV - 9.5 10.0   NEUT 34.0 - 71.1 74.2(H) 78.9(H)   ANC - - 6.07   LYMA 24 - 44 % - -   ALYM - - 1.27   MONA 4 - 12 % - -   AMONO - - 0.25   EOSA 0 - 5 % - -   AEOS 0.04 - 0.54 - 0.03(L)   BASA 0 - 2 % - -   ABAS - - 0.05     Lab Results   Component Value Date/Time    IRON 92 05/09/2021 05:36 AM    TIBC 192 (L) 05/09/2021 05:36 AM    PSAT 48 (H) 05/09/2021 05:36 AM    FERRITIN 1,156 (H) 05/09/2021 05:36 AM    FERRITIN 225 (H) 10/04/2019 05:08 AM       Urinalysis:  Lab Results   Component Value Date/Time    UCOLOR Yellow 07/24/2021 10:50 AM    TURBID Clear 07/24/2021 10:50 AM    USPGR 1.025  07/24/2021 10:50 AM    UPH 6.0 07/24/2021 10:50 AM    UPROTEIN Trace (A) 07/24/2021 10:50 AM    UAGLU Negative 07/24/2021 10:50 AM    UKET Trace (A) 07/24/2021 10:50 AM    UBILE Negative 07/24/2021 10:50 AM    UBLD Negative 07/24/2021 10:50 AM    UROB 0.2 07/24/2021 10:50 AM     Protein/CR ratio   Date Value   07/24/2021 0.12628   07/16/2021 0.13896 mg/g   07/09/2021 0.2 (H)   07/02/2021 0.14979   06/25/2021 0.2 (H)       Imaging:  Results for orders placed during the hospital encounter of 05/16/21    CT ABD/PELV WO CONTRAST    Impression  Interval renal transplantation without hydronephrosis or significant peritransplant fluid collection. No bowel obstruction is identified.      Finalized by Prince Rome, D.O. on 05/16/2021 3:43 PM. Dictated by Prince Rome, D.O. on 05/16/2021 3:34 PM.    Results for orders placed during the hospital encounter of 05/16/21    LINE PLCMT 1V CXR    Impression  Right IJ central venous catheter placement as described. No acute cardiopulmonary abnormality.      Finalized by Golda Acre, M.D. on 05/17/2021 11:10 AM. Dictated by Golda Acre, M.D. on 05/17/2021 11:10 AM.    Patient Active Problem List    Diagnosis Date Noted   ? 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 Constipation 05/16/2021   ? Immunosuppression (HCC) 05/08/2021   ? Kidney transplant recipient 05/07/2021   ? Seasonal allergies 10/25/2020   ? ESRD (end stage renal disease) on dialysis (HCC) 12/21/2019   ? Pre-transplant evaluation for kidney and pancreas transplant 12/21/2019   ? Retention of urine 12/07/2019   ? Pulmonary infiltrates 10/26/2019   ? Anemia of chronic disease 10/25/2019   ? Esophageal candidiasis (HCC) 10/25/2019   ? AKI (acute kidney injury) (HCC) 09/28/2019   ? Hyperkalemia 04/26/2018   ? Vitreous hemorrhage of left eye (HCC) 04/11/2018   ? Cognitive impairment 04/11/2018   ? Neurogenic bladder 04/06/2018   ? Impaired transfers 04/06/2018   ? Intraventricular hemorrhage (HCC) 04/06/2018   ? Delirium 03/24/2018   ? Hypertensive emergency 03/18/2018   ? Renal insufficiency 03/18/2018   ? Pain of left hand 10/17/2015   ? Entrapment of right ulnar nerve 04/09/2015   ? Carpal tunnel syndrome of right wrist 04/09/2015   ? 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 Abnormal MRI, thoracic spine 12/06/2014   ? Type 1 diabetes mellitus with ketoacidosis without coma (HCC) 10/12/2014   ? Essential hypertension 10/12/2014   ? Diabetic retinopathy associated with type 1 diabetes mellitus (HCC) 10/12/2014   ? Neuropathic pain of right lower extremity 10/12/2014   ? Gait abnormality 10/12/2014   ? Impaired mobility and ADLs 10/12/2014   ? Sensory deficit, right 10/08/2014   ? Leg weakness 10/06/2014       Assessment and Plan:  Brandy Joseph is a 41 y.o. 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   ?  #) Immunosuppression - On maintenance Triple drug therapy  - Goal tacrolimus trough first 3 months, level therap today at 8.4 (of note pt has missed some doses or late doses, none in the past week?)  - Prograf 3 mg BID, MPA 540 mg BID-(BK) and steroid pulse to wean to prednisone 5 mg  daily  - No recent missed doses or late doses since last week  ?  #) Deceased donor renal transplant: bl Cr TBD mg/dL, bl UPCR TBD gm/gm (pre Tx UPCR bl 1.3 gm/gm)   - Cr improving, 0.94 mg/dl today  - [place] - adnexal vs lymphocele - improved   ?  #) ID Proph  - CMV Donor(+)/ Recipient(+) to treat with Valcyte for 3 months post transplant  - Keflex for urinary prophylaxis, pentamidine for PJP. Dose rec'd today  - the patient has Hx of recurrent UTI and Hx of neurogenic bladder.  - BK 290 international unit(s)/ml as of 07/09/2021  ?  #) BPs:   - Coreg 37.5mg  BID, Clonidine 0.2 mg bid  ?  #) Diabetes  - BGs have been labile on BMPs  - on insulin pump  - HgA1C 8.3% today  - follows with Endocrinology  ?  #) Hx of CVA   - with residual memory loss and RLE weakness.  - start baby ASA/ cont Rosuvastatin  ?  #) Anemia - Hgb average of 10-12s  - at goal, monitor   - Iron studies: Iron sufficient on 05/09/2021  ?  #) CKD-MBD  - PTH?396 on 05/07/2021  - Vitamin D?16.5 on 11/2 - Completed 25-Vit.D 50 000 unit weekly. Start 2000 international unit(s) once daily  - Ca acceptable  - PO4 acceptable- d/c phos NaK    #) Memory issues  - unable to remember meds, timings etc  - uses phone alarm    #) Anxiety/depression  - Cont Wellbutrin XL    #) General health maint. Managed by PCP   Skin cancer surveillance: Annual follow up with Dermatology for skin eval due to long term immunosuppression   Cancer screening: Mammogram, Pap Smear, colonoscopy per guidelines   Annual high dose flu vaccine    Avoid all live vaccines. When clinically indicated or age appropriate, can receive the Shingrix vaccine one year post transplant.      Vaccinated for COVID-19 [Moderna] x 3 doses    RTC in 4 weeks   Labs weekly     Sincerely,    Alessandra Bevels, APRN-NP  Transplant Nephrology Nurse Practitioner    Cc: Adrick Kestler Alderson  Cc: Christene Lye Darland    Please contact the [facility] at [phone] for any transplant related questions or concerns that may arise.      Total time minutes: 40  Reviewed data prior to visit: 10 minutes  Face to face time spent with patient:  20 minutes   After visit time spent on documentation and coordination of care: 10 minutes   Documentation review includes review of past medical history, Nephrology notes, interpretation of laboratory results  Face to face time included counseling and education patient and family/caregiver, examination, communicated laboratory results, care coordination

## 2021-07-30 NOTE — Patient Instructions
Return to see Dr. Lyndel Safe in 1 month    Labs weekly

## 2021-07-30 NOTE — Telephone Encounter
Reviewed labs from 07/30/2021 at this time    Hgb 11.8  WBC 4.5  K+ 3.3  Creatinine 0.94  Mg 1.5  Phos 3.1  Tacrolimus  8.4   UPC 0.2  Iron 142  % sat 42  TIBC 335  Ferritin 352  A1C 8.3   PTH 54.6  TSH 0.98  Cholesterol 208  Triglycerides 148  HDL 78  LDL 90    TC to Dr. Al-Mubaslat's office who voiced concern that patient's memory is worse and that patient is not following directions. Tess; support person, has also voiced concern of patient's increasing memory issues/missing medications, compliance with insulin regime.     Received the following orders from Dr. Lyndel Safe :  Increase potassium in diet   No dose change  Neuropsych testing (TC to [facility] neuropsych office to expedite referral)     Reviewed labs with patient at this time via mychart.

## 2021-08-01 ENCOUNTER — Encounter: Admit: 2021-08-01 | Discharge: 2021-08-01 | Payer: MEDICARE

## 2021-08-02 ENCOUNTER — Encounter: Admit: 2021-08-02 | Discharge: 2021-08-02 | Payer: MEDICARE

## 2021-08-04 ENCOUNTER — Encounter: Admit: 2021-08-04 | Discharge: 2021-08-04 | Payer: MEDICARE

## 2021-08-04 DIAGNOSIS — D849 Immunodeficiency, unspecified: Secondary | ICD-10-CM

## 2021-08-04 DIAGNOSIS — Z940 Kidney transplant status: Secondary | ICD-10-CM

## 2021-08-04 DIAGNOSIS — Z79899 Other long term (current) drug therapy: Secondary | ICD-10-CM

## 2021-08-04 LAB — BK VIRUS DNA, QUANT PLASMA

## 2021-08-05 ENCOUNTER — Encounter: Admit: 2021-08-05 | Discharge: 2021-08-05 | Payer: MEDICARE

## 2021-08-05 ENCOUNTER — Observation Stay: Admit: 2021-08-05 | Discharge: 2021-08-05 | Payer: MEDICARE

## 2021-08-05 ENCOUNTER — Emergency Department: Admit: 2021-08-05 | Discharge: 2021-08-05 | Payer: MEDICARE

## 2021-08-05 DIAGNOSIS — E1065 Type 1 diabetes mellitus with hyperglycemia: Secondary | ICD-10-CM

## 2021-08-05 DIAGNOSIS — R519 Acute nonintractable headache, unspecified headache type: Secondary | ICD-10-CM

## 2021-08-05 DIAGNOSIS — Z940 Kidney transplant status: Secondary | ICD-10-CM

## 2021-08-05 DIAGNOSIS — M791 Myalgia, unspecified site: Secondary | ICD-10-CM

## 2021-08-05 DIAGNOSIS — R5381 Other malaise: Secondary | ICD-10-CM

## 2021-08-05 LAB — CBC AND DIFF
ABSOLUTE BASO COUNT: 0 K/UL (ref 0–0.20)
ABSOLUTE EOS COUNT: 0 K/UL (ref 0–0.45)
ABSOLUTE MONO COUNT: 0.2 K/UL (ref 0–0.80)
MDW (MONOCYTE DISTRIBUTION WIDTH): 19 (ref <20.7)
WBC COUNT: 7.2 K/UL (ref 4.5–11.0)

## 2021-08-05 LAB — MAGNESIUM
MAGNESIUM: 1.8 mg/dL — ABNORMAL LOW (ref 1.6–2.6)
MAGNESIUM: 1.6 mg/dL (ref 1.6–2.6)

## 2021-08-05 LAB — RVP VIRAL PANEL PCR

## 2021-08-05 LAB — URINALYSIS DIPSTICK REFLEX TO CULTURE
NITRITE: NEGATIVE MMOL/L (ref 21–30)
URINE ASCORBIC ACID, UA: NEGATIVE K/UL (ref 3–12)
URINE BILE: NEGATIVE g/dL — ABNORMAL HIGH (ref 3.5–5.0)

## 2021-08-05 LAB — POC BLOOD GAS VEN
BICARB, VEN POC: 25 MMOL/L
PCO2, VEN POC: 46 mmHg (ref 36–50)
PH, VEN POC: 7.3 (ref 7.30–7.40)

## 2021-08-05 LAB — POC HEMATOCRIT
HEMATOCRIT POC: 68 % — ABNORMAL HIGH (ref 36–45)
HEMOGLOBIN POC: 23 g/dL — ABNORMAL HIGH (ref 12.0–15.0)

## 2021-08-05 LAB — URINALYSIS MICROSCOPIC REFLEX TO CULTURE

## 2021-08-05 LAB — POC GLUCOSE
POC GLUCOSE: 284 mg/dL — ABNORMAL HIGH (ref 70–100)
POC GLUCOSE: 321 mg/dL — ABNORMAL HIGH (ref 70–100)
POC GLUCOSE: 334 mg/dL — ABNORMAL HIGH (ref 70–100)
POC GLUCOSE: 346 mg/dL — ABNORMAL HIGH (ref 70–100)
POC GLUCOSE: 348 mg/dL — ABNORMAL HIGH (ref 70–100)
POC GLUCOSE: 359 mg/dL — ABNORMAL HIGH (ref 70–100)
POC GLUCOSE: 423 mg/dL — ABNORMAL HIGH (ref 70–100)

## 2021-08-05 LAB — PHOSPHORUS: PHOSPHORUS: 2.4 mg/dL (ref 2.0–4.5)

## 2021-08-05 LAB — COMPREHENSIVE METABOLIC PANEL
EGFR: >60 mL/min — ABNORMAL LOW (ref >60)
SODIUM: 133 MMOL/L — ABNORMAL LOW (ref 137–147)

## 2021-08-05 LAB — POC POTASSIUM: POTASSIUM, POC: 4.7 MMOL/L — ABNORMAL LOW (ref 3.5–5.1)

## 2021-08-05 LAB — POC SODIUM: SODIUM, POC: 134 MMOL/L — ABNORMAL LOW (ref 137–147)

## 2021-08-05 LAB — CREATINE KINASE-CPK: CK TOTAL: 40 U/L (ref 21–215)

## 2021-08-05 LAB — C REACTIVE PROTEIN (CRP): C-REACTIVE PROTEIN: 0.1 mg/dL (ref <1.0)

## 2021-08-05 LAB — SED RATE: ESR: 12 mm/h (ref 0–20)

## 2021-08-05 LAB — TROPONIN-I: TROPONIN I: 0 ng/mL (ref 0.0–0.05)

## 2021-08-05 LAB — D-DIMER: D-DIMER: 179 ng{FEU}/mL — ABNORMAL HIGH (ref <500)

## 2021-08-05 LAB — BETA HYDROXYBUTYRATE (KETONES): BETA HYDROXYBUTYRATE: 0.4 MMOL/L — ABNORMAL HIGH (ref <0.3)

## 2021-08-05 MED ORDER — ONDANSETRON 4 MG PO TBDI
4 mg | ORAL | 0 refills | Status: AC | PRN
Start: 2021-08-05 — End: ?

## 2021-08-05 MED ORDER — IOHEXOL 350 MG IODINE/ML IV SOLN
70 mL | Freq: Once | INTRAVENOUS | 0 refills | Status: CP
Start: 2021-08-05 — End: ?
  Administered 2021-08-06: 01:00:00 70 mL via INTRAVENOUS

## 2021-08-05 MED ORDER — ASPIRIN 81 MG PO TBEC
81 mg | Freq: Every day | ORAL | 0 refills | Status: AC
Start: 2021-08-05 — End: ?
  Administered 2021-08-06: 14:00:00 81 mg via ORAL

## 2021-08-05 MED ORDER — IMS MIXTURE TEMPLATE
37.5 mg | Freq: Two times a day (BID) | ORAL | 0 refills | Status: AC
Start: 2021-08-05 — End: ?
  Administered 2021-08-05 – 2021-08-06 (×6): 37.5 mg via ORAL

## 2021-08-05 MED ORDER — SODIUM CHLORIDE 0.9 % IJ SOLN
50 mL | Freq: Once | INTRAVENOUS | 0 refills | Status: CP
Start: 2021-08-05 — End: ?
  Administered 2021-08-06: 01:00:00 50 mL via INTRAVENOUS

## 2021-08-05 MED ORDER — MELATONIN 5 MG PO TAB
5 mg | Freq: Every evening | ORAL | 0 refills | Status: AC | PRN
Start: 2021-08-05 — End: ?

## 2021-08-05 MED ORDER — MAGNESIUM SULFATE IN D5W 1 GRAM/100 ML IV PGBK
1 g | Freq: Once | INTRAVENOUS | 0 refills | Status: CP
Start: 2021-08-05 — End: ?
  Administered 2021-08-05: 18:00:00 1 g via INTRAVENOUS

## 2021-08-05 MED ORDER — TACROLIMUS 1 MG PO CAP
3 mg | Freq: Two times a day (BID) | ORAL | 0 refills | Status: DC
Start: 2021-08-05 — End: 2021-08-05
  Administered 2021-08-05: 17:00:00 3 mg via ORAL

## 2021-08-05 MED ORDER — ENOXAPARIN 40 MG/0.4 ML SC SYRG
40 mg | Freq: Every day | SUBCUTANEOUS | 0 refills | Status: AC
Start: 2021-08-05 — End: ?
  Administered 2021-08-06: 02:00:00 40 mg via SUBCUTANEOUS

## 2021-08-05 MED ORDER — PROCHLORPERAZINE EDISYLATE 5 MG/ML IJ SOLN
10 mg | Freq: Once | INTRAVENOUS | 0 refills | Status: CP
Start: 2021-08-05 — End: ?
  Administered 2021-08-05: 16:00:00 10 mg via INTRAVENOUS

## 2021-08-05 MED ORDER — ONDANSETRON HCL (PF) 4 MG/2 ML IJ SOLN
4 mg | Freq: Once | INTRAVENOUS | 0 refills | Status: CP
Start: 2021-08-05 — End: ?
  Administered 2021-08-05: 14:00:00 4 mg via INTRAVENOUS

## 2021-08-05 MED ORDER — ONDANSETRON HCL (PF) 4 MG/2 ML IJ SOLN
4 mg | Freq: Once | INTRAVENOUS | 0 refills | Status: CP
Start: 2021-08-05 — End: ?
  Administered 2021-08-05: 10:00:00 4 mg via INTRAVENOUS

## 2021-08-05 MED ORDER — DEXTROSE 5% IN WATER 25ML IV FLUSH BAG
Freq: Two times a day (BID) | INTRAVENOUS | 0 refills | Status: DC
Start: 2021-08-05 — End: 2021-08-05

## 2021-08-05 MED ORDER — MORPHINE 4 MG/ML IV SYRG
8 mg | Freq: Once | INTRAVENOUS | 0 refills | Status: CP
Start: 2021-08-05 — End: ?
  Administered 2021-08-05: 10:00:00 8 mg via INTRAVENOUS

## 2021-08-05 MED ORDER — POLYETHYLENE GLYCOL 3350 17 GRAM PO PWPK
1 | Freq: Two times a day (BID) | ORAL | 0 refills | Status: AC
Start: 2021-08-05 — End: ?

## 2021-08-05 MED ORDER — INSULIN GLARGINE 100 UNIT/ML (3 ML) SC INJ PEN
34 [IU] | Freq: Every day | SUBCUTANEOUS | 0 refills | Status: AC
Start: 2021-08-05 — End: ?
  Administered 2021-08-06: 02:00:00 34 [IU] via SUBCUTANEOUS

## 2021-08-05 MED ORDER — ACETAMINOPHEN 500 MG PO TAB
1000 mg | Freq: Once | ORAL | 0 refills | Status: CP
Start: 2021-08-05 — End: ?
  Administered 2021-08-05: 10:00:00 1000 mg via ORAL

## 2021-08-05 MED ORDER — ONDANSETRON HCL (PF) 4 MG/2 ML IJ SOLN
4 mg | INTRAVENOUS | 0 refills | Status: AC | PRN
Start: 2021-08-05 — End: ?

## 2021-08-05 MED ORDER — DEXTROSE 50 % IN WATER (D50W) IV SYRG
12.5-25 g | INTRAVENOUS | 0 refills | Status: AC | PRN
Start: 2021-08-05 — End: ?

## 2021-08-05 MED ORDER — MYCOPHENOLATE IVPB
750 mg | Freq: Two times a day (BID) | INTRAVENOUS | 0 refills | Status: AC
Start: 2021-08-05 — End: ?
  Administered 2021-08-06 (×2): 750 mg via INTRAVENOUS

## 2021-08-05 MED ORDER — CHOLECALCIFEROL (VITAMIN D3) 25 MCG (1,000 UNIT) PO TAB
2000 [IU] | Freq: Every day | ORAL | 0 refills | Status: AC
Start: 2021-08-05 — End: ?
  Administered 2021-08-05 – 2021-08-06 (×2): 2000 [IU] via ORAL

## 2021-08-05 MED ORDER — INSULIN ASPART 100 UNIT/ML SC FLEXPEN
0-12 [IU] | Freq: Before meals | SUBCUTANEOUS | 0 refills | Status: DC
Start: 2021-08-05 — End: 2021-08-05

## 2021-08-05 MED ORDER — SODIUM CHLORIDE 0.9 % IV SOLP
1000 mL | INTRAVENOUS | 0 refills | Status: CP
Start: 2021-08-05 — End: ?
  Administered 2021-08-05: 13:00:00 1000 mL via INTRAVENOUS

## 2021-08-05 MED ORDER — FENTANYL CITRATE (PF) 50 MCG/ML IJ SOLN
50 ug | Freq: Once | INTRAVENOUS | 0 refills | Status: CP
Start: 2021-08-05 — End: ?
  Administered 2021-08-05: 10:00:00 50 ug via INTRAVENOUS

## 2021-08-05 MED ORDER — FAMOTIDINE (PF) 20 MG/2 ML IV SOLN
20 mg | Freq: Once | INTRAVENOUS | 0 refills | Status: CP
Start: 2021-08-05 — End: ?
  Administered 2021-08-05: 10:00:00 20 mg via INTRAVENOUS

## 2021-08-05 MED ORDER — ONDANSETRON HCL (PF) 4 MG/2 ML IJ SOLN
4 mg | Freq: Once | INTRAVENOUS | 0 refills | Status: CP
Start: 2021-08-05 — End: ?
  Administered 2021-08-05: 11:00:00 4 mg via INTRAVENOUS

## 2021-08-05 MED ORDER — DOCUSATE SODIUM 100 MG PO CAP
100 mg | Freq: Two times a day (BID) | ORAL | 0 refills | Status: AC
Start: 2021-08-05 — End: ?
  Administered 2021-08-06: 02:00:00 100 mg via ORAL

## 2021-08-05 MED ORDER — CLONIDINE HCL 0.2 MG PO TAB
.2 mg | Freq: Two times a day (BID) | ORAL | 0 refills | Status: AC
Start: 2021-08-05 — End: ?
  Administered 2021-08-05 – 2021-08-06 (×3): 0.2 mg via ORAL

## 2021-08-05 MED ORDER — ACETAMINOPHEN 325 MG PO TAB
650 mg | ORAL | 0 refills | Status: AC
Start: 2021-08-05 — End: ?
  Administered 2021-08-06 (×2): 650 mg via ORAL

## 2021-08-05 MED ORDER — DIPHENHYDRAMINE IVPB
25 mg | Freq: Once | INTRAVENOUS | 0 refills | Status: CP
Start: 2021-08-05 — End: ?
  Administered 2021-08-05 (×2): 25 mg via INTRAVENOUS

## 2021-08-05 MED ORDER — TAMSULOSIN 0.4 MG PO CAP
.4 mg | Freq: Every day | ORAL | 0 refills | Status: AC
Start: 2021-08-05 — End: ?
  Administered 2021-08-06: 14:00:00 0.4 mg via ORAL

## 2021-08-05 MED ORDER — CEFEPIME 2G/100ML NS IVPB (MB+)
2 g | INTRAVENOUS | 0 refills | Status: AC
Start: 2021-08-05 — End: ?
  Administered 2021-08-05 – 2021-08-06 (×6): 2 g via INTRAVENOUS

## 2021-08-05 MED ORDER — PANTOPRAZOLE 40 MG PO TBEC
40 mg | Freq: Every day | ORAL | 0 refills | Status: AC
Start: 2021-08-05 — End: ?
  Administered 2021-08-05 – 2021-08-06 (×2): 40 mg via ORAL

## 2021-08-05 MED ORDER — INSULIN ASPART 100 UNIT/ML SC FLEXPEN
1-25 [IU] | Freq: Three times a day (TID) | SUBCUTANEOUS | 0 refills | Status: AC
Start: 2021-08-05 — End: ?

## 2021-08-05 MED ORDER — ACETAMINOPHEN/LIDOCAINE/ANTACID DS(#) 1:1:3  PO SUSP
30 mL | Freq: Once | ORAL | 0 refills | Status: CP
Start: 2021-08-05 — End: ?
  Administered 2021-08-05: 10:00:00 30 mL via ORAL

## 2021-08-05 MED ORDER — INSULIN PUMP - LISPRO
Freq: Three times a day (TID) | SUBCUTANEOUS | 0 refills | Status: DC | PRN
Start: 2021-08-05 — End: 2021-08-05

## 2021-08-05 MED ORDER — INSULIN REGULAR HUMAN(#) 1 UNIT/ML IJ SYRINGE
5 [IU] | Freq: Once | INTRAVENOUS | 0 refills | Status: CP
Start: 2021-08-05 — End: ?
  Administered 2021-08-05: 16:00:00 5 [IU] via INTRAVENOUS

## 2021-08-05 MED ORDER — DEXTROSE 5% IN WATER 25ML IV FLUSH BAG
Freq: Two times a day (BID) | INTRAVENOUS | 0 refills | Status: AC
Start: 2021-08-05 — End: ?

## 2021-08-05 MED ORDER — INSULIN ASPART 100 UNIT/ML SC FLEXPEN
0-12 [IU] | Freq: Every day | SUBCUTANEOUS | 0 refills | Status: AC
Start: 2021-08-05 — End: ?
  Administered 2021-08-05 (×2): 8 [IU] via SUBCUTANEOUS

## 2021-08-05 MED ORDER — CEFTRIAXONE INJ 1GM IVP
1 g | INTRAVENOUS | 0 refills | Status: DC
Start: 2021-08-05 — End: 2021-08-05

## 2021-08-05 MED ORDER — PREDNISONE 5 MG/ML PO CONC
5 mg | Freq: Every day | ORAL | 0 refills | Status: AC
Start: 2021-08-05 — End: ?
  Administered 2021-08-06: 14:00:00 5 mg via ORAL

## 2021-08-05 MED ORDER — PREDNISONE 1 MG PO TAB
5 mg | Freq: Every day | ORAL | 0 refills | Status: DC
Start: 2021-08-05 — End: 2021-08-05
  Administered 2021-08-05: 17:00:00 5 mg via ORAL

## 2021-08-05 MED ORDER — VALGANCICLOVIR 450 MG PO TAB
900 mg | Freq: Every day | ORAL | 0 refills | Status: AC
Start: 2021-08-05 — End: ?
  Administered 2021-08-06: 14:00:00 900 mg via ORAL

## 2021-08-05 MED ORDER — IMS MIXTURE TEMPLATE
1.5 mg | Freq: Two times a day (BID) | SUBLINGUAL | 0 refills | Status: AC
Start: 2021-08-05 — End: ?
  Administered 2021-08-06 (×4): 1.5 mg via SUBLINGUAL

## 2021-08-05 MED ORDER — INSULIN GLARGINE 100 UNIT/ML (3 ML) SC INJ PEN
34 [IU] | Freq: Every day | SUBCUTANEOUS | 0 refills | Status: DC
Start: 2021-08-05 — End: 2021-08-05

## 2021-08-05 MED ORDER — SENNOSIDES-DOCUSATE SODIUM 8.6-50 MG PO TAB
2 | Freq: Two times a day (BID) | ORAL | 0 refills | Status: AC
Start: 2021-08-05 — End: ?
  Administered 2021-08-06 (×2): 2 via ORAL

## 2021-08-05 MED ORDER — MORPHINE 4 MG/ML IV SYRG
4 mg | Freq: Once | INTRAVENOUS | 0 refills | Status: CP
Start: 2021-08-05 — End: ?
  Administered 2021-08-05: 13:00:00 4 mg via INTRAVENOUS

## 2021-08-05 MED ORDER — MYCOPHENOLATE IVPB
500 mg | Freq: Two times a day (BID) | INTRAVENOUS | 0 refills | Status: DC
Start: 2021-08-05 — End: 2021-08-05

## 2021-08-05 MED ORDER — CEPHALEXIN 500 MG PO CAP
500 mg | Freq: Two times a day (BID) | ORAL | 0 refills | Status: CN
Start: 2021-08-05 — End: ?

## 2021-08-05 MED ORDER — BUPROPION XL 150 MG PO TB24
150 mg | Freq: Every morning | ORAL | 0 refills | Status: AC
Start: 2021-08-05 — End: ?
  Administered 2021-08-05 – 2021-08-06 (×2): 150 mg via ORAL

## 2021-08-05 MED ORDER — IMS MIXTURE TEMPLATE
540 mg | Freq: Two times a day (BID) | ORAL | 0 refills | Status: DC
Start: 2021-08-05 — End: 2021-08-05
  Administered 2021-08-05: 17:00:00 540 mg via ORAL

## 2021-08-05 NOTE — Telephone Encounter
On-call NC rec'd pg from pt re: high blood sugar concerns.   TC back to pt. She stated that her blood sugar has been high. I asked what her CGM has been reading and she stated that "it's so high it won't register". I advised pt that she needed to go to a local ED as soon as possible. Pt stated that she was concerned about going to a local ED, and that she will come to [place] which is 1+ hour from her home. I reiterated the importance of her being assessed as soon as possible for concerns with DKA. She stated that her s/o was now at home and that they would leave when completed our conversation. I reiterated the importance of being assessed as soon as possible and to advise s/o to call 911 if she developed any new symptoms, loss of consciousness, n/v, sweating, etc. Pt v/u of conversation.

## 2021-08-05 NOTE — ED Notes
POC glucose 423 - Dr Tarri Abernethy notified. Orders placed for IV insulin.

## 2021-08-05 NOTE — Consults
Endocrinology Consult H&P    [name]  Date of Admission: 08/05/2021    Impression:  1. Type 1 Diabetes Mellitus with complications, diagnosed since age 41  Steroid-induced hyperglycemia  Insulin dose change  Most recent HgbA1C 8.3 on 07/28/21, it was 7.3 on 05/07/21  Her initial BS was 367 with normal bicarb and anion gap with mildly abnormal beta hydroxybutyrate of 0.4.  When I reviewed her pump, auto mode was working until around 10 PM last night.  Auto mode was working consistently in the past several days.  Unclear whether pump was malfunctioning and but she changed her insulin pump  yesterday 08/04/2021 at 2:30 PM and the pump appears to be working for several hours after that.  PTA regimen:Medtronic 770 G insulin pump with CGM-  Hyperglycemia:?Frequent despite using automode  Hypoglycemia: infrequent  Meals per day: 2-3  Managed by:?Outside endocrinologist at [facility]  Complications of DM:  CAD: Yes  CVA: Yes  PVD: No  Amputations: No  Retinopathy: Yes  Gastropathy: No  Nephropathy: Yes  Neuropathy:?Yes  ?  Pump Manufacturer:?Medtronic????Model??770G  Insulin type??Humalog?  Insulin Time (IOB):3?hours  BG targets:?101-140  ?  ?  Insulin Pump Settings - (Changes this admission made in bold)  Time Basal Rate ISF I:C Ratio Time Basal Rate ISF I:C Ratio   MN 0.75 ??35 ?1:5 noon ?? ? ?   0100 ? ?? ? 1300 ?? ? ?   0200 ? ?? ? 1400 ??2.00 ? ?   0300 ? ?? ? 1500 ?? ? ?   0400 ? ?? ? 1600 ?? ? ?   0500 ? ?? ? 1700 ?? ? ?   0600 ? ?? ? 1800 ?? ? ?   0700 1.50 ?? ? 1900 ?? ? ?   0800 ? ?? ? 2000 ?? ? ?   0900 ? ?? ? 2100 ?? ? ?   1000 ? ?? ? 2200 ??0.8 ? ?   1100 ? ?? ? 2300 ?? ? ?   ?  Conversion to Injections:  Basal Insulin:??33.35?units daily (q24 hours)  ?  ESRD s/p DDRT?- 05/08/21  ?  Current dose of steroid - prednisone 5 mg daily   TSH 0.98 on 07/30/21    Recommendations/Plan:  1. Hold insulin pump and continue insulin MDI for now  2. lantus 34 units daily at noon  3. aspart 1 units for 5 g CHO TID  4. aspart MDCF five times daily  Will follow along with you with goal BS 100-140 mg/dl fasting and 161-096 during the day.  Will assist with transition back to insulin pump once clinically improved.    ____________________________________________________________  Reason for consult:DM 1 management on insulin pump    HPI: Brandy Joseph is a 41 y.o. 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  Last night, she woke up at approximately 11:45 PM with nausea and dry heaves.  She checked her glucose and it was over 400. BS did not come down insulin pump bolused her with insulin, so she gave her 5 units of SQ insulin and presented to ED for further evaluation. Her initial BS was 367 with  normal bicarb and anion gap with mildly abnormal beta hydroxybutyrate of 0.4.When I reviewed her pump, auto mode was working until around 10 PM last night.  Auto mode was working consistently in the past several days.  Unclear whether pump was malfunctioning and but she changed her insulin pump  yesterday 08/04/2021 at 2:30 PM and the pump appears to be working for several hours after that.    PMH:  Medical History:   Diagnosis Date   ? 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 DM (diabetes mellitus) (HCC)    ? Gastroparesis    ? Hypertension    ? Kidney disease    ? Memory loss    ? Stroke (HCC)            ROS: A complete 14 point ROS was obtained and was positive only for headaches, generalized malaise, chest tightness, abdominal pain, nausea . Rest of the ROS negative.    SOCIAL HISTORY:  Social History     Socioeconomic History   ? Marital status: Divorced   ? Number of children: 1   ? Highest education level: Associate degree: occupational, Scientist, product/process development, or vocational program   Tobacco Use   ? Smoking status: Never   ? Smokeless tobacco: Never   Vaping Use   ? Vaping Use: Never used   Substance and Sexual Activity   ? Alcohol use: Not Currently     Alcohol/week: 0.0 standard drinks     Comment: rare, socially   ? Drug use: Never   ? Sexual activity: Yes     Partners: Male     Birth control/protection: None, Surgical        FAMILY HISTORY:  Reviewed, non-contributory. No FH of diabetes.  Father died of unknown causes at the age of 69.  Mother died of complication of a CVA at the age of 27.         ALLERGIES:  Allergies   Allergen Reactions   ? Ketorolac RASH   ? Sulfa (Sulfonamide Antibiotics) EDEMA     Full body swelling; eye swelling; itchiness        Vital Signs: Last Filed In 24 Hours Vital Signs: 24 Hour Range   BP: 183/84 (01/31 0800)  Temp: 36.7 ?C (98 ?F) (01/31 0152)  Pulse: 79 (01/31 0800)  Respirations: 20 PER MINUTE (01/31 0800)  SpO2: 100 % (01/31 0800)  O2 Device: Nasal cannula (01/31 0734)  O2 Liter Flow: 2 Lpm (01/31 0734)  SpO2 Pulse: 79 (01/31 0800) BP: (149-183)/(62-84)   Temp:  [36.7 ?C (98 ?F)]   Pulse:  [68-87]   Respirations:  [13 PER MINUTE-31 PER MINUTE]   SpO2:  [95 %-100 %]   O2 Device: Nasal cannula  O2 Liter Flow: 2 Lpm     PHYSICAL EXAMINATION:  GEN: sleep but oriented, in moderate distress  HEENT: mildly pale, no jaundice, wear oxygen mask  NECK: Neck supple  RESP: breathing comfortably  CVS: Regular rate and rhythm  EXT: No edema/cyanosis  SKIN: No rash      LABS:  Recent Labs     08/05/21  0312   NA 133*   K 4.7   CL 100   CO2 24   GAP 9   BUN 23   CR 1.13*   GLU 367*   CA 9.1   ALBUMIN 3.9   MG 1.8   PO4 2.4       Recent Labs     08/05/21  0312   WBC 7.2  HGB 10.7*   HCT 32.7*   PLTCT 194   AST 13   ALT 13   ALKPHOS 52   TNI 0.00     Lipid Profile:   Lab Results   Component Value Date    CHOL 208 07/30/2021    TRIG 148 07/30/2021    HDL 78 07/30/2021    LDL 90 07/30/2021    VLDL 30 07/30/2021      Estimated Creatinine Clearance: 73.4 mL/min (A) (based on SCr of 1.13 mg/dL (H)).  Vitals:    08/05/21 0152   Weight: 88.5 kg (195 lb)    No results for input(s): PHART, PO2ART in the last 72 hours.    Invalid input(s): PC02A      Thyroid Studies    Lab Results   Component Value Date/Time    TSH 0.98 07/30/2021 09:58 AM    No results found for: Edsel Petrin, THYBINDGLB     MEDS:          MEDS[START ON 08/06/2021] aspirin EC, 81 mg, Oral, QDAY  buPROPion XL, 150 mg, Oral, QAM8  carvediloL, 37.5 mg, Oral, BID  cefepime  ( MAXIPIME ) IV, 2 g, Intravenous, Q8H*  CHOLEcalciferoL (vitamin D3), 2,000 Units, Oral, QDAY  cloNIDine HCL, 0.2 mg, Oral, BID  darbepoetin alfa, 100 mcg, Subcutaneous, Q7 Days  magnesium sulfate, 1 g, Intravenous, ONCE   And  diphenhydrAMINE (BENADRYL) IVPB, 25 mg, Intravenous, ONCE  docusate, 100 mg, Oral, BID  enoxaparin, 40 mg, Subcutaneous, QDAY(21)  insulin aspart (NovoLOG) injection, 0-12 Units, Subcutaneous, ACHS (22)  mycophenolate sodium, 540 mg, Oral, BID  pantoprazole DR, 40 mg, Oral, QDAY  polyethylene glycol 3350, 1 packet, Oral, BID  predniSONE, 5 mg, Oral, QDAY  senna/docusate, 2 tablet, Oral, BID  tacrolimus, 3 mg, Oral, BID  tamsulosin, 0.4 mg, Oral, QDAY after breakfast  valGANciclovir, 900 mg, Oral, QDAY w/breakfast     IV MEDS  Prn dextrose 50% (D50) IV PRN, melatonin QHS PRN, ondansetron Q6H PRN **OR** ondansetron (ZOFRAN) IV Q6H PRN     HOME MEDS(Not in a hospital admission)         Konrad Felix, MD  Pager # [phone]  08/05/2021

## 2021-08-05 NOTE — ED Provider Notes
Brandy Joseph is a 41 y.o. female.    Chief Complaint:  Chief Complaint   Patient presents with   ? High Blood Sugar     Kidney transplant  high blood sugar       History of Present Illness:  Patient is a 41 year old female with history of DM1, HTN, ESRD s/p renal transplant presenting with elevated blood glucose, myalgias, and headache onset earlier today. She also notes elevated blood glucoses over the last 2 days without any change to her diet, medications, or missed insulin doses. Her spouse at bedside does report having common cold symptoms starting 2 days ago. She has had good urine output and no dysuria, fevers, chills, or any other symptoms.      Review of Systems:  Review of Systems   Musculoskeletal: Positive for myalgias.   Neurological: Positive for headaches.       Allergies:  Ketorolac and Sulfa (sulfonamide antibiotics)    Past Medical History:  Medical History:   Diagnosis Date   ? 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 DM (diabetes mellitus) (HCC)    ? Gastroparesis    ? Hypertension    ? Kidney disease    ? Memory loss    ? Stroke [facility])        Past Surgical History:  Surgical History:   Procedure Laterality Date   ? ANKLE SURGERY Left 1996   ? FIBULA FRACTURE SURGERY Left 1996   ? ESOPHAGOGASTRODUODENOSCOPY WITH BIOPSY - FLEXIBLE N/A 03/29/2018    Performed by Virgina Organ, MD at [facility] ENDO   ? ESOPHAGOGASTRODUODENOSCOPY WITH BIOPSY - FLEXIBLE N/A 10/04/2019    Performed by Everardo All, MD at [facility] ENDO   ? COLONOSCOPY WITH BIOPSY - FLEXIBLE N/A 10/04/2019    Performed by Everardo All, MD at [facility] ENDO   ? ANGIOGRAPHY CORONARY ARTERY WITH LEFT HEART CATHETERIZATION N/A 01/18/2020    Performed by Reola Mosher, MD at [facility] CATH LAB   ? POSSIBLE PERCUTANEOUS CORONARY STENT PLACEMENT WITH ANGIOPLASTY N/A 01/18/2020    Performed by Reola Mosher, MD at [facility] CATH LAB   ? COLONOSCOPY WITH BIOPSY - FLEXIBLE N/A 05/15/2020    Performed by Eliott Nine, MD at [facility] ENDO   ? COLONOSCOPY WITH SNARE REMOVAL TUMOR/ POLYP/ OTHER LESION  05/15/2020    Performed by Eliott Nine, MD at [facility] ENDO   ? ALLOTRANSPLANTATION KIDNEY FROM NON LIVING DONOR WITHOUT RECIPIENT NEPHRECTOMY N/A 05/07/2021    Performed by Nicholos Johns, MD at [facility] OR   ? BURR HOLE      release pressure from stroke   ? DIALYSIS FISTULA CREATION     ? GALLBLADDER SURGERY     ? HX CESAREAN SECTION         Pertinent medical/surgical history reviewed    Social History:  Social History     Tobacco Use   ? Smoking status: Never   ? Smokeless tobacco: Never   Vaping Use   ? Vaping Use: Never used   Substance Use Topics   ? Alcohol use: Not Currently     Alcohol/week: 0.0 standard drinks     Comment: rare, socially   ? Drug use: Never     Social History     Substance and Sexual Activity   Drug Use Never             Family History:  Family History   Problem Relation Age of Onset   ? Stroke Mother    ?  Diabetes Paternal Grandmother    ? Heart problem Paternal Grandmother    ? Hypertension Paternal Grandmother    ? Tumor Maternal Grandmother         brain   ? None Reported Son        Vitals:  ED Vitals    Date and Time T BP P RR SPO2P SPO2 User   08/05/21 0500 -- -- 87 17 PER MINUTE 87 95 % JD   08/05/21 0230 -- 149/62 68 13 PER MINUTE 68 98 % MC   08/05/21 0152 36.7 ?C (98 ?F) 164/78 -- 20 PER MINUTE 73 100 % KJ          Physical Exam:  Physical Exam  Vitals and nursing note reviewed.   Constitutional:       General: She is not in acute distress.     Appearance: She is obese.   HENT:      Head: Normocephalic and atraumatic.      Nose: Nose normal.      Mouth/Throat:      Mouth: Mucous membranes are moist.      Pharynx: Oropharynx is clear.   Eyes:      Extraocular Movements: Extraocular movements intact.      Conjunctiva/sclera: Conjunctivae normal.      Pupils: Pupils are equal, round, and reactive to light.   Cardiovascular:      Rate and Rhythm: Normal rate and regular rhythm.      Pulses: Normal pulses.      Heart sounds: No murmur heard.  Pulmonary:      Effort: Pulmonary effort is normal. No respiratory distress.      Breath sounds: Normal breath sounds. No stridor. No wheezing.   Abdominal:      General: Abdomen is flat. There is no distension.      Palpations: Abdomen is soft.      Tenderness: There is abdominal tenderness (Mildly tender RLQ overlying pelvic kidney). There is no guarding or rebound.   Musculoskeletal:         General: No swelling or tenderness. Normal range of motion.      Cervical back: Normal range of motion and neck supple. No tenderness.   Skin:     General: Skin is warm and dry.   Neurological:      General: No focal deficit present.      Mental Status: She is alert and oriented to person, place, and time. Mental status is at baseline.      Cranial Nerves: No cranial nerve deficit.      Sensory: No sensory deficit.      Motor: No weakness.   Psychiatric:         Mood and Affect: Mood normal.         Behavior: Behavior normal.         Laboratory Results:  Labs Reviewed   POC GLUCOSE - Abnormal       Result Value Ref Range Status    Glucose, POC 348 (*) 70 - 100 MG/DL Final   CBC AND DIFF - Abnormal    White Blood Cells 7.2  4.5 - 11.0 K/UL Final    RBC 3.46 (*) 4.0 - 5.0 M/UL Final    Hemoglobin 10.7 (*) 12.0 - 15.0 GM/DL Final    Hematocrit 16.1 (*) 36 - 45 % Final    MCV 94.6  80 - 100 FL Final    MCH 30.9  26 - 34 PG Final  MCHC 32.7  32.0 - 36.0 G/DL Final    RDW 86.5  11 - 15 % Final    Platelet Count 194  150 - 400 K/UL Final    MPV 7.7  7 - 11 FL Final    Neutrophils 80 (*) 41 - 77 % Final    Lymphocytes 14 (*) 24 - 44 % Final    Monocytes 4  4 - 12 % Final    Eosinophils 1  0 - 5 % Final    Basophils 1  0 - 2 % Final    Absolute Neutrophil Count 5.84  1.8 - 7.0 K/UL Final    Absolute Lymph Count 0.99 (*) 1.0 - 4.8 K/UL Final    Absolute Monocyte Count 0.27  0 - 0.80 K/UL Final    Absolute Eosinophil Count 0.04  0 - 0.45 K/UL Final    Absolute Basophil Count 0.05  0 - 0.20 K/UL Final    MDW (Monocyte Distribution Width) 19.3  <20.7 Final COMPREHENSIVE METABOLIC PANEL - Abnormal    Sodium 133 (*) 137 - 147 MMOL/L Final    Potassium 4.7  3.5 - 5.1 MMOL/L Final    Chloride 100  98 - 110 MMOL/L Final    Glucose 367 (*) 70 - 100 MG/DL Final    Blood Urea Nitrogen 23  7 - 25 MG/DL Final    Creatinine 7.84 (*) 0.4 - 1.00 MG/DL Final    Calcium 9.1  8.5 - 10.6 MG/DL Final    Total Protein 6.5  6.0 - 8.0 G/DL Final    Total Bilirubin 0.5  0.3 - 1.2 MG/DL Final    Albumin 3.9  3.5 - 5.0 G/DL Final    Alk Phosphatase 52  25 - 110 U/L Final    AST (SGOT) 13  7 - 40 U/L Final    CO2 24  21 - 30 MMOL/L Final    ALT (SGPT) 13  7 - 56 U/L Final    Anion Gap 9  3 - 12 Final    eGFR >60  >60 mL/min Final   URINALYSIS DIPSTICK REFLEX TO CULTURE - Abnormal    Color,UA STRAW   Final    Turbidity,UA CLEAR  CLEAR-CLEAR Final    Specific Gravity-Urine 1.020  1.005 - 1.030 Final    pH,UA 7.0  5.0 - 8.0 Final    Protein,UA NEG  NEG-NEG Final    Glucose,UA 3+ (*) NEG-NEG Final    Ketones,UA TRACE (*) NEG-NEG Final    Bilirubin,UA NEG  NEG-NEG Final    Blood,UA 1+ (*) NEG-NEG Final    Urobilinogen,UA NORMAL  NORM-NORMAL Final    Nitrite,UA NEG  NEG-NEG Final    Leukocytes,UA 2+ (*) NEG-NEG Final    Urine Ascorbic Acid, UA NEG  NEG-NEG Final   BETA HYDROXYBUTYRATE (KETONES) - Abnormal    Beta Hydroxybutyrate 0.4 (*) <0.3 MMOL/L Final   POC BLOOD GAS VEN - Abnormal    PH-VEN-POC 7.35  7.30 - 7.40 Final    PCO2-VEN-POC 46  36 - 50 MMHG Final    PO2-VEN-POC 29 (*) 33 - 48 MMHG Final    Base Ex-VEN-POC 0.0  MMOL/L Final    O2 Sat-VEN-POC 51.0 (*) 55 - 71 % Final    Bicarbonate-VEN-POC 25.7  MMOL/L Final   POC HEMATOCRIT - Abnormal    Hemoglobin POC 23.1 (*) 12.0 - 15.0 GM/DL Final    Hematocrit POC 68.0 (*) 36 - 45 % Final   POC SODIUM -  Abnormal    Sodium-POC 134 (*) 137 - 147 MMOL/L Final   CULTURE-URINE W/SENSITIVITY   MAGNESIUM    Magnesium 1.8  1.6 - 2.6 mg/dL Final   URINALYSIS MICROSCOPIC REFLEX TO CULTURE    WBCs,UA 20-50  0 - 2 /HPF Final    RBCs,UA 0-2  0 - 3 /HPF Final Comment,UA     Final    Value: Criteria for reflex to culture are WBC>10, Positive Nitrite, and/or >=+1   leukocytes. If quantity is not sufficient, an addendum will follow.      MucousUA TRACE   Final    Squamous Epithelial Cells 0-2  0 - 5 Final   RVP VIRAL PANEL PCR    Specimen Source Resp Panel     Final    Value: FLOCKED SWAB  NASOPHARYNGEAL  Use of this test on specimens other than nasopharyngeal swabs has not been   cleared by the [place] Food and Drug Administration. The performance characteristics   were determined by the [facility].      Adenovirus NOT DETECTED  DN-NOT DETECTED Final    Coronavirus 229E NOT DETECTED  DN-NOT DETECTED Final    Coronavirus HKU1 NOT DETECTED  DN-NOT DETECTED Final    Coronavirus NL63 NOT DETECTED  DN-NOT DETECTED Final    Coronavirus OC43 NOT DETECTED  DN-NOT DETECTED Final    SARS-CoV-2 NOT DETECTED  DN-NOT DETECTED Final    Human Metapneumovirus NOT DETECTED  DN-NOT DETECTED Final    Human Rhinovirus/ENTEROVIRUS NOT DETECTED  DN-NOT DETECTED Final    Influenza A H1N1 2009 NOT DETECTED  DN-NOT DETECTED Final    Influenza A H1 NOT DETECTED  DN-NOT DETECTED Final    Influenza A H3 NOT DETECTED  DN-NOT DETECTED Final    Influenza B NOT DETECTED  DN-NOT DETECTED Final    Parainfluenza virus 1 NOT DETECTED  DN-NOT DETECTED Final    Parainfluenza virus 2 NOT DETECTED  DN-NOT DETECTED Final    Parainfluenza virus 3 NOT DETECTED  DN-NOT DETECTED Final    Parainfluenza virus 4 NOT DETECTED  DN-NOT DETECTED Final    RSV NOT DETECTED  DN-NOT DETECTED Final    Bordetella parapertussis NOT DETECTED  DN-NOT DETECTED Final    Bordetella pertussis NOT DETECTED  DN-NOT DETECTED Final    Chlamydia pneumoniae NOT DETECTED  DN-NOT DETECTED Final    Mycoplasma pneumoniae NOT DETECTED  DN-NOT DETECTED Final   POC POTASSIUM    Potassium-POC 4.7  3.5 - 5.1 MMOL/L Final   UA GREY TOP TUBE   CLEAR TOP EXTRA URINE TUBE   BLOOD GASES, PERIPHERAL VENOUS   POC GLUCOSE POC Glucose (Download): (!) 348    Radiology Interpretation:    CHEST SINGLE VIEW   Final Result         No acute cardiopulmonary abnormality.          Finalized by Earlyne Iba, MD, PhD on 08/05/2021 5:13 AM. Dictated by Earlyne Iba, MD, PhD on 08/05/2021 5:12 AM.         US KIDNEY TRANSPLANT    (Results Pending)   US DOPPLER VENOUS BILATERAL    (Results Pending)   [place] HEAD AND NECK    (Results Pending)         EKG:      Medical Decision Making:  Krisztina Edleman is a 41 y.o. female who presents with chief complaint as listed above. Based on the history and presentation, the list of differential diagnoses considered  included, but was not limited to, DKA/HHS, influenza, viral infection, PNA, UTI, medication side effect, SVC syndrome    ED Course  - History obtained from patient and spouse at bedside. Records from recent hospitalizations reviewed. Vitals noted, BP 160s/70s. Exam as above.   - Labs notable for mildly elevated beta hydroxybutyrate, mildly elevated creatinine from baseline, UA with 3+ glucose, 20-50 WBCs. Independent review of CXR unremarkable. US renal transplant, upper extremities, and IJ pending  - Patient given Tylenol, fentanyl, Zofran, GI cocktail, Pepcid   - On re-evaluation, patient complaining of worsening pain and vomiting. Redosed on morphine, Zofran  - Patient handed off to Dr. Caron Presume pending completion of imaging and plan for discussion with transplant nephrology and likely admission         Complexity of Problems Addressed  Patient's active diagnoses as well as contributing pre-existing medical problems include:  Clinical Impression   Type 1 diabetes mellitus with hyperglycemia (HCC)   Kidney transplant recipient   Myalgia   Acute nonintractable headache, unspecified headache type     Evaluation performed for potential threat to life or bodily function during this visit given the initial differential diagnosis and clinical impression(s) as discussed previously in MDM/ED course.    Additional data reviewed:    ? History was obtained from an independent historian: Spouse  ? Prior non-ED notes reviewed: Recent discharge summary and/or H&P  ? Independent interpretation of diagnostic tests was performed by me: Not in addition to what is mentioned above  ? Patient presentation/management was discussed with the following qualified health care professionals and/or other relevant professionals: Admitting Physician    Risk evaluation:    ? Diagnosis or treatment of patient condition impacted by social determinant of health: None  ? Tests Considered but not performed due to clinical scoring (if not mentioned in ED course, aside from what is implied by clinical scores listed):   ? Rationale regarding whether admission or escalation of care considered if not performed (if not mentioned in ED course, aside from what is implied by clinical scores listed):     ED Scoring:                                Facility Administered Meds:  Medications   acetaminophen (TYLENOL EXTRA STRENGTH) tablet 1,000 mg (1,000 mg Oral Given 08/05/21 0345)   fentaNYL citrate PF (SUBLIMAZE) injection 50 mcg (50 mcg Intravenous Given 08/05/21 0344)   ondansetron (ZOFRAN) injection 4 mg (4 mg Intravenous Med Not Given 08/05/21 0431)   acetaminophen/lidocaine/antacid DS(#) (GI COCKTAIL) 1:1:3 suspension 30 mL (30 mL Oral Given 08/05/21 0427)   famotidine (PEPCID) injection 20 mg (20 mg Intravenous Given 08/05/21 0427)   morphine injection 8 mg (8 mg Intravenous Given 08/05/21 0426)   ondansetron (ZOFRAN) injection 4 mg (4 mg Intravenous Given 08/05/21 0431)       Clinical Impression:  Clinical Impression   Type 1 diabetes mellitus with hyperglycemia (HCC)   Kidney transplant recipient   Myalgia   Acute nonintractable headache, unspecified headache type       Disposition/Follow up  ED Disposition     None        No follow-up provider specified.    Medications:  New Prescriptions    No medications on file       Procedure Notes:  Procedures    Attestation / Supervision:        Donnita Falls, MD

## 2021-08-06 ENCOUNTER — Observation Stay: Admit: 2021-08-06 | Discharge: 2021-08-06 | Payer: MEDICARE

## 2021-08-06 ENCOUNTER — Encounter: Admit: 2021-08-06 | Discharge: 2021-08-06 | Payer: MEDICARE

## 2021-08-06 MED ADMIN — SODIUM CHLORIDE 0.9 % IV SOLP [27838]: 250 mL | INTRAVENOUS | @ 02:00:00 | Stop: 2021-08-06 | NDC 00338004902

## 2021-08-06 MED ADMIN — MYCOPHENOLATE SODIUM 360 MG PO TBEC [89945]: 540 mg | ORAL | @ 16:00:00 | Stop: 2021-08-06 | NDC 67877042712

## 2021-08-06 MED ADMIN — MYCOPHENOLATE SODIUM 180 MG PO TBEC [89944]: 540 mg | ORAL | @ 16:00:00 | Stop: 2021-08-06 | NDC 67877042612

## 2021-08-06 MED FILL — LISINOPRIL 5 MG PO TAB: 5 mg | ORAL | 30 days supply | Qty: 30 | Fill #1 | Status: CP

## 2021-08-06 NOTE — Progress Notes
Assessed right arm with sono- arm is very tight, slightly swollen and tender.  Attempted PIV x1- it appeared to be in correct position, with good blood return.  As I barely touched the flush, pt stated it's very painful.  PIV was not flushed.  PIV removed.   Pt is left limb alert d/t fistula.  Pt states that her PIV went bad this morning.  Will have another staff from [facility] assess pt.

## 2021-08-07 ENCOUNTER — Encounter: Admit: 2021-08-07 | Discharge: 2021-08-07 | Payer: MEDICARE

## 2021-08-07 MED ORDER — POLYETHYLENE GLYCOL 3350 17 GRAM/DOSE PO POWD
34 g | Freq: Two times a day (BID) | ORAL | 1 refills | Status: CN | PRN
Start: 2021-08-07 — End: ?

## 2021-08-07 MED ORDER — PREDNISONE 5 MG PO TAB
5 mg | ORAL_TABLET | Freq: Every day | ORAL | 3 refills | Status: AC
Start: 2021-08-07 — End: ?
  Filled 2021-08-20: qty 90, 90d supply, fill #1

## 2021-08-07 NOTE — Telephone Encounter
Reviewed labs from 08/06/2021 at this time    Hgb 11.3  WBC 8.8  Creatinine 1.16  Mg 2.6  Tacrolimus 14.2  (Goal 8-12)    Received the following orders from Dr. Lyndel Safe :  Reduce Prograf to 1/2 (from 3/3)    Reviewed labs with Brandy Joseph at this time via phone   Gave dose change to patient at this time and patient repeated it back to me correctly  Medication list updated       Brandy Joseph support person sent incoming mychart message with concerns post discharge. TC to Brandy Joseph at this time. Brandy Joseph voiced multiple concerns during inpatient stay as follows:    -No med list provided on discharge to patient with cognitive deficit 92 days post transplant  -Discharge planning given to patient without support person available--patient has cognitive deficit  -No follow up on concerns for blood clot in lung  -continuous blood glucose monitor thrown away during inpatient stay  -Support person Brandy Joseph reports that she witnessed bedside nurse asking patient what dose of insulin she should give patient x 2 instead of following orders on chart  -discharge of patient with continued high blood sugars (multiple instances of high blood sugars during transplant course)     Provided support to Aunt Brandy Joseph at this time. Provided patient advocate line and asked Brandy Joseph to follow up with them immediately. SI completed. TC to Dr. Monia Pouch endocrine and spoke with Jocelyn Lamer RN to update that patient does not have CGM currently and was admitted for hyperglycemia. Also provided memory testing date. New Med action plan generated and emailed to Brandy Joseph support person at [email or web address]

## 2021-08-08 ENCOUNTER — Encounter: Admit: 2021-08-08 | Discharge: 2021-08-08 | Payer: MEDICARE

## 2021-08-11 ENCOUNTER — Encounter: Admit: 2021-08-11 | Discharge: 2021-08-11 | Payer: MEDICARE

## 2021-08-13 ENCOUNTER — Encounter: Admit: 2021-08-13 | Discharge: 2021-08-13 | Payer: MEDICARE

## 2021-08-14 ENCOUNTER — Encounter: Admit: 2021-08-14 | Discharge: 2021-08-14 | Payer: MEDICARE

## 2021-08-14 DIAGNOSIS — D849 Immunodeficiency, unspecified: Secondary | ICD-10-CM

## 2021-08-14 DIAGNOSIS — Z940 Kidney transplant status: Secondary | ICD-10-CM

## 2021-08-14 DIAGNOSIS — D696 Thrombocytopenia, unspecified: Secondary | ICD-10-CM

## 2021-08-14 DIAGNOSIS — Z79899 Other long term (current) drug therapy: Secondary | ICD-10-CM

## 2021-08-14 LAB — MAGNESIUM: MAGNESIUM: 1.6 mg/dL (ref 1.6–2.6)

## 2021-08-14 LAB — CBC AND DIFF
HEMATOCRIT: 37 % (ref 34.1–44.9)
HEMOGLOBIN: 11 g/dL (ref 11.2–15.7)
MCH: 30 pg — ABNORMAL HIGH (ref 25.6–32.2)
MCV: 100 U/L — ABNORMAL HIGH (ref 79.4–94.8)
PLATELET COUNT: 176 uL — ABNORMAL LOW (ref 182–369)
RBC COUNT: 3.6 uL — ABNORMAL LOW (ref 3.93–5.22)
WBC COUNT: 6.1 uL (ref >59)

## 2021-08-14 LAB — URINALYSIS DIPSTICK REFLEX TO CULTURE
GLUCOSE,UA: NEGATIVE
NITRITE: NEGATIVE
PROTEIN,UA: NEGATIVE
URINE BILE: NEGATIVE
URINE BLOOD: NEGATIVE
URINE KETONE: NEGATIVE
URINE PH: 6.5 (ref 5.0–7.0)
URINE SPEC GRAVITY: 1 (ref 1.002–1.030)
UROBILINOGEN: 0.2

## 2021-08-14 LAB — URINALYSIS MICROSCOPIC REFLEX TO CULTURE: BACTERIA,UA: <10 /HPF — AB

## 2021-08-14 LAB — COMPREHENSIVE METABOLIC PANEL
ANION GAP: 7 — ABNORMAL LOW (ref 8–16)
BLD UREA NITROGEN: 20 mg/dL — ABNORMAL HIGH (ref 7.0–18.7)
CHLORIDE: 105 mmol/L
CO2: 25 mmol/L (ref 22–29)
CREATININE: 1
POTASSIUM: 4.7 mmol/L (ref 3.5–5.1)
SODIUM: 137 mmol/L (ref 130–145)

## 2021-08-14 LAB — URIC ACID: URIC ACID: 4.4 (ref 2.6–6.0)

## 2021-08-14 LAB — PROTEIN/CR RATIO,UR RAN
PROT CREAT RAT/CAL: 0.1 g/dL (ref <0.2)
UR TOTAL PROTEIN,RAN: 16 mg/dL — ABNORMAL HIGH (ref 1–14)

## 2021-08-14 LAB — PHOSPHORUS: PHOSPHORUS: 4 mg/dL

## 2021-08-14 NOTE — Telephone Encounter
Pt Tess (aunt)  Is on ph in regards to pt BP fluxuating all day xfrc to [facility]

## 2021-08-14 NOTE — Telephone Encounter
Reviewed labs from 08/13/2021 at this time    Hgb 11.2  Platelets 176  WBC 6.12  Creatinine 1.05  Mg 1.6  Phos 4.0  Tacrolimus  Level inaccurate patient took meds before labs  UPC 0.11    Incoming call from Tess support person who reported that patient BP was 75/51 this am and patient has reported fatigue since starting lisinopril     Received the following orders from Dr. 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

## 2021-08-15 ENCOUNTER — Encounter: Admit: 2021-08-15 | Discharge: 2021-08-15 | Payer: MEDICARE

## 2021-08-15 DIAGNOSIS — Z79899 Other long term (current) drug therapy: Secondary | ICD-10-CM

## 2021-08-15 DIAGNOSIS — D849 Immunodeficiency, unspecified: Secondary | ICD-10-CM

## 2021-08-15 DIAGNOSIS — Z940 Kidney transplant status: Secondary | ICD-10-CM

## 2021-08-15 LAB — TACROLIMUS LC-MS/MS: TACROLIMUS LC-MS/MS: 8.8 ug/L (ref 5.0–20.0)

## 2021-08-18 ENCOUNTER — Encounter: Admit: 2021-08-18 | Discharge: 2021-08-18 | Payer: MEDICARE

## 2021-08-18 DIAGNOSIS — D849 Immunodeficiency, unspecified: Secondary | ICD-10-CM

## 2021-08-18 DIAGNOSIS — Z940 Kidney transplant status: Secondary | ICD-10-CM

## 2021-08-18 DIAGNOSIS — Z79899 Other long term (current) drug therapy: Secondary | ICD-10-CM

## 2021-08-18 LAB — CMV QUANT PCR-BLOOD

## 2021-08-19 ENCOUNTER — Encounter: Admit: 2021-08-19 | Discharge: 2021-08-19 | Payer: MEDICARE

## 2021-08-19 DIAGNOSIS — Z940 Kidney transplant status: Secondary | ICD-10-CM

## 2021-08-19 DIAGNOSIS — D849 Immunodeficiency, unspecified: Secondary | ICD-10-CM

## 2021-08-19 DIAGNOSIS — Z79899 Other long term (current) drug therapy: Secondary | ICD-10-CM

## 2021-08-19 LAB — URINALYSIS MICROSCOPIC REFLEX TO CULTURE

## 2021-08-19 LAB — URINALYSIS DIPSTICK REFLEX TO CULTURE

## 2021-08-19 LAB — PHOSPHORUS: PHOSPHORUS: 3.2

## 2021-08-19 LAB — COMPREHENSIVE METABOLIC PANEL: SODIUM: 140 mmol/L

## 2021-08-19 LAB — URIC ACID: URIC ACID: 4.3

## 2021-08-19 LAB — PROTEIN/CR RATIO,UR RAN: UR CREATININE, RAN: 163 — ABNORMAL HIGH (ref 47–110)

## 2021-08-19 LAB — MAGNESIUM: MAGNESIUM: 2

## 2021-08-20 ENCOUNTER — Encounter: Admit: 2021-08-20 | Discharge: 2021-08-20 | Payer: MEDICARE

## 2021-08-20 ENCOUNTER — Ambulatory Visit: Admit: 2021-08-20 | Discharge: 2021-08-20 | Payer: MEDICARE

## 2021-08-20 DIAGNOSIS — D849 Immunodeficiency, unspecified: Secondary | ICD-10-CM

## 2021-08-20 DIAGNOSIS — Z940 Kidney transplant status: Secondary | ICD-10-CM

## 2021-08-20 DIAGNOSIS — B348 Other viral infections of unspecified site: Secondary | ICD-10-CM

## 2021-08-20 DIAGNOSIS — Z79899 Other long term (current) drug therapy: Secondary | ICD-10-CM

## 2021-08-20 DIAGNOSIS — K3184 Gastroparesis: Secondary | ICD-10-CM

## 2021-08-20 DIAGNOSIS — I10 Essential (primary) hypertension: Secondary | ICD-10-CM

## 2021-08-20 DIAGNOSIS — E119 Type 2 diabetes mellitus without complications: Secondary | ICD-10-CM

## 2021-08-20 DIAGNOSIS — N289 Disorder of kidney and ureter, unspecified: Secondary | ICD-10-CM

## 2021-08-20 DIAGNOSIS — I639 Cerebral infarction, unspecified: Secondary | ICD-10-CM

## 2021-08-20 DIAGNOSIS — R413 Other amnesia: Secondary | ICD-10-CM

## 2021-08-20 LAB — HAPTOGLOBIN: HAPTOGLOBIN: 107 mg/dL (ref 16–200)

## 2021-08-20 LAB — LDH-LACTATE DEHYDROGENASE: LDH: 232 U/L — ABNORMAL HIGH (ref 100–210)

## 2021-08-20 MED ORDER — TACROLIMUS 1 MG PO CAP
2 mg | ORAL_CAPSULE | Freq: Two times a day (BID) | ORAL | 3 refills | Status: CN
Start: 2021-08-20 — End: ?

## 2021-08-20 MED FILL — WATER FOR INJECTION, STERILE IJ SOLN: 28 days supply | Qty: 10 | Fill #3 | Status: CP

## 2021-08-20 MED FILL — MYCOPHENOLATE SODIUM 180 MG PO TBEC: 180 mg | ORAL | 30 days supply | Qty: 180 | Fill #1 | Status: CP

## 2021-08-20 MED FILL — PENTAMIDINE 300 MG INH SOLR: 300 mg/6 mL | RESPIRATORY_TRACT | 28 days supply | Qty: 300 | Fill #3 | Status: CP

## 2021-08-20 NOTE — Telephone Encounter
Telephone number documented is incorrect. Unable to return call.

## 2021-08-20 NOTE — Patient Instructions
RTC Dr Lyndel Safe in    Labs weekly     Medication changes: increase prograf to 2/2

## 2021-08-20 NOTE — Telephone Encounter
Raquel Sarna called on behalf of the pt wanting to discuss home health needs versus what her INS will cover. Please call back.

## 2021-08-21 ENCOUNTER — Encounter: Admit: 2021-08-21 | Discharge: 2021-08-21 | Payer: MEDICARE

## 2021-08-21 DIAGNOSIS — Z940 Kidney transplant status: Secondary | ICD-10-CM

## 2021-08-21 DIAGNOSIS — Z79899 Other long term (current) drug therapy: Secondary | ICD-10-CM

## 2021-08-21 DIAGNOSIS — D849 Immunodeficiency, unspecified: Secondary | ICD-10-CM

## 2021-08-21 LAB — TACROLIMUS LC-MS/MS: TACROLIMUS LC-MS/MS: 4.5 — ABNORMAL LOW (ref 5.0–20.0)

## 2021-08-22 ENCOUNTER — Encounter: Admit: 2021-08-22 | Discharge: 2021-08-22 | Payer: MEDICARE

## 2021-08-22 DIAGNOSIS — Z79899 Other long term (current) drug therapy: Secondary | ICD-10-CM

## 2021-08-22 DIAGNOSIS — D849 Immunodeficiency, unspecified: Secondary | ICD-10-CM

## 2021-08-22 DIAGNOSIS — Z940 Kidney transplant status: Secondary | ICD-10-CM

## 2021-08-22 LAB — CMV QUANT PCR-BLOOD

## 2021-08-26 ENCOUNTER — Encounter: Admit: 2021-08-26 | Discharge: 2021-08-26 | Payer: MEDICARE

## 2021-08-26 DIAGNOSIS — Z79899 Other long term (current) drug therapy: Secondary | ICD-10-CM

## 2021-08-26 DIAGNOSIS — D849 Immunodeficiency, unspecified: Secondary | ICD-10-CM

## 2021-08-26 DIAGNOSIS — Z940 Kidney transplant status: Secondary | ICD-10-CM

## 2021-08-28 ENCOUNTER — Encounter: Admit: 2021-08-28 | Discharge: 2021-08-28 | Payer: MEDICARE

## 2021-08-28 NOTE — Telephone Encounter
Verified with provider that all medications should be renally dosed. Clindamycin for infected toe and podiatry referral placed. They are faxing notes

## 2021-08-28 NOTE — Telephone Encounter
[name], Dr. Alyson Locket office called regarding asking for recommendations of pt taking clindamycin 300mg . Transferred to [facility].

## 2021-09-04 ENCOUNTER — Encounter: Admit: 2021-09-04 | Discharge: 2021-09-04 | Payer: MEDICARE

## 2021-09-04 DIAGNOSIS — Z79899 Other long term (current) drug therapy: Secondary | ICD-10-CM

## 2021-09-04 DIAGNOSIS — Z940 Kidney transplant status: Secondary | ICD-10-CM

## 2021-09-04 DIAGNOSIS — D849 Immunodeficiency, unspecified: Secondary | ICD-10-CM

## 2021-09-04 LAB — CBC AND DIFF
ABSOLUTE BASO COUNT: 0 /uL (ref 0.01–0.08)
ABSOLUTE EOS COUNT: 0.1 /uL (ref 0.04–0.54)
ABSOLUTE LYMPH COUNT: 1.3 /uL (ref 1.18–3.74)
ABSOLUTE MONO COUNT: 0.5 /uL (ref 0.24–0.86)
ABSOLUTE NEUTROPHIL: 4.5 /uL (ref 1.56–6.13)
BASOPHILS: 0.6
EOSINOPHIL %: 2 % (ref 0.7–7.0)
HEMATOCRIT: 35 % (ref 34.1–44.9)
HEMOGLOBIN: 11 g/dL — ABNORMAL LOW (ref 11.2–15.7)
LYMPHOCYTES: 20 % (ref 19.3–53.1)
MCH: 30 pg (ref 25.6–32.2)
MCHC: 31 % — ABNORMAL LOW (ref 32.2–35.5)
MCV: 98 fL — ABNORMAL HIGH (ref 79.4–94.8)
MONOCYTES %: 7.9 % (ref 4.7–19.9)
MPV: 9.6
NEUTROPHILS %: 68 % (ref 34.0–71.1)
PLATELET COUNT: 265 (ref 182–369)
RBC COUNT: 3.5 uL — ABNORMAL LOW (ref 3.93–5.22)
RDW: 12
WBC COUNT: 6.5 uL (ref 3.98–10.04)

## 2021-09-04 LAB — COMPREHENSIVE METABOLIC PANEL
ALBUMIN: 3.9
ALK PHOSPHATASE: 55
ALT: 12
ANION GAP: 8
AST: 11
BLD UREA NITROGEN: 22 mg/dL — ABNORMAL HIGH (ref 7.0–18.7)
CALCIUM: 9.3
CHLORIDE: 105 mmol/L
CO2: 23 mmol/L
CREATININE: 1.1
GLUCOSE,RANDOM: 198 — ABNORMAL HIGH (ref 70–105)
POTASSIUM: 4.1 mmol/L
SODIUM: 136 mmol/L
TOTAL BILIRUBIN: 0.4
TOTAL PROTEIN: 7.1

## 2021-09-04 LAB — PHOSPHORUS: PHOSPHORUS: 3.3 (ref 2.3–4.7)

## 2021-09-04 LAB — MAGNESIUM: MAGNESIUM: 1.6 mg/dL (ref 1.6–2.6)

## 2021-09-04 LAB — URIC ACID: URIC ACID: 4.9 (ref 2.6–6.0)

## 2021-09-08 ENCOUNTER — Encounter: Admit: 2021-09-08 | Discharge: 2021-09-08 | Payer: MEDICARE

## 2021-09-08 DIAGNOSIS — D849 Immunodeficiency, unspecified: Secondary | ICD-10-CM

## 2021-09-08 DIAGNOSIS — Z940 Kidney transplant status: Secondary | ICD-10-CM

## 2021-09-08 DIAGNOSIS — Z79899 Other long term (current) drug therapy: Secondary | ICD-10-CM

## 2021-09-08 LAB — TACROLIMUS LC-MS/MS: TACROLIMUS LC-MS/MS: 5.6 ug/L (ref 5.0–20.0)

## 2021-09-08 LAB — PROTEIN/CR RATIO,UR RAN: UR CREATININE, RAN: 106 mg/dL (ref 47–110)

## 2021-09-08 LAB — URINALYSIS DIPSTICK REFLEX TO CULTURE
NITRITE: POSITIVE — AB
URINE BILE: NEGATIVE
URINE BLOOD: NEGATIVE
URINE KETONE: NEGATIVE
UROBILINOGEN: 0.2

## 2021-09-08 LAB — BK VIRUS DNA, QUANT PLASMA: BK VIRUS PLASMA QUANT: DETECTED — AB

## 2021-09-08 LAB — CMV QUANT PCR-BLOOD

## 2021-09-08 LAB — URINALYSIS MICROSCOPIC REFLEX TO CULTURE

## 2021-09-08 NOTE — Telephone Encounter
Reviewed labs from 09/03/2021 at this time    Hgb 11.0  WBC 6.59  Creatinine  1.11  Mg 1.6  Phos 3.3  Tacrolimus 5.6  (Goal 5-10)    Reviewed labs with patient at this time via mychart. Advised no dose change

## 2021-09-12 ENCOUNTER — Encounter: Admit: 2021-09-12 | Discharge: 2021-09-12 | Payer: MEDICARE

## 2021-09-12 MED FILL — MYCOPHENOLATE SODIUM 180 MG PO TBEC: 180 mg | ORAL | 30 days supply | Qty: 180 | Fill #2 | Status: AC

## 2021-09-13 ENCOUNTER — Encounter: Admit: 2021-09-13 | Discharge: 2021-09-13 | Payer: MEDICARE

## 2021-09-16 ENCOUNTER — Encounter: Admit: 2021-09-16 | Discharge: 2021-09-16 | Payer: MEDICARE

## 2021-09-16 ENCOUNTER — Ambulatory Visit: Admit: 2021-09-16 | Discharge: 2021-09-16 | Payer: MEDICARE

## 2021-09-16 DIAGNOSIS — R4189 Other symptoms and signs involving cognitive functions and awareness: Secondary | ICD-10-CM

## 2021-09-16 DIAGNOSIS — I10 Essential (primary) hypertension: Secondary | ICD-10-CM

## 2021-09-16 DIAGNOSIS — K3184 Gastroparesis: Secondary | ICD-10-CM

## 2021-09-16 DIAGNOSIS — N289 Disorder of kidney and ureter, unspecified: Secondary | ICD-10-CM

## 2021-09-16 DIAGNOSIS — E119 Type 2 diabetes mellitus without complications: Secondary | ICD-10-CM

## 2021-09-16 DIAGNOSIS — R413 Other amnesia: Secondary | ICD-10-CM

## 2021-09-16 DIAGNOSIS — I639 Cerebral infarction, unspecified: Secondary | ICD-10-CM

## 2021-09-16 DIAGNOSIS — B348 Other viral infections of unspecified site: Secondary | ICD-10-CM

## 2021-09-17 ENCOUNTER — Encounter: Admit: 2021-09-17 | Discharge: 2021-09-17 | Payer: MEDICARE

## 2021-09-17 DIAGNOSIS — D849 Immunodeficiency, unspecified: Secondary | ICD-10-CM

## 2021-09-17 DIAGNOSIS — Z940 Kidney transplant status: Secondary | ICD-10-CM

## 2021-09-17 DIAGNOSIS — Z79899 Other long term (current) drug therapy: Secondary | ICD-10-CM

## 2021-09-17 LAB — URINALYSIS DIPSTICK REFLEX TO CULTURE

## 2021-09-17 LAB — URINALYSIS MICROSCOPIC REFLEX TO CULTURE

## 2021-09-17 LAB — CBC AND DIFF: WBC COUNT: 5.6

## 2021-09-17 LAB — PROTEIN/CR RATIO,UR RAN: UR CREATININE, RAN: 79 — ABNORMAL LOW (ref 3.93–5.22)

## 2021-09-17 NOTE — Telephone Encounter
TC to patient at this time after receiving critical BS of 423.     TC to patient and support person Tess with no answer.     TC to Dr. [name] office and spoke to [employer]. Dr. Monia Pouch is out of office this week but has previously referred the patient to the ED for similar high BS. They will try to reach her as well. Mychart to patient advising to proceed to local ED.

## 2021-09-18 ENCOUNTER — Encounter: Admit: 2021-09-18 | Discharge: 2021-09-18 | Payer: MEDICARE

## 2021-09-18 DIAGNOSIS — D849 Immunodeficiency, unspecified: Secondary | ICD-10-CM

## 2021-09-18 DIAGNOSIS — Z79899 Other long term (current) drug therapy: Secondary | ICD-10-CM

## 2021-09-18 DIAGNOSIS — Z940 Kidney transplant status: Secondary | ICD-10-CM

## 2021-09-18 LAB — COMPREHENSIVE METABOLIC PANEL
ALBUMIN: 4.5 g/dL (ref 3.5–5.0)
ALK PHOSPHATASE: 65 U/L (ref 40–150)
ALT: 19 U/L (ref 0–55)
ANION GAP: 11 meq/L (ref 8–16)
AST: 11 U/L (ref 5–34)
BLD UREA NITROGEN: 29 mg/dL — ABNORMAL HIGH (ref 7.0–18.7)
CALCIUM: 9.8 mg/dL (ref 8.4–10.2)
CHLORIDE: 101 mmol/L (ref 98–107)
CO2: 21 mmol/L — ABNORMAL LOW (ref 22–29)
CREATININE: 1.2 — ABNORMAL HIGH (ref 0.57–1.11)
EGFR: 50 mL/Min/1.73 — ABNORMAL LOW (ref >59)
GLUCOSE,RANDOM: 423 mg/dL — ABNORMAL HIGH (ref 70–105)
POTASSIUM: 4.8 mmol/L (ref 3.5–5.1)
SODIUM: 133 mmol/L — ABNORMAL LOW (ref 136–145)
TOTAL BILIRUBIN: 0.7 mg/dL (ref 0.2–1.2)
TOTAL PROTEIN: 7.6 g/dL (ref 6.4–8.3)

## 2021-09-18 LAB — PHOSPHORUS: PHOSPHORUS: 3.9 mg/dL (ref 2.3–4.7)

## 2021-09-18 LAB — URIC ACID: URIC ACID: 4.8 mg/dL (ref 2.6–6.0)

## 2021-09-18 NOTE — Telephone Encounter
TC to support person Tess at this time. She did not receive my message yesterday to proceed to ER. Mychart message was not read by patient.     TC to patient with no answer, LVM for callback. Sent mychart message as well.     Patient had memory testing 09/16/2021:   Impression: Current neuropsychological findings were significant for a relatively isolated deficit in learning/memory.  Ms. Cillo performance was intact in most other areas, although there was possibly very mild weakness in complex attention and/or executive functions.  There was circumscribed improvement in recognition memory compared to the June 2021 evaluation, which may correspond with her subjective report of memory improvement, but there was continued evidence of significant memory impairment.  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  There did not appear to be any significant emotional distress at the time of this evaluation.  ?  Recommendations:  1) Ms. Yim was encouraged to follow up with Dr. Chales Abrahams and the kidney transplant team to discuss the neuropsychological findings in the context of her broader medical history.  2) individuals with similar memory performance often experience difficulty with some complex activities of daily living including management of medications.  Ms. Ingber reported that she was planning to obtain an electronic pill dispenser, which would likely be helpful.  Given the importance of maintaining close adherence post kidney transplant, she would also benefit from having a trusted individual regularly double check medication management to minimize the possibility for errors.  3) she is also encouraged to continue or perhaps increase her use of other compensatory strategies (e.g., calendars/planners, written notes, designated locations for important items, alarms as reminders for important tasks) to support memory.  With these supports, she is likely able to manage most routine daily tasks without assistance and she is encouraged to remain active.  4) neuropsychological reevaluation can be considered as needed if there is suspected cognitive change at some point in the future.  5) initial feedback was provided to Ms. Abdulmalik Darco with her friend present at the conclusion of the evaluation.  If there are further questions regarding the findings, we can be reached at [phone].    Per patient support person Tess: patient unable to utilize HERO monitor due to amount of meds she takes (HERO monitor allows 9, patient has 16). Patient does not qualify for home health as she does not have open wound or require injections. They are private paying a nurse Bonita Quin to come in and manage medications 3 hours/day and Tess feels this is working well. During memory testing, Tess reports that patient voiced lots of issues/concerns with Tess being involved in her care and states she plans to revoke Tess's DPOA. Kelsey/Elizabeth SW updated about this.

## 2021-09-19 ENCOUNTER — Encounter: Admit: 2021-09-19 | Discharge: 2021-09-19 | Payer: MEDICARE

## 2021-09-19 DIAGNOSIS — Z940 Kidney transplant status: Secondary | ICD-10-CM

## 2021-09-19 DIAGNOSIS — Z79899 Other long term (current) drug therapy: Secondary | ICD-10-CM

## 2021-09-19 DIAGNOSIS — D849 Immunodeficiency, unspecified: Secondary | ICD-10-CM

## 2021-09-19 LAB — TACROLIMUS LC-MS/MS: TACROLIMUS LC-MS/MS: 6.4

## 2021-09-19 NOTE — Telephone Encounter
Pt returning call to NC , lmsg to c/b

## 2021-09-20 ENCOUNTER — Encounter: Admit: 2021-09-20 | Discharge: 2021-09-20 | Payer: MEDICARE

## 2021-09-20 NOTE — Telephone Encounter
TC to patient after receiving on call page. Patient reports she has gained more than 3 lbs in 24 hours.     Per patient her weight yesterday was 192 and today and today it is 201. Confirmed with patient that this is a 9 lb weight gain. Patient confirmed. Asked patient if she weighed same time of day/same clothes/same scale. Patient confirmed.     Baseline weight per patient is 196-199. Re-iterated that patient reported weight 3/17 at 192. Patient confirmed.     Patient reports she was "out and about" all day yesterday and "ate a lot of stuff". Blood sugars in the previous week were reported critical in the 400s. Patient reports that "they are still high but not that high". Patient reports no pain, swelling, SOA associated with weight gain. BP 116/77 this am. No other symptoms associated with weight gain.     Plan to follow up with Patient Monday am for updated weight and assessment. Advised patient to limit salt in take and increase hydration. If patient develops swelling to please page on call again or if further weight gain when weighing on Sunday. Advised if she develops sudden SOA to proceed to closest ER. Patient acknowledged. No further questions/concerns.

## 2021-09-22 ENCOUNTER — Encounter: Admit: 2021-09-22 | Discharge: 2021-09-22 | Payer: MEDICARE

## 2021-09-22 DIAGNOSIS — Z79899 Other long term (current) drug therapy: Secondary | ICD-10-CM

## 2021-09-22 DIAGNOSIS — D849 Immunodeficiency, unspecified: Secondary | ICD-10-CM

## 2021-09-22 DIAGNOSIS — Z940 Kidney transplant status: Secondary | ICD-10-CM

## 2021-09-22 LAB — CMV QUANT PCR-BLOOD

## 2021-09-22 NOTE — Telephone Encounter
TC to patient and she reports her am weight today was 201 lbs. Patient denies swelling/SOA. BP 131/69.     Will continue to monitor. No need to expedite next clinic at this time. Confirmed via phone that patient received dose change and she wrote it on her white board.

## 2021-09-23 ENCOUNTER — Encounter: Admit: 2021-09-23 | Discharge: 2021-09-23 | Payer: MEDICARE

## 2021-09-23 MED ORDER — CARVEDILOL 25 MG PO TAB
ORAL_TABLET | ORAL | 0 refills | 90.00000 days | Status: AC
Start: 2021-09-23 — End: ?

## 2021-09-30 ENCOUNTER — Encounter: Admit: 2021-09-30 | Discharge: 2021-09-30 | Payer: MEDICARE

## 2021-09-30 MED ORDER — FEROSUL 325 MG (65 MG IRON) PO TAB
ORAL_TABLET | 0 refills
Start: 2021-09-30 — End: ?

## 2021-10-01 ENCOUNTER — Encounter: Admit: 2021-10-01 | Discharge: 2021-10-01 | Payer: Medicare Other

## 2021-10-01 ENCOUNTER — Ambulatory Visit: Admit: 2021-10-01 | Discharge: 2021-10-01 | Payer: Medicare Other

## 2021-10-01 ENCOUNTER — Ambulatory Visit: Admit: 2021-10-01 | Discharge: 2021-10-01 | Payer: MEDICARE

## 2021-10-01 DIAGNOSIS — Z79899 Other long term (current) drug therapy: Secondary | ICD-10-CM

## 2021-10-01 DIAGNOSIS — E119 Type 2 diabetes mellitus without complications: Secondary | ICD-10-CM

## 2021-10-01 DIAGNOSIS — B348 Other viral infections of unspecified site: Secondary | ICD-10-CM

## 2021-10-01 DIAGNOSIS — D849 Immunodeficiency, unspecified: Secondary | ICD-10-CM

## 2021-10-01 DIAGNOSIS — R413 Other amnesia: Secondary | ICD-10-CM

## 2021-10-01 DIAGNOSIS — Z940 Kidney transplant status: Secondary | ICD-10-CM

## 2021-10-01 DIAGNOSIS — N289 Disorder of kidney and ureter, unspecified: Secondary | ICD-10-CM

## 2021-10-01 DIAGNOSIS — I10 Essential (primary) hypertension: Secondary | ICD-10-CM

## 2021-10-01 DIAGNOSIS — K3184 Gastroparesis: Secondary | ICD-10-CM

## 2021-10-01 DIAGNOSIS — I639 Cerebral infarction, unspecified: Secondary | ICD-10-CM

## 2021-10-01 LAB — CBC AND DIFF
ABSOLUTE BASO COUNT: 0 K/UL (ref 0–0.20)
ABSOLUTE EOS COUNT: 0.1 K/UL (ref 0–0.45)
ABSOLUTE LYMPH COUNT: 1.3 K/UL (ref 1.0–4.8)
ABSOLUTE MONO COUNT: 0.4 K/UL (ref 0–0.80)
ABSOLUTE NEUTROPHIL: 4.2 K/UL (ref 1.8–7.0)
BASOPHILS %: 1 % (ref 0–2)
EOSINOPHILS %: 2 % (ref >60)
HEMATOCRIT: 34 % — ABNORMAL LOW (ref 36–45)
HEMOGLOBIN: 11 g/dL — ABNORMAL LOW (ref 12.0–15.0)
LYMPHOCYTES %: 22 % — ABNORMAL LOW (ref 24–44)
MCH: 30 pg (ref 26–34)
MCHC: 32 g/dL (ref 32.0–36.0)
MCV: 94 FL (ref 80–100)
MONOCYTES %: 8 % (ref 4–12)
MPV: 8 FL (ref 7–11)
NEUTROPHILS %: 67 % (ref 41–77)
PLATELET COUNT: 347 K/UL (ref 150–400)
RBC COUNT: 3.6 M/UL — ABNORMAL LOW (ref 4.0–5.0)
RDW: 13 % (ref 11–15)
WBC COUNT: 6.2 K/UL (ref 4.5–11.0)

## 2021-10-01 LAB — PROTEIN/CR RATIO,UR RAN
UR CREATININE, RAN: 125 mg/dL
UR TOTAL PROTEIN,RAN: 11 mg/dL

## 2021-10-01 LAB — FK506 TACROLIMUS: TACROLIMUS: 11 ng/mL (ref 5.0–15.0)

## 2021-10-01 LAB — URINALYSIS DIPSTICK REFLEX TO CULTURE
URINE BILE: NEGATIVE
URINE PH: 6 (ref 5.0–8.0)

## 2021-10-01 LAB — URINALYSIS MICROSCOPIC REFLEX TO CULTURE

## 2021-10-01 LAB — COMPREHENSIVE METABOLIC PANEL
POTASSIUM: 4.1 MMOL/L — AB (ref 3.5–5.1)
SODIUM: 139 MMOL/L (ref 137–147)

## 2021-10-01 LAB — URIC ACID: URIC ACID: 4.3 mg/dL (ref 2.0–7.0)

## 2021-10-01 LAB — MAGNESIUM: MAGNESIUM: 1.8 mg/dL — AB (ref 1.6–2.6)

## 2021-10-01 LAB — PHOSPHORUS: PHOSPHORUS: 3.3 mg/dL (ref <0.15)

## 2021-10-01 MED ORDER — MYCOPHENOLATE SODIUM 180 MG PO TBEC
180 mg | ORAL_TABLET | Freq: Two times a day (BID) | ORAL | 3 refills | Status: CN
Start: 2021-10-01 — End: ?

## 2021-10-01 MED ORDER — MYCOPHENOLATE SODIUM 180 MG PO TBEC
360 mg | ORAL_TABLET | Freq: Two times a day (BID) | ORAL | 3 refills | Status: DC
Start: 2021-10-01 — End: 2021-10-01
  Filled 2021-10-10: qty 60, 30d supply, fill #1

## 2021-10-01 MED FILL — PENTAMIDINE 300 MG INH SOLR: 300 mg/6 mL | 28 days supply | Qty: 300 | Fill #4 | Status: CP

## 2021-10-01 MED FILL — WATER FOR INJECTION, STERILE IJ SOLN: 28 days supply | Qty: 10 | Fill #4 | Status: CP

## 2021-10-01 NOTE — Telephone Encounter
Reviewed labs from 10/01/2021 at this time    Hgb 11.1  WBC 6.2  Creatinine  1.1  Mg 1.8  Phos 3.3  Tacrolimus  11.2 (Goal 8-12)  UPC 0.1    Received the following orders from Dr. Lyndel Safe :  No dose change     Reviewed labs with patient at this time via mychart   Medication list updated. New MAP emailed to patient.

## 2021-10-10 ENCOUNTER — Encounter: Admit: 2021-10-10 | Discharge: 2021-10-10 | Payer: Medicare Other

## 2021-10-10 MED FILL — TACROLIMUS 1 MG PO CAP: 1 mg | ORAL | 30 days supply | Qty: 150 | Fill #1 | Status: AC

## 2021-10-10 NOTE — Telephone Encounter
pt called in regards to +5lbs since yesterday , recently transplanted 156 days ago, notified NC Niki to c/b

## 2021-10-16 ENCOUNTER — Encounter: Admit: 2021-10-16 | Discharge: 2021-10-16 | Payer: Medicare Other

## 2021-10-16 DIAGNOSIS — D849 Immunodeficiency, unspecified: Secondary | ICD-10-CM

## 2021-10-16 DIAGNOSIS — Z79899 Other long term (current) drug therapy: Secondary | ICD-10-CM

## 2021-10-16 DIAGNOSIS — Z940 Kidney transplant status: Secondary | ICD-10-CM

## 2021-10-16 LAB — URINALYSIS MICROSCOPIC REFLEX TO CULTURE

## 2021-10-16 LAB — COMPREHENSIVE METABOLIC PANEL
ALBUMIN: 4.1
ALK PHOSPHATASE: 54
ALT: 21
ANION GAP: 10
AST: 15
CALCIUM: 9.8
CHLORIDE: 105 — ABNORMAL LOW (ref 34.1–44.9)
CHLORIDE: 105 — ABNORMAL LOW (ref 34.1–44.9)
CO2: 23 — ABNORMAL HIGH (ref 79.4–94.8)
CREATININE: 1.2 mg/dL — ABNORMAL HIGH (ref 0.57–1.11)
EGFR: 51 — ABNORMAL LOW (ref >59)
GLUCOSE,RANDOM: 126 — ABNORMAL HIGH (ref 70–105)
POTASSIUM: 4.7 — ABNORMAL LOW (ref 11.2–15.7)
POTASSIUM: 4.7 — ABNORMAL LOW (ref 11.2–15.7)
SODIUM: 138 mmol/L — ABNORMAL LOW (ref 3.93–5.22)
SODIUM: 138 mmol/L — ABNORMAL LOW (ref 3.93–5.22)
TOTAL BILIRUBIN: 0.3 mg/dL
TOTAL PROTEIN: 6.9

## 2021-10-16 LAB — PROTEIN/CR RATIO,UR RAN
PROT CREAT RAT/CAL: 0.1 FL — ABNORMAL HIGH (ref 80–100)
UR CREATININE, RAN: 94 g/dL — ABNORMAL HIGH (ref 13.5–16.5)
UR TOTAL PROTEIN,RAN: 9.9 % (ref 40–50)

## 2021-10-16 LAB — URINALYSIS DIPSTICK REFLEX TO CULTURE
GLUCOSE,UA: NEGATIVE
NITRITE: NEGATIVE
URINE BILE: NEGATIVE
URINE KETONE: NEGATIVE
UROBILINOGEN: 0.2

## 2021-10-16 LAB — PHOSPHORUS
PHOSPHORUS: 3.7
PHOSPHORUS: 3.7

## 2021-10-16 LAB — URIC ACID
URIC ACID: 4.9
URIC ACID: 4.9

## 2021-10-16 LAB — CBC AND DIFF
ABSOLUTE BASO COUNT: 0
ABSOLUTE EOS COUNT: 0.1
ABSOLUTE MONO COUNT: 0.4
WBC COUNT: 5.6
WBC COUNT: 5.6

## 2021-10-16 LAB — MAGNESIUM
MAGNESIUM: 1.6
MAGNESIUM: 1.6

## 2021-10-19 ENCOUNTER — Encounter: Admit: 2021-10-19 | Discharge: 2021-10-19 | Payer: Medicare Other

## 2021-10-19 NOTE — Telephone Encounter
Paged: pt weight is up has concerns    TC to patient. Weight up 5#. May have had additional Na+ yesterday. Discussed fluid intake, diet and exercise.    Routed to [facility]

## 2021-10-20 ENCOUNTER — Encounter: Admit: 2021-10-20 | Discharge: 2021-10-20 | Payer: Medicare Other

## 2021-10-20 DIAGNOSIS — Z940 Kidney transplant status: Secondary | ICD-10-CM

## 2021-10-20 DIAGNOSIS — Z79899 Other long term (current) drug therapy: Secondary | ICD-10-CM

## 2021-10-20 DIAGNOSIS — D849 Immunodeficiency, unspecified: Secondary | ICD-10-CM

## 2021-10-20 LAB — BK VIRUS DNA, QUANT PLASMA

## 2021-10-20 LAB — TACROLIMUS LC-MS/MS: TACROLIMUS LC-MS/MS: 4.2 — ABNORMAL LOW (ref 5.0–20.0)

## 2021-10-22 ENCOUNTER — Encounter: Admit: 2021-10-22 | Discharge: 2021-10-22 | Payer: Medicare Other

## 2021-10-22 DIAGNOSIS — Z79899 Other long term (current) drug therapy: Secondary | ICD-10-CM

## 2021-10-22 DIAGNOSIS — D849 Immunodeficiency, unspecified: Secondary | ICD-10-CM

## 2021-10-22 DIAGNOSIS — Z940 Kidney transplant status: Secondary | ICD-10-CM

## 2021-10-22 LAB — CMV QUANT PCR-BLOOD

## 2021-10-22 MED ORDER — CARVEDILOL 25 MG PO TAB
ORAL_TABLET | ORAL | 3 refills | 90.00000 days | Status: AC
Start: 2021-10-22 — End: ?

## 2021-10-23 ENCOUNTER — Encounter: Admit: 2021-10-23 | Discharge: 2021-10-23 | Payer: Medicare Other

## 2021-10-23 DIAGNOSIS — Z940 Kidney transplant status: Secondary | ICD-10-CM

## 2021-10-23 MED ORDER — TACROLIMUS 1 MG PO CAP
3 mg | ORAL_CAPSULE | Freq: Two times a day (BID) | ORAL | 3 refills | 30.00000 days | Status: AC
Start: 2021-10-23 — End: ?

## 2021-10-23 NOTE — Telephone Encounter
Reviewed labs from 10/16/2021 at this time    Hgb 10.6  WBC 5.62  Creatinine  1.22  Mg 1.6  Phos 3.7  Tacrolimus 4.2 (Goal 5-10)  UPC 0.1  BK not detected  CMV not detected     Received the following orders from Dr. Lyndel Safe :  Increase tacrolimus to 3/3 (from 2/3)     Reviewed labs with patient at this time via mychart  Gave dose change to patient at this time and asked for message back to confirm. Medication list updated

## 2021-10-24 ENCOUNTER — Encounter: Admit: 2021-10-24 | Discharge: 2021-10-24 | Payer: Medicare Other

## 2021-10-28 ENCOUNTER — Encounter: Admit: 2021-10-28 | Discharge: 2021-10-28 | Payer: Medicare Other

## 2021-10-29 ENCOUNTER — Ambulatory Visit: Admit: 2021-10-29 | Discharge: 2021-10-29 | Payer: Medicare Other

## 2021-10-29 ENCOUNTER — Ambulatory Visit: Admit: 2021-10-29 | Discharge: 2021-10-29 | Payer: MEDICARE

## 2021-10-29 ENCOUNTER — Encounter: Admit: 2021-10-29 | Discharge: 2021-10-29 | Payer: Medicare Other

## 2021-10-29 DIAGNOSIS — I10 Essential (primary) hypertension: Secondary | ICD-10-CM

## 2021-10-29 DIAGNOSIS — Z940 Kidney transplant status: Secondary | ICD-10-CM

## 2021-10-29 DIAGNOSIS — R7303 Prediabetes: Secondary | ICD-10-CM

## 2021-10-29 DIAGNOSIS — E119 Type 2 diabetes mellitus without complications: Secondary | ICD-10-CM

## 2021-10-29 DIAGNOSIS — Z79899 Other long term (current) drug therapy: Secondary | ICD-10-CM

## 2021-10-29 DIAGNOSIS — B348 Other viral infections of unspecified site: Secondary | ICD-10-CM

## 2021-10-29 DIAGNOSIS — N289 Disorder of kidney and ureter, unspecified: Secondary | ICD-10-CM

## 2021-10-29 DIAGNOSIS — K3184 Gastroparesis: Secondary | ICD-10-CM

## 2021-10-29 DIAGNOSIS — D849 Immunodeficiency, unspecified: Secondary | ICD-10-CM

## 2021-10-29 DIAGNOSIS — R413 Other amnesia: Secondary | ICD-10-CM

## 2021-10-29 DIAGNOSIS — I639 Cerebral infarction, unspecified: Secondary | ICD-10-CM

## 2021-10-29 LAB — URINALYSIS MICROSCOPIC REFLEX TO CULTURE

## 2021-10-29 LAB — CBC AND DIFF
HEMOGLOBIN: 11 g/dL — ABNORMAL LOW (ref 12.0–15.0)
MCH: 30 pg (ref 26–34)
MCHC: 32 g/dL (ref 32.0–36.0)
MCV: 93 FL (ref 80–100)
MONOCYTES %: 9 % (ref 4–12)
MPV: 7.6 FL (ref 7–11)
PLATELET COUNT: 327 K/UL (ref 150–400)
RBC COUNT: 3.7 M/UL — ABNORMAL LOW (ref 4.0–5.0)
RDW: 12 % (ref 11–15)

## 2021-10-29 LAB — THYROID STIMULATING HORMONE-TSH: TSH: 0.8 uU/mL (ref 0.35–5.00)

## 2021-10-29 LAB — URINALYSIS DIPSTICK REFLEX TO CULTURE
NITRITE: NEGATIVE
URINE ASCORBIC ACID, UA: NEGATIVE
URINE BILE: NEGATIVE
URINE BLOOD: NEGATIVE
URINE PH: 7 K/UL (ref 5.0–8.0)

## 2021-10-29 LAB — COMPREHENSIVE METABOLIC PANEL
ALT: 19 U/L — ABNORMAL LOW (ref 7–56)
CHLORIDE: 105 MMOL/L (ref 98–110)
CO2: 26 MMOL/L (ref 21–30)
CREATININE: 0.9 mg/dL — ABNORMAL LOW (ref 0.4–1.00)
POTASSIUM: 4.6 MMOL/L (ref 3.5–5.1)
SODIUM: 140 MMOL/L (ref 137–147)

## 2021-10-29 LAB — PHOSPHORUS: PHOSPHORUS: 2.6 mg/dL (ref 2.0–4.5)

## 2021-10-29 LAB — MAGNESIUM: MAGNESIUM: 1.7 mg/dL (ref 1.6–2.6)

## 2021-10-29 LAB — URIC ACID: URIC ACID: 4.5 mg/dL (ref 2.0–7.0)

## 2021-10-29 LAB — PROTEIN/CR RATIO,UR RAN
PROT CREAT RAT/CAL: 0.1 (ref <0.15)
UR CREATININE, RAN: 41 mg/dL — AB
UR TOTAL PROTEIN,RAN: 4 mg/dL (ref 0–0.20)

## 2021-10-29 LAB — FK506 TACROLIMUS: TACROLIMUS: 12 ng/mL (ref 5.0–15.0)

## 2021-10-29 MED ORDER — TACROLIMUS 1 MG PO CAP
2 mg | ORAL_CAPSULE | Freq: Two times a day (BID) | ORAL | 3 refills | 30.00000 days | Status: AC
Start: 2021-10-29 — End: ?

## 2021-10-29 MED FILL — WATER FOR INJECTION, STERILE IJ SOLN: 28 days supply | Qty: 10 | Fill #5 | Status: CP

## 2021-10-29 MED FILL — PENTAMIDINE 300 MG INH SOLR: 300 mg/6 mL | 28 days supply | Qty: 300 | Fill #5 | Status: CP

## 2021-10-29 NOTE — Progress Notes
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  Donor: DCD, IRD: Yes, Age: 48s, female, Ht 5'11. Wt 215 lbs. Creatinine Initial/Terminal. ?/1.8  KDPI: 47%, cPRA:? 75%  Match: VXM Negative, PXM -ve  CMV: D+/R+  Induction: thymoglobulin  Maintenance Immunosuppression: Triple drug therapy  Post OP: good UOP post op    Referring Nephrologist:  Lorre Munroe  [address] [place] [address]  Phone: [phone]  Fax: [phone]     Dear Dr. Lorre Munroe,    We had the pleasure of meeting Brandy Joseph in the [facility] for routine evaluation of her renal transplant.feels well. BP is WNL.BG up and down, concerned about weight gain. No Pain, D/N/V/Constipation, CP, SOB.recent illness or surgeries.     REVIEW OF SYSTEMS: Comprehensive 14-point ROS reviewed  Positives noted in HPI otherwise negative.    Past History:  Medical History:   Diagnosis Date   ? 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 DM (diabetes mellitus) (HCC)    ? Gastroparesis    ? Hypertension    ? Kidney disease    ? Memory loss    ? Stroke [facility])        Surgical History:   Procedure Laterality Date   ? ANKLE SURGERY Left 1996   ? FIBULA FRACTURE SURGERY Left 1996   ? ESOPHAGOGASTRODUODENOSCOPY WITH BIOPSY - FLEXIBLE N/A 03/29/2018    Performed by Virgina Organ, MD at [facility] ENDO   ? ESOPHAGOGASTRODUODENOSCOPY WITH BIOPSY - FLEXIBLE N/A 10/04/2019    Performed by Everardo All, MD at [facility] ENDO   ? COLONOSCOPY WITH BIOPSY - FLEXIBLE N/A 10/04/2019    Performed by Everardo All, MD at [facility] ENDO   ? ANGIOGRAPHY CORONARY ARTERY WITH LEFT HEART CATHETERIZATION N/A 01/18/2020    Performed by Reola Mosher, MD at [facility] CATH LAB   ? POSSIBLE PERCUTANEOUS CORONARY STENT PLACEMENT WITH ANGIOPLASTY N/A 01/18/2020    Performed by Reola Mosher, MD at [facility] CATH LAB   ? COLONOSCOPY WITH BIOPSY - FLEXIBLE N/A 05/15/2020    Performed by Eliott Nine, MD at [facility] ENDO   ? COLONOSCOPY WITH SNARE REMOVAL TUMOR/ POLYP/ OTHER LESION  05/15/2020    Performed by Eliott Nine, MD at [facility] ENDO   ? ALLOTRANSPLANTATION KIDNEY FROM NON LIVING DONOR WITHOUT RECIPIENT NEPHRECTOMY N/A 05/07/2021    Performed by Nicholos Johns, MD at [facility] OR   ? BURR HOLE      release pressure from stroke   ? DIALYSIS FISTULA CREATION     ? GALLBLADDER SURGERY     ? HX CESAREAN SECTION         Social History     Socioeconomic History   ? Marital status: Divorced   ? Number of children: 1   ? Highest education level: Associate degree: occupational, Scientist, product/process development, or vocational program   Tobacco Use   ? Smoking status: Never   ? Smokeless tobacco: Never   Vaping Use   ? Vaping Use: Never used   Substance and Sexual Activity   ? Alcohol use: Not Currently     Alcohol/week: 0.0 standard drinks     Comment: rare, socially   ? Drug use: Never   ? Sexual activity: Yes  Partners: Male     Birth control/protection: None, Surgical       Family History   Problem Relation Age of Onset   ? Stroke Mother    ? Diabetes Paternal Grandmother    ? Heart problem Paternal Grandmother    ? Hypertension Paternal Grandmother    ? Tumor Maternal Grandmother         brain   ? None Reported Son        Allergies   Allergen Reactions   ? Ketorolac RASH   ? Sulfa (Sulfonamide Antibiotics) EDEMA     Full body swelling; eye swelling; itchiness       Current Medications:    Current Outpatient Medications:   ?  albuterol sulfate (PROAIR HFA) 90 mcg/actuation HFA aerosol inhaler, Use as directed prior to pentamide treatments. Shake well before use, Disp: 6.7 g, Rfl: 0  ?  aspirin EC (ASPIR-LOW) 81 mg tablet, Take one tablet by mouth daily. Take with food., Disp: 90 tablet, Rfl:   ?  biotin 5 mg tablet, Take one tablet by mouth daily., Disp: 90 tablet, Rfl: 3  ?  buPROPion XL (WELLBUTRIN XL) 150 mg tablet, Take one tablet by mouth every morning. Do not crush or chew., Disp: , Rfl:   ?  carvediloL (COREG) 25 mg tablet, TAKE 1 AND 1/2 TABLETS BY MOUTH TWICE DAILY WITH FOOD, Disp: 270 tablet, Rfl: 3  ?  cetirizine (ZYRTEC) 10 mg tablet, Take one tablet by mouth daily., Disp: , Rfl:   ?  CHOLEcalciferoL (vitamin D3) (VITAMIN D3) 50 mcg (2,000 unit) tablet, Take one tablet by mouth daily., Disp: 90 tablet, Rfl: 3  ?  docusate (COLACE) 100 mg capsule, Take one capsule by mouth twice daily., Disp: 180 capsule, Rfl: 1  ?  ferrous sulfate (FEROSUL) 325 mg (65 mg iron) tablet, Take one tablet by mouth daily., Disp: 90 tablet, Rfl: 3  ?  insulin lispro(+) (HUMALOG U-100 INSULIN) 100 unit/mL injection, Maximum of 50 units daily. Via insulin pump. Use 1:6 insulin to carb ratio and the following correction factor Glucose 140-180 = 1 units Glucose 181-220 = 2 units Glucose 221-260 = 3 units Glucose 261-300 = 4 units Glucose 301-350 = 5 units Glucose 351-400 = 6 units Glucose >400 = 7 units, Disp: 3 vial, Rfl: 3  ?  insulin pen needles (disposable) (BD UF NANO PEN NEEDLES) 32 gauge x 5/32 pen needle, Use one each as directed as Needed. Use with insulin injections., Disp: 300 each, Rfl: 0  ?  Insulin Syringe-Needle U-100 (BD INSULIN SYRINGE ULTRA-FINE) 1 mL 31 gauge x 5/16 syrg, Use with insulin vials as directed, Disp: 100 each, Rfl: 1  ?  lisinopril (ZESTRIL) 5 mg tablet, Take one tablet by mouth at bedtime daily. Indications: high blood pressure, Disp: 30 tablet, Rfl: 2  ?  magnesium oxide (MAGOX) 400 mg (241.3 mg magnesium) tablet, Take two tablets by mouth daily., Disp: 30 tablet, Rfl: 5  ?  mycophenolate sodium (MYFORTIC) 180 mg tablet,delayed release, Take one tablet by mouth twice daily., Disp: 180 tablet, Rfl: 3  ?  pantoprazole DR (PROTONIX) 40 mg tablet, Take one tablet by mouth daily., Disp: 30 tablet, Rfl: 1  ?  pentamidine (NEBUPENT) 300 mg/6 mL nebulizer solution, Inhale 6 mL solution by nebulizer as directed every 28 days., Disp: 1 each, Rfl: 4  ? predniSONE (DELTASONE) 5 mg tablet, Take one tablet by mouth daily., Disp: 90 tablet, Rfl: 3  ?  rosuvastatin (CRESTOR) 20 mg  tablet, Take one-half tablet by mouth daily., Disp: 15 tablet, Rfl: 1  ?  tacrolimus (PROGRAF) 1 mg capsule, Take three capsules by mouth twice daily., Disp: 540 capsule, Rfl: 3  ?  tamsulosin (FLOMAX) 0.4 mg capsule, Take one capsule by mouth daily after breakfast. Do not crush, chew or open capsules. Take 30 minutes following the same meal each day., Disp: 30 capsule, Rfl: 1  ?  water (sterile) for injection (STERILE WATER FOR INJECTION) injection, For use with Nebupent, Disp: 10 mL, Rfl: 4    Physical Exam:  Vitals:    10/29/21 1013 10/29/21 1014 10/29/21 1104   BP: (!) 146/59 (!) 145/65    BP Source: Arm, Left Upper Arm, Left Upper    Pulse: 76 83    Temp: 36.7 ?C (98 ?F)     SpO2: 100%     O2 Device:   None (Room air)   TempSrc: Oral     PainSc: Zero     Weight: 96.3 kg (212 lb 6.4 oz)     Height: 167.6 cm (5' 6)     Body mass index is 34.28 kg/m?Marland Kitchen  General: NAD, A+Ox4, calm and pleasant. Appears to be stated age. Walker  HENT: Unremarkable, no oral lesions  Neck: Normal ROM, no LAD, no JVD  Lungs: Bilat. CTA  CV: RRR, S1, S2 without carotid bruit or murmur, no edema  Abdomen: Soft, N/D, N/T without hepatosplenomegaly  Incision: healing  M/S: Normal ROM and strength  Neuro: Nonfocal deficits without tremors  Skin: No skin rash  Psyc: Stable  Dialysis Access: LUE AVF/AVG good thrill/bruit  Laboratory studies:     CMP:  CMP Latest Ref Rng & Units 10/29/2021 10/16/2021 10/16/2021 10/01/2021 09/17/2021   NA 137 - 147 MMOL/L 140 138 138 139 133(L)   K 3.5 - 5.1 MMOL/L 4.6 4.7 4.7 4.1 4.8   CL 98 - 110 MMOL/L 105 105 105 105 101   CO2 21 - 30 MMOL/L 26 23.0 23.0 26 21.0(L)   GAP 3 - 12 9 10 10 8 11    BUN 7 - 25 MG/DL 20 22.0(H) 22.0(H) 23 29.0(H)   CR 0.4 - 1.00 MG/DL 1.61 0.96(E) 4.54(U) 9.81(X) 1.25(H)   GLUX 70 - 100 MG/DL 914(N) - - 829(F) -   CA 8.5 - 10.6 MG/DL 9.8 9.8 9.8 9.5 9.8   TP 6.0 - 8.0 G/DL 7.4 6.9 6.9 7.3 7.6   ALB 3.5 - 5.0 G/DL 4.5 4.1 4.1 4.3 4.5   ALKP 25 - 110 U/L 48 54 54 50 65   ALT 7 - 56 U/L 19 21 21 16 19    TBILI 0.3 - 1.2 MG/DL 0.5 6.21 3.08 0.4 6.57   GFR >60 mL/min - - - - -   GFRAA >60 mL/min - - - - -     Hemoglobin A1C (%)   Date Value   07/30/2021 8.3 (H)   05/07/2021 7.4 (H)   12/26/2020 10.5 (H)     PTH Hormone (PG/ML)   Date Value   07/30/2021 54.6   05/07/2021 396.7 (H)     Lipase   Date Value Ref Range Status   05/16/2021 5 (L) 11 - 82 U/L Final   10/01/2019 31 11 - 82 U/L Final     No results found for: AMY  BK Virus Plasma Quant (no units)   Date Value   10/01/2021 160 (H)   09/17/2021 <500 (A)   09/03/2021 Detected (A)   08/18/2021 DETECTED  08/05/2021 210 (H)     CMV DNA Quant PCR   Date Value   10/16/2021 NOT DETECTED   10/01/2021 CMV DNA NOT DETECTED [IU]/mL   09/17/2021 NOT DETECTED   09/03/2021 NOT DETECTED Log IU/mL   08/18/2021 NOT DETECTED Log IU/mL     IU/mL CMV Blood (no units)   Date Value   10/01/2021     <50 IU/mL  The test method detects and quantitates CMV DNA using the Abbott RealTime assay,   and is approved by the FDA for monitoring hematopoietic stem cell transplant   patients who are undergoing anti-CMV therapy.  Please correlate results with the   clinical status of the patient.     08/05/2021     <50 IU/mL  The test method detects and quantitates CMV DNA using the Abbott RealTime assay,   and is approved by the FDA for monitoring hematopoietic stem cell transplant   patients who are undergoing anti-CMV therapy.  Please correlate results with the   clinical status of the patient.     07/30/2021     <50 IU/mL  The test method detects and quantitates CMV DNA using the Abbott RealTime assay,   and is approved by the FDA for monitoring hematopoietic stem cell transplant   patients who are undergoing anti-CMV therapy.  Please correlate results with the   clinical status of the patient.     07/09/2021     <50 IU/mL  The test method detects and quantitates CMV DNA using the Abbott RealTime assay,   and is approved by the FDA for monitoring hematopoietic stem cell transplant   patients who are undergoing anti-CMV therapy.  Please correlate results with the   clinical status of the patient.     06/25/2021     <50 IU/mL  The test method detects and quantitates CMV DNA using the Abbott RealTime assay,   and is approved by the FDA for monitoring hematopoietic stem cell transplant   patients who are undergoing anti-CMV therapy.  Please correlate results with the   clinical status of the patient.       BK Virus Plasma Copies/ML (no units)   Date Value   10/16/2021 NOT DETECTED   07/02/2021 <500 (A)     No results found for: EBVDNAQT    TACROLIMUS LEVEL:  Tacrolimus Immunoassay (ng/mL)   Date Value   10/29/2021 12.8   10/01/2021 11.2   08/06/2021 14.2   07/30/2021 8.4       CBC with Diff:  CBC with Diff Latest Ref Rng & Units 10/29/2021 10/16/2021   WBC 4.5 - 11.0 K/UL 6.1 5.62   RBC 4.0 - 5.0 M/UL 3.76(L) 3.47(L)   HGB 12.0 - 15.0 GM/DL 11.5(L) 10.6(L)   HCT 36 - 45 % 35.3(L) 33.1(L)   MCV 80 - 100 FL 93.9 95.4(H)   MCH 26 - 34 PG 30.6 30.5   MCHC 32.0 - 36.0 G/DL 16.1 32.0(L)   RDW 11 - 15 % 12.6 11.9   PLT 150 - 400 K/UL 327 243   MPV 7 - 11 FL 7.6 9.7   NEUT 41 - 77 % 68 64.3   ANC 1.8 - 7.0 K/UL 4.16 3.61   LYMA 24 - 44 % 20(L) -   ALYM 1.0 - 4.8 K/UL 1.23 1.39   MONA 4 - 12 % 9 -   AMONO 0 - 0.80 K/UL 0.54 0.46   EOSA 0 - 5 % 2 -   AEOS 0 -  0.45 K/UL 0.13 0.12   BASA 0 - 2 % 1 -   ABAS 0 - 0.20 K/UL 0.04 0.03     Lab Results   Component Value Date/Time    IRON 142 07/30/2021 09:58 AM    TIBC 335 07/30/2021 09:58 AM    PSAT 42 07/30/2021 09:58 AM    FERRITIN 352 (H) 07/30/2021 09:58 AM    FERRITIN 1,156 (H) 05/09/2021 05:36 AM       Urinalysis:  Lab Results   Component Value Date/Time    UCOLOR STRAW 10/29/2021 09:44 AM    TURBID CLEAR 10/29/2021 09:44 AM    USPGR 1.010 10/29/2021 09:44 AM    UPH 7.0 10/29/2021 09:44 AM    UPROTEIN NEG 10/29/2021 09:44 AM UAGLU 2+ (A) 10/29/2021 09:44 AM    UKET NEG 10/29/2021 09:44 AM    UBILE NEG 10/29/2021 09:44 AM    UBLD NEG 10/29/2021 09:44 AM    UROB NORMAL 10/29/2021 09:44 AM     Protein/CR ratio (no units)   Date Value   10/29/2021 0.1   10/16/2021 0.10432   10/16/2021 1.61096   10/01/2021 0.1   09/17/2021 0.45409       Imaging:  Results for orders placed during the hospital encounter of 08/05/21    CTA CHEST PULM EMBOLISM W/CONT    Impression  1.  Limited opacification of subsegmental pulmonary arteries without imaging features suggestive of acute pulmonary embolism. No evidence of right heart strain.  2.  Several unchanged scattered bilateral pulmonary nodules, likely scarring/granulomas. Dedicated follow-up is not indicated in the absence of known malignancy.  3.  Nonobstructing left upper pole renal calculus.    By my electronic signature, I attest that I have personally reviewed the images for this examination and formulated the interpretations and opinions expressed in this report      Finalized by Dimple Casey, M.D. on 08/05/2021 8:41 PM. Dictated by Dion Body, M.D. on 08/05/2021 7:58 PM.    Results for orders placed during the hospital encounter of 08/05/21    ABDOMEN 1 VIEW    Impression  Findings/Impression:    2 supine frontal x-ray images of the abdomen/pelvis were obtained. No dilated bowel loops are visualized. Prior cholecystectomy. Mass-effect in the right pelvis region is present due to the known right pelvis renal transplant. There are several tiny foci of calcification projected over the pelvis that most likely represent phleboliths. Calcific arteriosclerosis.      Finalized by Rosilyn Mings, M.D. on 08/05/2021 10:44 AM. Dictated by Rosilyn Mings, M.D. on 08/05/2021 10:42 AM.    Assessment and Plan:  Brandy Joseph is a 40 y.o. 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   ?  #) Immunosuppression - On maintenance Triple drug therapy  - Prograf to goal 10 by MEIA, MPA 360 mg BID-(BK) and steroid pulse to wean to prednisone 5 mg daily  - No recent missed doses or late doses since last week  ?  #) Deceased donor renal transplant: bl Cr TBD mg/dL, bl UPCR TBD gm/gm (pre Tx UPCR bl 1.3 gm/gm)   - Cr ~1  ?  #) ID Proph  - CMV Donor(+)/ Recipient(+) to treat with Valcyte for 3 months post transplant  - Keflex for urinary prophylaxis, pentamidine for PJP. Dose rec'd today   - the patient has Hx of recurrent UTI and Hx of neurogenic bladder.  - MPA reduced to 360mg  BID for BK   ?  #)  BPs:   - Coreg 37.5mg  BID, Clonidine 0.2 mg bid  ?  #) Diabetes  - BGs have been labile on BMPs  - on insulin pump  - HgA1C 8.3% today  - follows with Endocrinology  ?  #) Hx of CVA   - with residual memory loss and RLE weakness.  - start baby ASA/ cont Rosuvastatin  ?  #) Anemia - Hgb average of 10-12s  - at goal, monitor   - Iron studies: Iron sufficient on 05/09/2021  ?  #) CKD-MBD  - PTH?396 on 05/07/2021  - Vitamin D?16.5 on 11/2 - Completed 25-Vit.D 50 000 unit weekly. Start 2000 international unit(s) once daily  - Ca acceptable  - PO4 acceptable- d/c phos NaK    #) Memory issues  - unable to remember meds, timings etc  - uses phone alarm    #) Anxiety/depression  - Cont Wellbutrin XL    #) General health maint. Managed by PCP   Skin cancer surveillance: Annual follow up with Dermatology for skin eval due to long term immunosuppression   Cancer screening: Mammogram, Pap Smear, colonoscopy per guidelines   Annual high dose flu vaccine    Avoid all live vaccines. When clinically indicated or age appropriate, can receive the Shingrix vaccine one year post transplant.      Vaccinated for COVID-19 [Moderna] x 4    RTC in [facility]   Labs 2 weekly   We addressed polypharmacy today   Recommend initiating follow up with local nephrologist     Sincerely,    Corena Herter, MD  Transplant Nephrology    Cc: Thomas Alderson  Cc: Christene Lye Darland    Please contact the [facility] at [phone] for any transplant related questions or concerns that may arise.

## 2021-10-29 NOTE — Telephone Encounter
Reviewed labs from 10/29/2021 at this time    Hgb 11.5  WBC 6.1  Creatinine  0.98  Mg 1.7  Phos 2.6  Tacrolimus 12.8 (Goal 5-10)  UPC 0.1  TSH 0.84    Received the following orders from Dr. Lyndel Safe :  Reduce tacrolimus to 2/2 (from 3/3)   Labs every two weeks     Reviewed labs with patient at this time via mychart   Gave dose change to patient at this time and asked for message back to confirm. Medication list updated. New MAP generated and emailed to patient. Dr. [name] nurse Almyra Free updated about patient desire for wegovy and will review with him and update patient.

## 2021-10-30 ENCOUNTER — Encounter: Admit: 2021-10-30 | Discharge: 2021-10-30 | Payer: Medicare Other

## 2021-10-31 ENCOUNTER — Encounter: Admit: 2021-10-31 | Discharge: 2021-10-31 | Payer: Medicare Other

## 2021-10-31 NOTE — Telephone Encounter
Incoming TC from patient at this time. Patient reports 11 pound weight gain in the last 4 days. Asked patient current weight and she stated 212 lbs. Weight on 4/26 clinic visit was 212 lbs. Brandy Joseph reports that her weight on Monday was 201. TC to support person Brandy Joseph who confirmed patient must have made a mistake while documenting as baseline weight is 211-212. Patient denies swelling and SOA.     Reviewed with Dr. Lyndel Safe and no intervention necessary  Per Dr. Lyndel Safe ok to stop measuring daily weight and BP. Patient notified via phone and mychart.

## 2021-10-31 NOTE — Telephone Encounter
Pt called she retaining fluid. Transferred to [facility].

## 2021-11-03 ENCOUNTER — Encounter: Admit: 2021-11-03 | Discharge: 2021-11-03 | Payer: Medicare Other

## 2021-11-04 NOTE — Progress Notes
2203-Joined with Sydnee Cabal. Discussed the patient.  Patient only had nausea, no other sx's.   Venous PH-7.39  Lactate-1.6  4/13 labs-K-4.7/ Creat-1.22-->tonight-K-5.2/ BUN-29/Creat-1.35  U/A-3-10 WBC; 11-25 WBC; + yeast; ; (-)=nitrite and leuk esterase;  patient is asymptomatic.  Will not treat at this time since she is asymptomatic.  The patient can go home.   Have the patient follow up with the renal clinic.

## 2021-11-04 NOTE — Progress Notes
New call for a [place] renal transplant in 05/2021.  Came to ER for elevated BS-450.  Patient was unsure if her pump was working and has no Insulin at home.  Had been told to go to ER if BS >300.  Initial BS-450-->Insulin 4 u's regular IV-->1 L IVF-->167.  Patient feeling OK  AAOX3  VSS  K-5.2 slight elevation in Creat.  Patient and MD wanting to discuss with the tx team.

## 2021-11-05 ENCOUNTER — Encounter: Admit: 2021-11-05 | Discharge: 2021-11-05 | Payer: Medicare Other

## 2021-11-05 NOTE — Telephone Encounter
Pt called stating that she accidentally took an extra Myfortic pill and half of the Carvedilol. Would like to know what she needs to do. Please call back

## 2021-11-05 NOTE — Telephone Encounter
Patient took 1/2 tablet of carvedilol this am and 2-180 mg tablets of myfortic. Advised patient to take additional 25 mg carvedilol tablet to equal normal dosing and that additional pill of myfortic was ok this time. Advised patient to make sure pill box is correct.

## 2021-11-13 ENCOUNTER — Encounter: Admit: 2021-11-13 | Discharge: 2021-11-13 | Payer: Medicare Other

## 2021-11-13 DIAGNOSIS — Z79899 Other long term (current) drug therapy: Secondary | ICD-10-CM

## 2021-11-13 DIAGNOSIS — Z940 Kidney transplant status: Secondary | ICD-10-CM

## 2021-11-13 DIAGNOSIS — D849 Immunodeficiency, unspecified: Secondary | ICD-10-CM

## 2021-11-14 ENCOUNTER — Encounter: Admit: 2021-11-14 | Discharge: 2021-11-14 | Payer: Medicare Other

## 2021-11-14 DIAGNOSIS — D849 Immunodeficiency, unspecified: Secondary | ICD-10-CM

## 2021-11-14 DIAGNOSIS — E1010 Type 1 diabetes mellitus with ketoacidosis without coma: Secondary | ICD-10-CM

## 2021-11-14 DIAGNOSIS — Z940 Kidney transplant status: Secondary | ICD-10-CM

## 2021-11-14 DIAGNOSIS — Z79899 Other long term (current) drug therapy: Secondary | ICD-10-CM

## 2021-11-14 LAB — THYROID STIMULATING HORMONE-TSH: TSH: 1.1 m[IU]/mL

## 2021-11-17 ENCOUNTER — Encounter: Admit: 2021-11-17 | Discharge: 2021-11-17 | Payer: Medicare Other

## 2021-11-18 ENCOUNTER — Encounter: Admit: 2021-11-18 | Discharge: 2021-11-18 | Payer: Medicare Other

## 2021-11-18 DIAGNOSIS — D849 Immunodeficiency, unspecified: Secondary | ICD-10-CM

## 2021-11-18 DIAGNOSIS — Z940 Kidney transplant status: Secondary | ICD-10-CM

## 2021-11-18 DIAGNOSIS — Z79899 Other long term (current) drug therapy: Secondary | ICD-10-CM

## 2021-11-18 LAB — BK VIRUS DNA, QUANT PLASMA

## 2021-11-18 LAB — TACROLIMUS LC-MS/MS: TACROLIMUS LC-MS/MS: 6.8 g/dL — ABNORMAL LOW (ref 5.0–20.0)

## 2021-11-19 ENCOUNTER — Encounter: Admit: 2021-11-19 | Discharge: 2021-11-19 | Payer: Medicare Other

## 2021-11-19 DIAGNOSIS — Z940 Kidney transplant status: Secondary | ICD-10-CM

## 2021-11-19 DIAGNOSIS — D849 Immunodeficiency, unspecified: Secondary | ICD-10-CM

## 2021-11-19 DIAGNOSIS — Z79899 Other long term (current) drug therapy: Secondary | ICD-10-CM

## 2021-11-19 LAB — CMV QUANT PCR-BLOOD

## 2021-11-26 ENCOUNTER — Encounter: Admit: 2021-11-26 | Discharge: 2021-11-26 | Payer: Medicare Other

## 2021-11-27 ENCOUNTER — Encounter: Admit: 2021-11-27 | Discharge: 2021-11-27 | Payer: Medicare Other

## 2021-11-27 DIAGNOSIS — D849 Immunodeficiency, unspecified: Secondary | ICD-10-CM

## 2021-11-27 DIAGNOSIS — Z940 Kidney transplant status: Secondary | ICD-10-CM

## 2021-11-27 DIAGNOSIS — Z79899 Other long term (current) drug therapy: Secondary | ICD-10-CM

## 2021-11-27 LAB — URIC ACID: URIC ACID: 4.8 mg/dL (ref 2.6–6.0)

## 2021-11-27 LAB — MAGNESIUM: MAGNESIUM: 2.2 mg/dL (ref 1.6–2.6)

## 2021-11-27 LAB — PHOSPHORUS: PHOSPHORUS: 2.8 mg/dL (ref 2.3–4.7)

## 2021-11-27 LAB — COMPREHENSIVE METABOLIC PANEL: SODIUM: 139 mmol/L (ref 136–145)

## 2021-12-02 ENCOUNTER — Encounter: Admit: 2021-12-02 | Discharge: 2021-12-02 | Payer: Medicare Other

## 2021-12-02 DIAGNOSIS — D849 Immunodeficiency, unspecified: Secondary | ICD-10-CM

## 2021-12-02 DIAGNOSIS — Z79899 Other long term (current) drug therapy: Secondary | ICD-10-CM

## 2021-12-02 DIAGNOSIS — Z940 Kidney transplant status: Secondary | ICD-10-CM

## 2021-12-02 LAB — TACROLIMUS LC-MS/MS: TACROLIMUS LC-MS/MS: 6 ug/L (ref 5.0–20.0)

## 2021-12-02 LAB — CMV QUANT PCR-BLOOD

## 2021-12-03 ENCOUNTER — Encounter: Admit: 2021-12-03 | Discharge: 2021-12-03 | Payer: Medicare Other

## 2021-12-03 NOTE — Telephone Encounter
[facility] called regarding the pt, they are wanting to remove the medial border the toe nail and are wanting to know if they can give the pt 3cc of Lidocain.    Transferred to [place]

## 2021-12-10 ENCOUNTER — Encounter: Admit: 2021-12-10 | Discharge: 2021-12-10 | Payer: Medicare Other

## 2021-12-10 DIAGNOSIS — Z940 Kidney transplant status: Secondary | ICD-10-CM

## 2021-12-10 DIAGNOSIS — Z79899 Other long term (current) drug therapy: Secondary | ICD-10-CM

## 2021-12-10 DIAGNOSIS — D849 Immunodeficiency, unspecified: Secondary | ICD-10-CM

## 2021-12-10 LAB — CBC AND DIFF
ABSOLUTE EOS COUNT: 0
ABSOLUTE LYMPH COUNT: 0.8 — ABNORMAL LOW (ref 1.18–3.74)
ABSOLUTE MONO COUNT: 0.5
ABSOLUTE NEUTROPHIL: 4.1
BASOPHILS: 0.4
EOSINOPHIL %: 1.4
HEMATOCRIT: 35
HEMOGLOBIN: 11
LYMPHOCYTES: 15 — ABNORMAL LOW (ref 19.3–53.1)
MCH: 29 — ABNORMAL HIGH (ref 70–105)
MCHC: 31 mg/dL — ABNORMAL LOW (ref 32.2–35.5)
MCV: 94
MONOCYTES %: 8.9
MPV: 9.3 mg/dL — ABNORMAL LOW (ref 9.4–12.4)
NEUTROPHILS %: 73 — ABNORMAL HIGH (ref 34.0–71.1)
PLATELET COUNT: 291
RBC COUNT: 3.8 — ABNORMAL LOW (ref 3.93–5.22)
RDW: 12
WBC COUNT: 5.6

## 2021-12-10 LAB — PHOSPHORUS: PHOSPHORUS: 2.7

## 2021-12-10 LAB — MAGNESIUM: MAGNESIUM: 1.7

## 2021-12-10 LAB — COMPREHENSIVE METABOLIC PANEL: SODIUM: 136 mmol/L

## 2021-12-10 LAB — URIC ACID: URIC ACID: 4.9

## 2021-12-11 ENCOUNTER — Encounter: Admit: 2021-12-11 | Discharge: 2021-12-11 | Payer: Medicare Other

## 2021-12-11 DIAGNOSIS — Z940 Kidney transplant status: Secondary | ICD-10-CM

## 2021-12-11 DIAGNOSIS — Z79899 Other long term (current) drug therapy: Secondary | ICD-10-CM

## 2021-12-11 DIAGNOSIS — D849 Immunodeficiency, unspecified: Secondary | ICD-10-CM

## 2021-12-11 LAB — URINALYSIS DIPSTICK REFLEX TO CULTURE
LEUKOCYTES: NEGATIVE
NITRITE: NEGATIVE
PROTEIN,UA: NEGATIVE mL
URINE BILE: NEGATIVE
URINE BLOOD: NEGATIVE
URINE KETONE: NEGATIVE mL
URINE PH: 6.5 mL (ref 5.0–7.0)
URINE SPEC GRAVITY: 1
UROBILINOGEN: 0.2

## 2021-12-11 LAB — URINALYSIS MICROSCOPIC REFLEX TO CULTURE

## 2021-12-11 LAB — PROTEIN/CR RATIO,UR RAN
PROT CREAT RAT/CAL: 0.1 mg/mg (ref <0.2)
UR CREATININE, RAN: 62 mg/dL (ref 0–2)
UR TOTAL PROTEIN,RAN: 7.5 mg/dL (ref 1–14)

## 2021-12-16 ENCOUNTER — Encounter: Admit: 2021-12-16 | Discharge: 2021-12-16 | Payer: Medicare Other

## 2021-12-16 DIAGNOSIS — Z940 Kidney transplant status: Secondary | ICD-10-CM

## 2021-12-16 DIAGNOSIS — D849 Immunodeficiency, unspecified: Secondary | ICD-10-CM

## 2021-12-17 ENCOUNTER — Encounter: Admit: 2021-12-17 | Discharge: 2021-12-17 | Payer: Medicare Other

## 2021-12-17 DIAGNOSIS — D849 Immunodeficiency, unspecified: Secondary | ICD-10-CM

## 2021-12-17 DIAGNOSIS — Z940 Kidney transplant status: Secondary | ICD-10-CM

## 2021-12-17 LAB — BK VIRUS DNA, QUANT PLASMA

## 2021-12-17 LAB — TACROLIMUS LC-MS/MS: TACROLIMUS LC-MS/MS: 7.7 mg/dL (ref 7–25)

## 2021-12-18 ENCOUNTER — Encounter: Admit: 2021-12-18 | Discharge: 2021-12-18 | Payer: Medicare Other

## 2021-12-18 DIAGNOSIS — D849 Immunodeficiency, unspecified: Secondary | ICD-10-CM

## 2021-12-18 DIAGNOSIS — Z940 Kidney transplant status: Secondary | ICD-10-CM

## 2021-12-19 ENCOUNTER — Encounter: Admit: 2021-12-19 | Discharge: 2021-12-19 | Payer: Medicare Other

## 2021-12-19 DIAGNOSIS — D849 Immunodeficiency, unspecified: Secondary | ICD-10-CM

## 2021-12-19 DIAGNOSIS — Z940 Kidney transplant status: Secondary | ICD-10-CM

## 2021-12-19 LAB — CMV QUANT PCR-BLOOD: CMV DNA QN BY PCR: 2.8 — ABNORMAL HIGH

## 2021-12-20 MED FILL — MYCOPHENOLATE SODIUM 180 MG PO TBEC: 180 mg | ORAL | 90 days supply | Qty: 90 | Fill #1 | Status: AC

## 2021-12-21 ENCOUNTER — Encounter: Admit: 2021-12-21 | Discharge: 2021-12-21 | Payer: Medicare Other

## 2021-12-21 NOTE — Progress Notes
41yo female who is s/p renal transplant on 05/07/21 is in the ED at [facility] with hyperglycemia.

## 2021-12-25 ENCOUNTER — Encounter: Admit: 2021-12-25 | Discharge: 2021-12-25 | Payer: Medicare Other

## 2021-12-25 DIAGNOSIS — D849 Immunodeficiency, unspecified: Secondary | ICD-10-CM

## 2021-12-25 DIAGNOSIS — Z940 Kidney transplant status: Secondary | ICD-10-CM

## 2021-12-25 DIAGNOSIS — Z79899 Other long term (current) drug therapy: Secondary | ICD-10-CM

## 2021-12-25 NOTE — Telephone Encounter
Brandy Joseph called regarding expired lab orders. Pt is currently trying to get labs. Fax [phone]    [name] stated that she would fax orders over

## 2021-12-29 ENCOUNTER — Encounter: Admit: 2021-12-29 | Discharge: 2021-12-29 | Payer: Medicare Other

## 2021-12-29 DIAGNOSIS — Z79899 Other long term (current) drug therapy: Secondary | ICD-10-CM

## 2021-12-29 DIAGNOSIS — D849 Immunodeficiency, unspecified: Secondary | ICD-10-CM

## 2021-12-29 DIAGNOSIS — Z940 Kidney transplant status: Secondary | ICD-10-CM

## 2021-12-29 LAB — URIC ACID: URIC ACID: 5.1 (ref 2.6–6.0)

## 2021-12-29 LAB — COMPREHENSIVE METABOLIC PANEL
ALBUMIN: 3.9 (ref 3.5–5.0)
ALT: 21 U/L (ref 0–55)
ANION GAP: 9 mg/dL (ref 47–110)
CALCIUM: 9.5 mg/dL (ref 8.4–10.2)
CO2: 22 mmol/L (ref 22–29)
POTASSIUM: 4.4 mmol/L (ref 3.5–5.1)
SODIUM: 138 mmol/L (ref 136–145)
TOTAL BILIRUBIN: 0.3 mg/dL (ref 0.2–1.2)
TOTAL PROTEIN: 7.4 g/dL (ref 6.4–8.3)

## 2021-12-29 LAB — CBC AND DIFF
ABSOLUTE BASO COUNT: 0 /uL (ref 0.01–0.08)
ABSOLUTE EOS COUNT: 0 /uL (ref 0.04–0.54)
ABSOLUTE LYMPH COUNT: 2.7 /uL (ref 1.18–3.174)
ABSOLUTE MONO COUNT: 0.6 /uL (ref 0.24–0.86)
ABSOLUTE NEUTROPHIL: 2.1 /uL (ref 1.56–6.13)
BASOPHILS: 0.7 % (ref 0.1–1.2)
EOSINOPHIL %: 1.6 % (ref 0.7–7.0)
HEMATOCRIT: 37 % (ref 34.1–44.9)
HEMOGLOBIN: 12 g/dL (ref 11.2–15.7)
LYMPHOCYTES: 48 % (ref 19.3–53.1)
MCH: 29 pg (ref 25.6–32.2)
MCHC: 31 % — ABNORMAL LOW (ref 32.2–35.5)
MCV: 91
MONOCYTES %: 11 % (ref 4.7–12.5)
MPV: 9.7
NEUTROPHILS %: 37 % (ref 34.0–71.1)
PLATELET COUNT: 274 /uL (ref 182–369)
RBC COUNT: 4.1 uL (ref 3.93–5.22)
RDW: 12
WBC COUNT: 5.6 uL (ref 3.98–10.04)

## 2021-12-29 LAB — URINALYSIS DIPSTICK REFLEX TO CULTURE
LEUKOCYTES: NEGATIVE U/L (ref 40–150)
URINE BILE: NEGATIVE U/L (ref 5–34)
URINE KETONE: NEGATIVE mL/Min/1.73

## 2021-12-29 LAB — PHOSPHORUS: PHOSPHORUS: 3.4 mg/dL

## 2021-12-29 LAB — MAGNESIUM: MAGNESIUM: 1.7 mg/dL

## 2021-12-29 LAB — PROTEIN/CR RATIO,UR RAN
PROT CREAT RAT/CAL: 0.1 mL/Min/1.73 (ref <0.2)
UR TOTAL PROTEIN,RAN: 9.9 mg/dL — ABNORMAL HIGH (ref 1–14)

## 2021-12-29 NOTE — Progress Notes
12/10/2021: DSA **repeat in august kit shipped   Beads I Positive: No donor specific antibody is present  Beads II Negative: No donor specific antibody is present     Upkeep labs 07/30/2021  Next scheduled upkeep labs  01/2022    Seeing Dr. Monia Pouch for endocrine   CGM     BP does not check regularly 120s/70s

## 2021-12-30 ENCOUNTER — Encounter: Admit: 2021-12-30 | Discharge: 2021-12-30 | Payer: Medicare Other

## 2021-12-30 ENCOUNTER — Ambulatory Visit: Admit: 2021-12-30 | Discharge: 2021-12-31 | Payer: Medicare Other

## 2021-12-30 DIAGNOSIS — E119 Type 2 diabetes mellitus without complications: Secondary | ICD-10-CM

## 2021-12-30 DIAGNOSIS — R413 Other amnesia: Secondary | ICD-10-CM

## 2021-12-30 DIAGNOSIS — K3184 Gastroparesis: Secondary | ICD-10-CM

## 2021-12-30 DIAGNOSIS — B348 Other viral infections of unspecified site: Secondary | ICD-10-CM

## 2021-12-30 DIAGNOSIS — N289 Disorder of kidney and ureter, unspecified: Secondary | ICD-10-CM

## 2021-12-30 DIAGNOSIS — I10 Essential (primary) hypertension: Secondary | ICD-10-CM

## 2021-12-30 DIAGNOSIS — I639 Cerebral infarction, unspecified: Secondary | ICD-10-CM

## 2021-12-30 DIAGNOSIS — D849 Immunodeficiency, unspecified: Secondary | ICD-10-CM

## 2021-12-30 NOTE — Progress Notes
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  Donor: DCD, IRD: Yes, Age: 48s, female, Ht 5'11. Wt 215 lbs. Creatinine Initial/Terminal. ?/1.8  KDPI: 47%, cPRA:? 75%  Match: VXM Negative, PXM -ve  CMV: D+/R+  Induction: thymoglobulin  Maintenance Immunosuppression: Triple drug therapy  Post OP: good UOP post op    Referring Nephrologist:  Lorre Munroe  [address] [place] [address]  Phone: [phone]  Fax: [phone]     Dear Dr. Lorre Munroe,    We had the pleasure of meeting Brandy Joseph in the [facility] for routine evaluation of her renal transplant.the only complaint currently is back pain.  She has been undergoing PD.  Also reports that her walker was too low and now she has gotten up taller walker.  Urination is well, denies dysuria, hematuria.  Denies chest pain, shortness of breath, recent illnesses or surgeries.  Compliant with medications, blood pressure has been running normal.  Blood sugars have been within normal limits, continues to follow with endocrine.    REVIEW OF SYSTEMS: Comprehensive 14-point ROS reviewed  Positives noted in HPI otherwise negative.    Past History:  Medical History:   Diagnosis Date   ? 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 DM (diabetes mellitus) (HCC)    ? Gastroparesis    ? Hypertension    ? Kidney disease    ? Memory loss    ? Stroke [facility])        Surgical History:   Procedure Laterality Date   ? ANKLE SURGERY Left 1996   ? FIBULA FRACTURE SURGERY Left 1996   ? ESOPHAGOGASTRODUODENOSCOPY WITH BIOPSY - FLEXIBLE N/A 03/29/2018    Performed by Brandy Organ, MD at [facility] ENDO   ? ESOPHAGOGASTRODUODENOSCOPY WITH BIOPSY - FLEXIBLE N/A 10/04/2019    Performed by Brandy All, MD at [facility] ENDO   ? COLONOSCOPY WITH BIOPSY - FLEXIBLE N/A 10/04/2019    Performed by Brandy All, MD at [facility] ENDO   ? ANGIOGRAPHY CORONARY ARTERY WITH LEFT HEART CATHETERIZATION N/A 01/18/2020    Performed by Brandy Mosher, MD at [facility] CATH LAB   ? POSSIBLE PERCUTANEOUS CORONARY STENT PLACEMENT WITH ANGIOPLASTY N/A 01/18/2020    Performed by Brandy Mosher, MD at [facility] CATH LAB   ? COLONOSCOPY WITH BIOPSY - FLEXIBLE N/A 05/15/2020    Performed by Brandy Nine, MD at [facility] ENDO   ? COLONOSCOPY WITH SNARE REMOVAL TUMOR/ POLYP/ OTHER LESION  05/15/2020    Performed by Brandy Nine, MD at [facility] ENDO   ? ALLOTRANSPLANTATION KIDNEY FROM NON LIVING DONOR WITHOUT RECIPIENT NEPHRECTOMY N/A 05/07/2021    Performed by Brandy Johns, MD at [facility] OR   ? BURR HOLE      release pressure from stroke   ? DIALYSIS FISTULA CREATION     ? GALLBLADDER SURGERY     ? HX CESAREAN SECTION         Social History     Socioeconomic History   ? Marital status: Divorced   ? Number of children: 1   ? Highest education level: Associate degree: occupational, Scientist, product/process development, or vocational program   Tobacco Use   ? Smoking status: Never   ? Smokeless tobacco: Never   Vaping Use   ?  Vaping Use: Never used   Substance and Sexual Activity   ? Alcohol use: Yes     Comment: occasional glass of wine   ? Drug use: Never       Family History   Problem Relation Age of Onset   ? Stroke Mother    ? Diabetes Paternal Grandmother    ? Heart problem Paternal Grandmother    ? Hypertension Paternal Grandmother    ? Tumor Maternal Grandmother         brain   ? None Reported Son        Allergies   Allergen Reactions   ? Ketorolac RASH   ? Sulfa (Sulfonamide Antibiotics) EDEMA     Full body swelling; eye swelling; itchiness       Current Medications:    Current Outpatient Medications:   ?  aspirin EC (ASPIR-LOW) 81 mg tablet, Take one tablet by mouth daily. Take with food., Disp: 90 tablet, Rfl:   ?  biotin 5 mg tablet, Take one tablet by mouth daily., Disp: 90 tablet, Rfl: 3  ?  buPROPion XL (WELLBUTRIN XL) 150 mg tablet, Take one tablet by mouth every morning. Do not crush or chew., Disp: , Rfl:   ?  carvediloL (COREG) 25 mg tablet, TAKE 1 AND 1/2 TABLETS BY MOUTH TWICE DAILY WITH FOOD, Disp: 270 tablet, Rfl: 3  ?  cetirizine (ZYRTEC) 10 mg tablet, Take one tablet by mouth daily., Disp: , Rfl:   ?  CHOLEcalciferoL (vitamin D3) (VITAMIN D3) 50 mcg (2,000 unit) tablet, Take one tablet by mouth daily., Disp: 90 tablet, Rfl: 3  ?  docusate (COLACE) 100 mg capsule, Take one capsule by mouth twice daily., Disp: 180 capsule, Rfl: 1  ?  ferrous sulfate (FEROSUL) 325 mg (65 mg iron) tablet, Take one tablet by mouth daily., Disp: 90 tablet, Rfl: 3  ?  insulin lispro(+) (HUMALOG U-100 INSULIN) 100 unit/mL injection, Maximum of 50 units daily. Via insulin pump. Use 1:6 insulin to carb ratio and the following correction factor Glucose 140-180 = 1 units Glucose 181-220 = 2 units Glucose 221-260 = 3 units Glucose 261-300 = 4 units Glucose 301-350 = 5 units Glucose 351-400 = 6 units Glucose >400 = 7 units, Disp: 3 vial, Rfl: 3  ?  insulin pen needles (disposable) (BD UF NANO PEN NEEDLES) 32 gauge x 5/32 pen needle, Use one each as directed as Needed. Use with insulin injections., Disp: 300 each, Rfl: 0  ?  Insulin Syringe-Needle U-100 (BD INSULIN SYRINGE ULTRA-FINE) 1 mL 31 gauge x 5/16 syrg, Use with insulin vials as directed, Disp: 100 each, Rfl: 1  ?  lisinopril (ZESTRIL) 5 mg tablet, Take one tablet by mouth at bedtime daily. Indications: high blood pressure, Disp: 30 tablet, Rfl: 2  ?  magnesium oxide (MAGOX) 400 mg (241.3 mg magnesium) tablet, Take two tablets by mouth daily., Disp: 30 tablet, Rfl: 5  ?  mycophenolate sodium (MYFORTIC) 180 mg tablet,delayed release, Take one tablet by mouth daily., Disp: 90 tablet, Rfl: 3  ?  pantoprazole DR (PROTONIX) 40 mg tablet, Take one tablet by mouth daily., Disp: 30 tablet, Rfl: 1  ?  predniSONE (DELTASONE) 5 mg tablet, Take one tablet by mouth daily., Disp: 90 tablet, Rfl: 3  ?  rosuvastatin (CRESTOR) 20 mg tablet, Take one-half tablet by mouth daily., Disp: 15 tablet, Rfl: 1  ?  tacrolimus (PROGRAF) 1 mg capsule, Take two capsules by mouth twice daily., Disp: 360 capsule, Rfl: 3  ?  tamsulosin (FLOMAX) 0.4 mg capsule, Take one capsule by mouth daily after breakfast. Do not crush, chew or open capsules. Take 30 minutes following the same meal each day., Disp: 30 capsule, Rfl: 1    Physical Exam:  There were no vitals filed for this visit.There is no height or weight on file to calculate BMI.  General: NAD, A+Ox4, calm and pleasant. Appears to be stated age  Laboratory studies:     CMP:  CMP Latest Ref Rng & Units 12/27/2021 12/10/2021 11/26/2021 11/13/2021 10/29/2021   NA 136 - 145 mmol/L 138 136 139 140 140   K 3.5 - 5.1 mmol/L 4.4 4.2 4.8 4.0 4.6   CL mmol/L 107 105 110(H) 108(H) 105   CO2 22 - 29 mmol/L 22.0 23.0 19.0(L) 22.0 26   GAP - 9 8 10 10 9    BUN 7.0 - 18.7 mg/dL 21.0(H) 18.0 25.0(H) 22.0(H) 20   CR mL/Min/1.73 1.08 1.07 1.02 1.02 0.98   GLUX 70 - 100 MG/DL - - - - 454(U)   CA 8.4 - 10.2 mg/dL 9.5 9.5 9.6 9.7 9.8   TP 6.4 - 8.3 g/dL 7.4 6.7 7.3 7.7 7.4   ALB 3.5 - 5.0 3.9 3.9 4.2 4.1 4.5   ALKP 40 - 150 U/L 60 58 59 60 48   ALT 0 - 55 U/L 21 20 28 17 19    TBILI 0.2 - 1.2 mg/dL 9.81 1.91 4.78 2.95 0.5   GFR >59 mL/Min/1.73 - - 63.2 - -   GFRAA >60 mL/min - - - - -     Hemoglobin A1C (%)   Date Value   07/30/2021 8.3 (H)   05/07/2021 7.4 (H)   12/26/2020 10.5 (H)     PTH Hormone (PG/ML)   Date Value   07/30/2021 54.6   05/07/2021 396.7 (H)     Lipase   Date Value Ref Range Status   05/16/2021 5 (L) 11 - 82 U/L Final   10/01/2019 31 11 - 82 U/L Final     No results found for: AMY  BK Virus Plasma Quant (no units)   Date Value   11/13/2021 Not Detected   10/29/2021     NOT DETECTED  Reference range: NOT DETECTED  Unit: IU/mL  TESTING PERFORMED AT LOW VOLUME ON PLASMA SPECIMEN FOR BKV qPCR, MAY  AFFECT  RESULTS.  Marland Kitchen  Assay Range: 33 IU/mL to 3.30E+08 IU/mL  .  One IU is equal to 0.33 copies of BKV.  .  The limit of quantitation (LOQ) is 33 IU/mL. BK virus DNA detected  below the LOQ  will be reported as Detected:<33 IU/mL.  .  This test was developed and its performance characteristics  determined by  [facility]. It has not been cleared or approved by the U.S.  Food and Drug  Administration. Results should be used in conjunction with clinical  findings,  and should not form the sole basis for a diagnosis or treatment  decision.  ____________________________________________________________  Testing Performed At:  [facility]  [address]  [place], [place] [address]  Laboratory Director: Lind Guest Ph.D., BCLD (ABB)  CLIA#: [id number]  Phone: [phone]     10/01/2021 160 (H)   09/17/2021 <500 (A)   09/03/2021 Detected (A)     CMV DNA Quant PCR   Date Value   12/10/2021 2.85 (H)   11/26/2021 NOT DETECTED   11/13/2021 NOT DETECTED   10/29/2021 CMV DNA NOT DETECTED [IU]/mL   10/16/2021 NOT DETECTED  IU/mL CMV Blood (no units)   Date Value   10/29/2021     <50 IU/mL  The test method detects and quantitates CMV DNA using the Abbott RealTime assay,   and is approved by the FDA for monitoring hematopoietic stem cell transplant   patients who are undergoing anti-CMV therapy.  Please correlate results with the   clinical status of the patient.     10/01/2021     <50 IU/mL  The test method detects and quantitates CMV DNA using the Abbott RealTime assay,   and is approved by the FDA for monitoring hematopoietic stem cell transplant   patients who are undergoing anti-CMV therapy.  Please correlate results with the   clinical status of the patient.     08/05/2021     <50 IU/mL  The test method detects and quantitates CMV DNA using the Abbott RealTime assay,   and is approved by the FDA for monitoring hematopoietic stem cell transplant   patients who are undergoing anti-CMV therapy.  Please correlate results with the   clinical status of the patient.     07/30/2021     <50 IU/mL  The test method detects and quantitates CMV DNA using the Abbott RealTime assay,   and is approved by the FDA for monitoring hematopoietic stem cell transplant   patients who are undergoing anti-CMV therapy.  Please correlate results with the   clinical status of the patient.     07/09/2021     <50 IU/mL  The test method detects and quantitates CMV DNA using the Abbott RealTime assay,   and is approved by the FDA for monitoring hematopoietic stem cell transplant   patients who are undergoing anti-CMV therapy.  Please correlate results with the   clinical status of the patient.       BK Virus Plasma Copies/ML (no units)   Date Value   12/10/2021 NOT DETECTED   10/16/2021 NOT DETECTED   07/02/2021 <500 (A)     No results found for: EBVDNAQT    TACROLIMUS LEVEL:  Tacrolimus Immunoassay (ng/mL)   Date Value   10/29/2021 12.8   10/01/2021 11.2   08/06/2021 14.2   07/30/2021 8.4       CBC with Diff:  CBC with Diff Latest Ref Rng & Units 12/27/2021 12/10/2021   WBC 3.98 - 10.04 uL 5.65 5.62   RBC 3.93 - 5.22 uL 4.13 3.81(L)   HGB 11.2 - 15.7 g/dL 66.4 40.3   HCT 47.4 - 44.9 % 37.9 35.9   MCV - 91.8 94.2   MCH 25.6 - 32.2 pg 29.1 29.7   MCHC 32.2 - 35.5 % 31.71(L) 31.5(L)   RDW - 12.5 12.1   PLT 182 - 369 /uL 274 291   MPV - 9.7 9.3(L)   NEUT 34.0 - 71.1 % 37.6 73.3(H)   ANC 1.56 - 6.13 /uL 2.13 4.12   LYMA 24 - 44 % - -   ALYM 1.18 - 3.174 /uL 2.71 0.87(L)   MONA 4 - 12 % - -   AMONO 0.24 - 0.86 /uL 0.66 0.50   EOSA 0 - 5 % - -   AEOS 0.04 - 0.54 /uL 0.09 0.08   BASA 0 - 2 % - -   ABAS 0.01 - 0.08 /uL 0.04 <0.03     Lab Results   Component Value Date/Time    IRON 142 07/30/2021 09:58 AM    TIBC 335 07/30/2021 09:58 AM    PSAT 42 07/30/2021  09:58 AM    FERRITIN 352 (H) 07/30/2021 09:58 AM    FERRITIN 1,156 (H) 05/09/2021 05:36 AM       Urinalysis:  Lab Results   Component Value Date/Time    UCOLOR Yellow 12/27/2021 10:21 AM    TURBID Clear 12/27/2021 10:21 AM    USPGR 1.010 12/27/2021 10:21 AM    UPH 7.0 12/27/2021 10:21 AM    UPROTEIN Negative 12/27/2021 10:21 AM    UAGLU Negative 12/27/2021 10:21 AM    UKET Negative 12/27/2021 10:21 AM    UBILE Negative 12/27/2021 10:21 AM    UBLD Negative 12/27/2021 10:21 AM    UROB 0.2 12/27/2021 10:21 AM     Protein/CR ratio   Date Value   12/27/2021 0.10599   12/10/2021 0.12019 mg/mg   11/13/2021 0.15000   10/29/2021 0.1   10/16/2021 0.10432   10/16/2021 1.61096       Imaging:  Results for orders placed during the hospital encounter of 08/05/21    CTA CHEST PULM EMBOLISM W/CONT    Impression  1.  Limited opacification of subsegmental pulmonary arteries without imaging features suggestive of acute pulmonary embolism. No evidence of right heart strain.  2.  Several unchanged scattered bilateral pulmonary nodules, likely scarring/granulomas. Dedicated follow-up is not indicated in the absence of known malignancy.  3.  Nonobstructing left upper pole renal calculus.    By my electronic signature, I attest that I have personally reviewed the images for this examination and formulated the interpretations and opinions expressed in this report      Finalized by Dimple Casey, M.D. on 08/05/2021 8:41 PM. Dictated by Dion Body, M.D. on 08/05/2021 7:58 PM.    Results for orders placed during the hospital encounter of 08/05/21    ABDOMEN 1 VIEW    Impression  Findings/Impression:    2 supine frontal x-ray images of the abdomen/pelvis were obtained. No dilated bowel loops are visualized. Prior cholecystectomy. Mass-effect in the right pelvis region is present due to the known right pelvis renal transplant. There are several tiny foci of calcification projected over the pelvis that most likely represent phleboliths. Calcific arteriosclerosis.      Finalized by Rosilyn Mings, M.D. on 08/05/2021 10:44 AM. Dictated by Rosilyn Mings, M.D. on 08/05/2021 10:42 AM.    Assessment and Plan:  Kalijah Jarecki is a 42 y.o. 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   ?  #) Immunosuppression - On maintenance Triple drug therapy  - Prograf to goal 10 by MEIA, MPA 180mg  daily - now CMV and previous BK and steroid pulse to wean to prednisone 5 mg daily  12/10/2021: DSA **repeat in august kit shipped   Beads I Positive: No donor specific antibody is present  Beads II Negative: No donor specific antibody is present?  ?  #) Deceased donor renal transplant:pre Tx UPCR bl 1.3 gm/gm)   - Cr ~1  ?  #) ID Proph  - CMV Donor(+)/ Recipient(+) to treat with Valcyte for 3 months post transplant, CMV detected- decreased MPA to 180mg  daily   - Keflex for urinary prophylaxis until stent, pentamidine for PJP. Dose rec'd today   - the patient has Hx of recurrent UTI and Hx of neurogenic bladder.  ?  #) BPs:   - WNL   ?  #) Diabetes  - BGs have been labile on BMPs  - on insulin pump  - HgA1C 8.3% today  - follows with Endocrinology  ?  #)  Hx of CVA   - with residual memory loss and RLE weakness.  - start baby ASA/ cont Rosuvastatin  ?  #) Anemia - Hgb average of 10-12s  - at goal, monitor   - Iron studies: Iron sufficient on 05/09/2021  ?  #) CKD-MBD  - PTH?396 on 05/07/2021  - Vitamin D?16.5 on 11/2 - Completed 25-Vit.D 50 000 unit weekly. Start 2000 international unit(s) once daily  - Ca acceptable  - PO4 acceptable- d/c phos NaK    #) Memory issues  - unable to remember meds, timings etc  - uses phone alarm    #) Anxiety/depression  - Cont Wellbutrin XL    #) General health maint. Managed by PCP   Skin cancer surveillance: Annual follow up with Dermatology for skin eval due to long term immunosuppression   Cancer screening: Mammogram, Pap Smear, colonoscopy per guidelines   Annual high dose flu vaccine    Avoid Joseph live vaccines. When clinically indicated or age appropriate, can receive the Shingrix vaccine one year post transplant.      Vaccinated for COVID-19 [Moderna] x 4    RTC in 3 m   Labs 2 weekly     Sincerely,    Corena Herter, MD  Transplant Nephrology    Cc: Thomas Alderson  Cc: Christene Lye Darland    Please contact the [facility] at [phone] for any transplant related questions or concerns that may arise.

## 2021-12-30 NOTE — Progress Notes
The patient  reviewed the three documents sent via MyChart, agrees with the information and provided verbal consent for this visit.

## 2021-12-31 ENCOUNTER — Encounter: Admit: 2021-12-31 | Discharge: 2021-12-31 | Payer: Medicare Other

## 2021-12-31 DIAGNOSIS — Z79899 Other long term (current) drug therapy: Secondary | ICD-10-CM

## 2021-12-31 DIAGNOSIS — Z940 Kidney transplant status: Secondary | ICD-10-CM

## 2021-12-31 DIAGNOSIS — D849 Immunodeficiency, unspecified: Secondary | ICD-10-CM

## 2021-12-31 LAB — CMV QUANT PCR-BLOOD: CMV DNA QN BY PCR: 281 — ABNORMAL HIGH

## 2022-01-01 ENCOUNTER — Encounter: Admit: 2022-01-01 | Discharge: 2022-01-01 | Payer: MEDICARE

## 2022-01-01 DIAGNOSIS — D849 Immunodeficiency, unspecified: Secondary | ICD-10-CM

## 2022-01-01 DIAGNOSIS — Z79899 Other long term (current) drug therapy: Secondary | ICD-10-CM

## 2022-01-01 DIAGNOSIS — Z940 Kidney transplant status: Secondary | ICD-10-CM

## 2022-01-01 LAB — BK VIRUS DNA, QUANT PLASMA

## 2022-01-01 LAB — TACROLIMUS LC-MS/MS: TACROLIMUS LC-MS/MS: 7.5 ug/L (ref 5.0–20.0)

## 2022-01-02 ENCOUNTER — Encounter: Admit: 2022-01-02 | Discharge: 2022-01-02 | Payer: MEDICARE

## 2022-01-02 MED FILL — VALGANCICLOVIR 450 MG PO TAB: 450 mg | ORAL | 30 days supply | Qty: 60 | Fill #1 | Status: AC

## 2022-01-02 NOTE — Telephone Encounter
Tess pt aunt is on ph in regards to Virus and anitbiotics , xfrd cal to [place]

## 2022-01-08 ENCOUNTER — Encounter: Admit: 2022-01-08 | Discharge: 2022-01-08 | Payer: MEDICARE

## 2022-01-08 DIAGNOSIS — D849 Immunodeficiency, unspecified: Secondary | ICD-10-CM

## 2022-01-08 DIAGNOSIS — Z79899 Other long term (current) drug therapy: Secondary | ICD-10-CM

## 2022-01-08 DIAGNOSIS — Z940 Kidney transplant status: Secondary | ICD-10-CM

## 2022-01-08 LAB — URINALYSIS DIPSTICK REFLEX TO CULTURE
LEUKOCYTES: NEGATIVE
NITRITE: NEGATIVE
PROTEIN,UA: NEGATIVE
URINE BILE: NEGATIVE
URINE KETONE: NEGATIVE
URINE PH: 5.5
UROBILINOGEN: 0.2

## 2022-01-08 LAB — CBC AND DIFF
ABSOLUTE EOS COUNT: 0
ABSOLUTE LYMPH COUNT: 2.7
ABSOLUTE NEUTROPHIL: 2.6
BASOPHILS: 0.7
MCH: 29
WBC COUNT: 5.9

## 2022-01-08 LAB — URINALYSIS MICROSCOPIC REFLEX TO CULTURE

## 2022-01-13 ENCOUNTER — Encounter: Admit: 2022-01-13 | Discharge: 2022-01-13 | Payer: MEDICARE

## 2022-01-13 DIAGNOSIS — Z940 Kidney transplant status: Secondary | ICD-10-CM

## 2022-01-13 DIAGNOSIS — D849 Immunodeficiency, unspecified: Secondary | ICD-10-CM

## 2022-01-13 DIAGNOSIS — Z79899 Other long term (current) drug therapy: Secondary | ICD-10-CM

## 2022-01-13 LAB — COMPREHENSIVE METABOLIC PANEL
ALK PHOSPHATASE: 64 U/L — ABNORMAL LOW (ref 40–150)
ALT: 20 U/L — ABNORMAL LOW (ref 0–55)
ANION GAP: 7 meq/L — ABNORMAL LOW (ref 8–16)
BLD UREA NITROGEN: 25 mg/dL — ABNORMAL HIGH (ref 7.0–18.7)
CALCIUM: 9.4 mg/dL (ref 8.4–10.2)
CHLORIDE: 107 mmol/L (ref 98–107)
CO2: 23 mmol/L (ref 22–29)
CREATININE: 1.1 mg/dL (ref 0.57–1.11)
GFR ESTIMATED: 57 mL/Min/1.73 — ABNORMAL LOW (ref >59)
GLUCOSE,RANDOM: 194 mg/dL — ABNORMAL HIGH (ref 70–105)
POTASSIUM: 4.5 mmol/L (ref 3.5–5.1)
SODIUM: 137 mmol/L (ref 136–145)

## 2022-01-13 LAB — PHOSPHORUS: PHOSPHORUS: 3.7 mg/dL (ref 2.3–4.7)

## 2022-01-13 LAB — TACROLIMUS LC-MS/MS: TACROLIMUS LC-MS/MS: 5.7 ug/L (ref 5.0–20.0)

## 2022-01-13 LAB — PROTEIN/CR RATIO,UR RAN
PROT CREAT RAT/CAL: 0 mg/mg (ref <0.2)
UR CREATININE, RAN: 91 mg/dL (ref 47–110)
UR TOTAL PROTEIN,RAN: 8.9 mg/dL (ref 1–14)

## 2022-01-13 LAB — URIC ACID: URIC ACID: 4.4 mg/dL (ref 2.6–6.0)

## 2022-01-13 LAB — MAGNESIUM: MAGNESIUM: 1.6 mg/dL (ref 1.6–2.6)

## 2022-01-13 LAB — BK VIRUS DNA, QUANT PLASMA

## 2022-01-14 ENCOUNTER — Encounter: Admit: 2022-01-14 | Discharge: 2022-01-14 | Payer: MEDICARE

## 2022-01-15 ENCOUNTER — Encounter: Admit: 2022-01-15 | Discharge: 2022-01-15 | Payer: MEDICARE

## 2022-01-19 ENCOUNTER — Encounter: Admit: 2022-01-19 | Discharge: 2022-01-19 | Payer: MEDICARE

## 2022-01-19 NOTE — Telephone Encounter
Incoming TC from patient. She has noticed blood on her bedsheets a few mornings in a row and notes that she is not on her period. She was seen in [place] ED on 7/13 and prescribed abx for UTI. She has not picked up these meds yet. Advised to please start medication immediately and send mychart with name of med and frequency/duration.     Routed this message to Dr. Lyndel Safe for review

## 2022-01-19 NOTE — Telephone Encounter
Pt called has some concerns of spotting on her bed sheets. She states its not her period.     Transferred to [employer]

## 2022-01-22 ENCOUNTER — Encounter: Admit: 2022-01-22 | Discharge: 2022-01-22 | Payer: MEDICARE

## 2022-01-22 DIAGNOSIS — E1010 Type 1 diabetes mellitus with ketoacidosis without coma: Secondary | ICD-10-CM

## 2022-01-22 DIAGNOSIS — Z940 Kidney transplant status: Secondary | ICD-10-CM

## 2022-01-22 DIAGNOSIS — Z79899 Other long term (current) drug therapy: Secondary | ICD-10-CM

## 2022-01-22 DIAGNOSIS — I2510 Atherosclerotic heart disease of native coronary artery without angina pectoris: Secondary | ICD-10-CM

## 2022-01-22 DIAGNOSIS — D849 Immunodeficiency, unspecified: Secondary | ICD-10-CM

## 2022-01-23 ENCOUNTER — Encounter: Admit: 2022-01-23 | Discharge: 2022-01-23 | Payer: MEDICARE

## 2022-01-23 DIAGNOSIS — D849 Immunodeficiency, unspecified: Secondary | ICD-10-CM

## 2022-01-23 DIAGNOSIS — Z79899 Other long term (current) drug therapy: Secondary | ICD-10-CM

## 2022-01-23 DIAGNOSIS — Z940 Kidney transplant status: Secondary | ICD-10-CM

## 2022-01-23 DIAGNOSIS — E1010 Type 1 diabetes mellitus with ketoacidosis without coma: Secondary | ICD-10-CM

## 2022-01-23 DIAGNOSIS — I2510 Atherosclerotic heart disease of native coronary artery without angina pectoris: Secondary | ICD-10-CM

## 2022-01-23 LAB — LIPID PROFILE
CHOLESTEROL/HDL %: 3
CHOLESTEROL: 163 — ABNORMAL HIGH (ref 4.63–204.00)
HDL: 56
TRIGLYCERIDES: 69 — ABNORMAL LOW (ref 250–450)
VLDL: 14

## 2022-01-23 LAB — HEMOGLOBIN A1C: HEMOGLOBIN A1C: 7.9

## 2022-01-23 LAB — IRON + BINDING CAPACITY + %SAT+ FERRITIN: IRON: 110

## 2022-01-26 ENCOUNTER — Encounter: Admit: 2022-01-26 | Discharge: 2022-01-26 | Payer: MEDICARE

## 2022-01-26 DIAGNOSIS — Z940 Kidney transplant status: Secondary | ICD-10-CM

## 2022-01-26 DIAGNOSIS — D849 Immunodeficiency, unspecified: Secondary | ICD-10-CM

## 2022-01-26 DIAGNOSIS — Z79899 Other long term (current) drug therapy: Secondary | ICD-10-CM

## 2022-01-26 LAB — CMV QUANT PCR-BLOOD

## 2022-01-27 ENCOUNTER — Encounter: Admit: 2022-01-27 | Discharge: 2022-01-27 | Payer: MEDICARE

## 2022-01-28 ENCOUNTER — Encounter: Admit: 2022-01-28 | Discharge: 2022-01-28 | Payer: MEDICARE

## 2022-01-28 DIAGNOSIS — Z940 Kidney transplant status: Secondary | ICD-10-CM

## 2022-01-28 DIAGNOSIS — D849 Immunodeficiency, unspecified: Secondary | ICD-10-CM

## 2022-01-28 DIAGNOSIS — Z79899 Other long term (current) drug therapy: Secondary | ICD-10-CM

## 2022-01-28 NOTE — Telephone Encounter
Pt called in to ask if she is able to see a chiropractor. Please return Pt's call to follow up.

## 2022-01-29 ENCOUNTER — Encounter: Admit: 2022-01-29 | Discharge: 2022-01-29 | Payer: MEDICARE

## 2022-01-29 MED FILL — VALGANCICLOVIR 450 MG PO TAB: 450 mg | ORAL | 30 days supply | Qty: 60 | Fill #2 | Status: AC

## 2022-01-30 ENCOUNTER — Encounter: Admit: 2022-01-30 | Discharge: 2022-01-30 | Payer: MEDICARE

## 2022-01-30 DIAGNOSIS — D849 Immunodeficiency, unspecified: Secondary | ICD-10-CM

## 2022-01-30 DIAGNOSIS — Z79899 Other long term (current) drug therapy: Secondary | ICD-10-CM

## 2022-01-30 DIAGNOSIS — Z940 Kidney transplant status: Secondary | ICD-10-CM

## 2022-01-30 NOTE — Telephone Encounter
TC to patient after receiving message. PT saw patient today and did massage for lymphatic drainage. Sharp, constant pain to kidney transplant site 6-7/10. Patient took two tylenol and still reporting 6-7/10 pain. UO ok. Denies fever, N/V/D. Patient reports she is on menstrual cycle and having severe cramping.     Reviewed with Dr. Lyndel Safe and received the following orders:    Transplant [place] today  Labs today (patient already took meds) no tac level ordered   Orders sent to [facility] and scheduled for 1330

## 2022-01-30 NOTE — Telephone Encounter
Pt is having pain in her kidney area , transplanted 268 days ago,  need to spk w/ NC aboout her pain , lmssg w/ NC Niki to cb

## 2022-01-30 NOTE — Telephone Encounter
Margreta Journey called regarding the pt needing to discuss the [place] results.     Transferred to [employer]

## 2022-01-30 NOTE — Telephone Encounter
Pt called needing lab order re faxed to [facility]. She is currently trying to get labs and state that they haven't received the orders. Please re fax to [phone]    [name] stated that she would re fax orders

## 2022-02-02 ENCOUNTER — Encounter: Admit: 2022-02-02 | Discharge: 2022-02-02 | Payer: MEDICARE

## 2022-02-04 ENCOUNTER — Encounter: Admit: 2022-02-04 | Discharge: 2022-02-04 | Payer: MEDICARE

## 2022-02-05 ENCOUNTER — Encounter: Admit: 2022-02-05 | Discharge: 2022-02-05 | Payer: MEDICARE

## 2022-02-05 NOTE — Telephone Encounter
Ally with [facility] called in to say that the previous lab results were in accurate and she has the correct results. Please return her call @ [phone]

## 2022-02-05 NOTE — Telephone Encounter
Per Radonna Ricker: quest resulted wrong tacro from 7/31. Advised we have not received any 7/31 results and have not received tacro from 7/19 despite multiple request. They will fax

## 2022-02-06 ENCOUNTER — Encounter: Admit: 2022-02-06 | Discharge: 2022-02-06 | Payer: MEDICARE

## 2022-02-06 DIAGNOSIS — Z940 Kidney transplant status: Secondary | ICD-10-CM

## 2022-02-06 DIAGNOSIS — D849 Immunodeficiency, unspecified: Secondary | ICD-10-CM

## 2022-02-06 DIAGNOSIS — N183 Stage 3 chronic kidney disease, unspecified whether stage 3a or 3b CKD (HCC): Secondary | ICD-10-CM

## 2022-02-06 DIAGNOSIS — Z79899 Other long term (current) drug therapy: Secondary | ICD-10-CM

## 2022-02-06 LAB — TACROLIMUS LC-MS/MS: TACROLIMUS LC-MS/MS: 6.5

## 2022-02-09 ENCOUNTER — Encounter: Admit: 2022-02-09 | Discharge: 2022-02-09 | Payer: MEDICARE

## 2022-02-09 DIAGNOSIS — Z940 Kidney transplant status: Secondary | ICD-10-CM

## 2022-02-09 DIAGNOSIS — N183 Stage 3 chronic kidney disease, unspecified whether stage 3a or 3b CKD (HCC): Secondary | ICD-10-CM

## 2022-02-09 LAB — PARATHYROID HORMONE: PTH: 51

## 2022-02-09 LAB — 25-OH VITAMIN D (D2 + D3): VITAMIN D (25-OH) TOTAL: 28 — ABNORMAL LOW (ref 30–100)

## 2022-02-10 ENCOUNTER — Encounter: Admit: 2022-02-10 | Discharge: 2022-02-10 | Payer: MEDICARE

## 2022-02-10 NOTE — Telephone Encounter
Reviewed labs from 02/02/2022 at this time    Hgb 11.7  WBC 5.74  Creatinine  1.02  Mg 2.0  Phos 2.7  Tacrolimus  6.5 (Goal 5-8)  UPC 0.1  CMV not detected      Received the following orders from Dr. Lyndel Safe :  No dose change     Reviewed labs with patient at this time via mychart

## 2022-02-18 ENCOUNTER — Encounter: Admit: 2022-02-18 | Discharge: 2022-02-18 | Payer: MEDICARE

## 2022-02-19 ENCOUNTER — Encounter: Admit: 2022-02-19 | Discharge: 2022-02-19 | Payer: MEDICARE

## 2022-02-19 DIAGNOSIS — Z79899 Other long term (current) drug therapy: Secondary | ICD-10-CM

## 2022-02-19 DIAGNOSIS — Z940 Kidney transplant status: Secondary | ICD-10-CM

## 2022-02-19 DIAGNOSIS — D849 Immunodeficiency, unspecified: Secondary | ICD-10-CM

## 2022-02-19 LAB — CBC AND DIFF
ABSOLUTE BASO COUNT: 0 /uL (ref 0.01–0.08)
ABSOLUTE EOS COUNT: 0 /uL (ref 0.04–0.54)
ABSOLUTE LYMPH COUNT: 1.9 /uL (ref 1.18–3.174)
ABSOLUTE MONO COUNT: 0.6 /uL (ref 0.24–0.86)
ABSOLUTE NEUTROPHIL: 3.2 /uL (ref 1.56–6.13)
HEMOGLOBIN: 10 g/dL — ABNORMAL LOW (ref 11.2–15.7)
LYMPHOCYTES: 32 % (ref 19.3–53.1)
PLATELET COUNT: 263 /uL (ref 182–369)
RBC COUNT: 3.5 uL — ABNORMAL LOW (ref 3.93–5.22)
RDW: 14 /HPF (ref 0–5)
WBC COUNT: 5.9 uL (ref 3.98–10.04)

## 2022-02-19 LAB — URINALYSIS MICROSCOPIC REFLEX TO CULTURE

## 2022-02-19 LAB — URINALYSIS DIPSTICK REFLEX TO CULTURE
URINE BILE: NEGATIVE % (ref 4.7–12.5)
URINE BLOOD: NEGATIVE % (ref 34.0–71.1)
URINE KETONE: NEGATIVE mg/dL (ref 70–100)
URINE SPEC GRAVITY: 1 % — AB (ref 1.002–1.030)
UROBILINOGEN: 0.2 % (ref 0.7–7.0)

## 2022-02-19 LAB — COMPREHENSIVE METABOLIC PANEL
ALBUMIN: 4.1
ALK PHOSPHATASE: 56 U/L (ref 40–150)
ALT: 16 U/L (ref 0–55)
AST: 10 U/L (ref 5–34)
CALCIUM: 9.3 mg/dL (ref 8.4–10.2)
CHLORIDE: 107 mmol/L
CREATININE: 1.1 mg/dL — ABNORMAL HIGH (ref 0.57–1.11)
EGFR: 56 mL — ABNORMAL LOW (ref >59)
GLUCOSE,RANDOM: 118 mg/dL — ABNORMAL HIGH (ref 70–105)
POTASSIUM: 4.5 mmol/L (ref 3.5–5.1)
SODIUM: 137 mmol/L (ref 136–145)
TOTAL BILIRUBIN: 0.3 mg/dL (ref 0.2–1.2)
TOTAL PROTEIN: 7 g/dL (ref 6.4–8.3)

## 2022-02-19 LAB — MAGNESIUM: MAGNESIUM: 1.8 mg/dL (ref 1.6–2.6)

## 2022-02-19 LAB — PROTEIN/CR RATIO,UR RAN
PROT CREAT RAT/CAL: 0 mL — ABNORMAL HIGH (ref <0.2)
UR CREATININE, RAN: 129 mg/dL — ABNORMAL HIGH (ref 47–110)
UR TOTAL PROTEIN,RAN: 10 mg/dL (ref 1–14)

## 2022-02-19 LAB — PHOSPHORUS: PHOSPHORUS: 3.3 mg/dL

## 2022-02-19 LAB — URIC ACID: URIC ACID: 4.8 (ref 2.6–6.0)

## 2022-02-19 NOTE — Telephone Encounter
CMV negative x 2    Received the following orders from Dr. Lyndel Safe:    Discontinue Valcyte     Mychart message to patient to discontinue medication. Medication list updated

## 2022-02-23 ENCOUNTER — Encounter: Admit: 2022-02-23 | Discharge: 2022-02-23 | Payer: MEDICARE

## 2022-02-23 DIAGNOSIS — Z79899 Other long term (current) drug therapy: Secondary | ICD-10-CM

## 2022-02-23 DIAGNOSIS — Z940 Kidney transplant status: Secondary | ICD-10-CM

## 2022-02-23 DIAGNOSIS — D849 Immunodeficiency, unspecified: Secondary | ICD-10-CM

## 2022-02-23 LAB — TACROLIMUS LC-MS/MS: TACROLIMUS LC-MS/MS: 7.6 ug/L (ref 5.0–20.0)

## 2022-02-23 LAB — BK VIRUS DNA, QUANT PLASMA

## 2022-03-04 ENCOUNTER — Encounter: Admit: 2022-03-04 | Discharge: 2022-03-04 | Payer: MEDICARE

## 2022-03-04 DIAGNOSIS — Z940 Kidney transplant status: Secondary | ICD-10-CM

## 2022-03-04 DIAGNOSIS — Z79899 Other long term (current) drug therapy: Secondary | ICD-10-CM

## 2022-03-04 DIAGNOSIS — D849 Immunodeficiency, unspecified: Secondary | ICD-10-CM

## 2022-03-04 LAB — URINALYSIS DIPSTICK REFLEX TO CULTURE
GLUCOSE,UA: NEGATIVE
LEUKOCYTES: NEGATIVE
NITRITE: NEGATIVE
PROTEIN,UA: NEGATIVE
URINE BILE: NEGATIVE
URINE BLOOD: NEGATIVE
URINE KETONE: NEGATIVE
URINE PH: 6 — ABNORMAL LOW (ref 34.1–44.9)
URINE SPEC GRAVITY: 1
UROBILINOGEN: 0.2

## 2022-03-04 LAB — CBC AND DIFF
ABSOLUTE BASO COUNT: 0
ABSOLUTE EOS COUNT: 0.1
ABSOLUTE LYMPH COUNT: 1.8
ABSOLUTE MONO COUNT: 0.7
ABSOLUTE NEUTROPHIL: 4.9
BASOPHILS: 0.7
EOSINOPHIL %: 1.4
WBC COUNT: 7.6

## 2022-03-04 LAB — COMPREHENSIVE METABOLIC PANEL
ALBUMIN: 3.9
ALK PHOSPHATASE: 57
ALT: 17
ANION GAP: 9
AST: 13
BLD UREA NITROGEN: 24 — ABNORMAL HIGH (ref 7.0–18.7)
CALCIUM: 9.4
CHLORIDE: 105
CREATININE: 1.1 mg/dL
EGFR: 57 — ABNORMAL LOW (ref >59)
POTASSIUM: 4.2
SODIUM: 138 mmol/L
TOTAL BILIRUBIN: 0.3 mg/dL

## 2022-03-04 LAB — PROTEIN/CR RATIO,UR RAN: UR CREATININE, RAN: 68

## 2022-03-04 LAB — MAGNESIUM: MAGNESIUM: 1.6

## 2022-03-04 LAB — URIC ACID: URIC ACID: 5

## 2022-03-04 LAB — PHOSPHORUS: PHOSPHORUS: 3.6

## 2022-03-05 ENCOUNTER — Encounter: Admit: 2022-03-05 | Discharge: 2022-03-05 | Payer: MEDICARE

## 2022-03-13 ENCOUNTER — Encounter: Admit: 2022-03-13 | Discharge: 2022-03-13 | Payer: MEDICARE

## 2022-03-13 DIAGNOSIS — Z940 Kidney transplant status: Secondary | ICD-10-CM

## 2022-03-13 DIAGNOSIS — D849 Immunodeficiency, unspecified: Secondary | ICD-10-CM

## 2022-03-13 DIAGNOSIS — Z79899 Other long term (current) drug therapy: Secondary | ICD-10-CM

## 2022-03-13 LAB — TACROLIMUS LC-MS/MS: TACROLIMUS LC-MS/MS: 7.8

## 2022-03-13 LAB — BK VIRUS DNA, QUANT PLASMA

## 2022-03-19 ENCOUNTER — Encounter: Admit: 2022-03-19 | Discharge: 2022-03-19 | Payer: MEDICARE

## 2022-03-19 DIAGNOSIS — Z79899 Other long term (current) drug therapy: Secondary | ICD-10-CM

## 2022-03-19 DIAGNOSIS — D849 Immunodeficiency, unspecified: Secondary | ICD-10-CM

## 2022-03-19 DIAGNOSIS — Z940 Kidney transplant status: Secondary | ICD-10-CM

## 2022-03-19 LAB — URINALYSIS DIPSTICK REFLEX TO CULTURE
LEUKOCYTES: NEGATIVE
NITRITE: NEGATIVE mg/dL
URINE BILE: NEGATIVE
URINE KETONE: NEGATIVE K/UL — ABNORMAL HIGH (ref 70–105)
UROBILINOGEN: 0.2

## 2022-03-19 LAB — PROTEIN/CR RATIO,UR RAN
PROT CREAT RAT/CAL: 0 K/UL — AB (ref >59)
UR CREATININE, RAN: 83 K/UL — ABNORMAL HIGH (ref 7.0–18.7)
UR TOTAL PROTEIN,RAN: 7.5 mg/dL — AB (ref 0.57–1.11)

## 2022-03-19 LAB — CBC AND DIFF
ABSOLUTE BASO COUNT: 0
ABSOLUTE EOS COUNT: 0.1
ABSOLUTE LYMPH COUNT: 2.1
ABSOLUTE MONO COUNT: 0.6
ABSOLUTE NEUTROPHIL: 4.5
BASOPHILS: 0.7
EOSINOPHIL %: 1.8
WBC COUNT: 7.5 % — ABNORMAL LOW (ref 24–44)

## 2022-03-19 LAB — URIC ACID: URIC ACID: 4.7 FL (ref 7–11)

## 2022-03-19 LAB — MAGNESIUM: MAGNESIUM: 1.7 K/UL (ref 150–400)

## 2022-03-19 LAB — URINALYSIS MICROSCOPIC REFLEX TO CULTURE

## 2022-03-19 LAB — COMPREHENSIVE METABOLIC PANEL: SODIUM: 139 mmol/L (ref 41–77)

## 2022-03-19 LAB — PHOSPHORUS: PHOSPHORUS: 3.2 g/dL (ref 32.0–36.0)

## 2022-03-23 ENCOUNTER — Encounter: Admit: 2022-03-23 | Discharge: 2022-03-23 | Payer: MEDICARE

## 2022-03-23 DIAGNOSIS — D849 Immunodeficiency, unspecified: Secondary | ICD-10-CM

## 2022-03-23 DIAGNOSIS — Z79899 Other long term (current) drug therapy: Secondary | ICD-10-CM

## 2022-03-23 DIAGNOSIS — Z940 Kidney transplant status: Secondary | ICD-10-CM

## 2022-03-23 LAB — BK VIRUS DNA, QUANT PLASMA

## 2022-03-23 LAB — CMV QUANT PCR-BLOOD

## 2022-03-23 LAB — TACROLIMUS LC-MS/MS: TACROLIMUS LC-MS/MS: 7.7 ug/L (ref 5.0–20.0)

## 2022-03-26 ENCOUNTER — Encounter: Admit: 2022-03-26 | Discharge: 2022-03-26 | Payer: MEDICARE

## 2022-03-27 ENCOUNTER — Encounter: Admit: 2022-03-27 | Discharge: 2022-03-27 | Payer: MEDICARE

## 2022-03-27 DIAGNOSIS — Z940 Kidney transplant status: Secondary | ICD-10-CM

## 2022-03-27 MED ORDER — MYCOPHENOLATE SODIUM 180 MG PO TBEC
180 mg | ORAL_TABLET | Freq: Two times a day (BID) | ORAL | 3 refills | Status: AC
Start: 2022-03-27 — End: ?
  Filled 2022-03-31: qty 180, 90d supply, fill #1

## 2022-03-27 NOTE — Telephone Encounter
Received the following orders from Dr. Lyndel Safe:    Start Myfortic 180 mg BID     Email to patient with dose change. New MAP generated and sent to patient and Tess.

## 2022-03-29 ENCOUNTER — Encounter: Admit: 2022-03-29 | Discharge: 2022-03-29 | Payer: MEDICARE

## 2022-03-30 ENCOUNTER — Encounter: Admit: 2022-03-30 | Discharge: 2022-03-30 | Payer: MEDICARE

## 2022-04-01 ENCOUNTER — Encounter: Admit: 2022-04-01 | Discharge: 2022-04-01 | Payer: MEDICARE

## 2022-04-02 ENCOUNTER — Encounter: Admit: 2022-04-02 | Discharge: 2022-04-02 | Payer: MEDICARE

## 2022-04-02 DIAGNOSIS — Z79899 Other long term (current) drug therapy: Secondary | ICD-10-CM

## 2022-04-02 DIAGNOSIS — Z940 Kidney transplant status: Secondary | ICD-10-CM

## 2022-04-02 DIAGNOSIS — D849 Immunodeficiency, unspecified: Secondary | ICD-10-CM

## 2022-04-02 LAB — COMPREHENSIVE METABOLIC PANEL
ALBUMIN: 4.1
ALK PHOSPHATASE: 67
SODIUM: 137 mmol/L
TOTAL PROTEIN: 7.2

## 2022-04-02 LAB — URINALYSIS MICROSCOPIC REFLEX TO CULTURE

## 2022-04-02 LAB — URINALYSIS DIPSTICK REFLEX TO CULTURE
LEUKOCYTES: NEGATIVE
NITRITE: NEGATIVE
URINE BILE: NEGATIVE mg/dL
URINE KETONE: NEGATIVE — ABNORMAL LOW (ref >59)
UROBILINOGEN: 0.2

## 2022-04-02 LAB — PROTEIN/CR RATIO,UR RAN
PROT CREAT RAT/CAL: 0.1 mg/dL — ABNORMAL HIGH (ref 0.57–1.11)
UR CREATININE, RAN: 37 — ABNORMAL LOW (ref 47–110)
UR TOTAL PROTEIN,RAN: 7.5 — ABNORMAL HIGH (ref 7.0–18.7)

## 2022-04-02 LAB — URIC ACID: URIC ACID: 5.7

## 2022-04-02 LAB — MAGNESIUM: MAGNESIUM: 1.6

## 2022-04-02 LAB — PHOSPHORUS: PHOSPHORUS: 4

## 2022-04-06 ENCOUNTER — Encounter: Admit: 2022-04-06 | Discharge: 2022-04-06 | Payer: MEDICARE

## 2022-04-06 DIAGNOSIS — Z79899 Other long term (current) drug therapy: Secondary | ICD-10-CM

## 2022-04-06 DIAGNOSIS — D849 Immunodeficiency, unspecified: Secondary | ICD-10-CM

## 2022-04-06 DIAGNOSIS — Z940 Kidney transplant status: Secondary | ICD-10-CM

## 2022-04-06 LAB — TACROLIMUS LC-MS/MS: TACROLIMUS LC-MS/MS: 7.1 ug/L (ref 5.0–20.0)

## 2022-04-06 LAB — BK VIRUS DNA, QUANT PLASMA

## 2022-04-08 ENCOUNTER — Ambulatory Visit: Admit: 2022-04-08 | Discharge: 2022-04-08 | Payer: No Typology Code available for payment source

## 2022-04-08 ENCOUNTER — Encounter: Admit: 2022-04-08 | Discharge: 2022-04-08 | Payer: MEDICARE

## 2022-04-08 ENCOUNTER — Encounter: Admit: 2022-04-08 | Discharge: 2022-04-08 | Payer: No Typology Code available for payment source

## 2022-04-08 DIAGNOSIS — B348 Other viral infections of unspecified site: Secondary | ICD-10-CM

## 2022-04-08 DIAGNOSIS — R413 Other amnesia: Secondary | ICD-10-CM

## 2022-04-08 DIAGNOSIS — K3184 Gastroparesis: Secondary | ICD-10-CM

## 2022-04-08 DIAGNOSIS — Z940 Kidney transplant status: Secondary | ICD-10-CM

## 2022-04-08 DIAGNOSIS — D849 Immunodeficiency, unspecified: Secondary | ICD-10-CM

## 2022-04-08 DIAGNOSIS — Z79899 Other long term (current) drug therapy: Secondary | ICD-10-CM

## 2022-04-08 DIAGNOSIS — N289 Disorder of kidney and ureter, unspecified: Secondary | ICD-10-CM

## 2022-04-08 DIAGNOSIS — E119 Type 2 diabetes mellitus without complications: Secondary | ICD-10-CM

## 2022-04-08 DIAGNOSIS — I10 Essential (primary) hypertension: Secondary | ICD-10-CM

## 2022-04-08 DIAGNOSIS — I639 Cerebral infarction, unspecified: Secondary | ICD-10-CM

## 2022-04-08 LAB — IRON + BINDING CAPACITY + %SAT+ FERRITIN
% SATURATION: 24 % — ABNORMAL LOW (ref 28–42)
FERRITIN: 123 ng/mL (ref 10–200)
IRON BINDING: 295 ug/dL (ref 270–380)
IRON: 70 ug/dL (ref 50–160)

## 2022-04-08 LAB — LIPID PROFILE
CHOLESTEROL: 158 mg/dL (ref <200)
HDL: 69 mg/dL (ref >40)
LDL: 71 mg/dL (ref <100)
NON HDL CHOLESTEROL: 89 mg/dL — ABNORMAL LOW (ref 30–80)
TRIGLYCERIDES: 93 mg/dL (ref <150)
VLDL: 19 mg/dL

## 2022-04-08 LAB — COMPREHENSIVE METABOLIC PANEL
ALBUMIN: 4.3 g/dL (ref 3.5–5.0)
ALT: 15 U/L (ref 7–56)
BLD UREA NITROGEN: 25 mg/dL — ABNORMAL LOW (ref 7–25)
CHLORIDE: 104 MMOL/L (ref 98–110)
CO2: 24 MMOL/L (ref 21–30)
GLUCOSE,PANEL: 109 mg/dL — ABNORMAL HIGH (ref 70–100)
POTASSIUM: 4.9 MMOL/L (ref 3.5–5.1)
SODIUM: 138 MMOL/L (ref 137–147)

## 2022-04-08 LAB — URINALYSIS DIPSTICK REFLEX TO CULTURE
GLUCOSE,UA: NEGATIVE
LEUKOCYTES: NEGATIVE
NITRITE: NEGATIVE
URINE ASCORBIC ACID, UA: NEGATIVE
URINE BILE: NEGATIVE
URINE BLOOD: NEGATIVE
URINE KETONE: NEGATIVE
URINE PH: 7 (ref 5.0–8.0)
URINE SPEC GRAVITY: 1 (ref 1.005–1.030)

## 2022-04-08 LAB — FK506 TACROLIMUS: TACROLIMUS: 9.7 ng/mL (ref 5.0–15.0)

## 2022-04-08 LAB — CBC AND DIFF
ABSOLUTE BASO COUNT: 0 K/UL (ref 0–0.20)
ABSOLUTE EOS COUNT: 0.1 K/UL (ref 0–0.45)
ABSOLUTE LYMPH COUNT: 1.7 K/UL (ref 1.0–4.8)
ABSOLUTE MONO COUNT: 0.5 K/UL (ref 0–0.80)
ABSOLUTE NEUTROPHIL: 5 K/UL (ref 1.8–7.0)
BASOPHILS %: 1 % (ref 0–2)
EOSINOPHILS %: 2 % (ref 0–5)
HEMOGLOBIN: 11 g/dL — ABNORMAL LOW (ref 12.0–15.0)
LYMPHOCYTES %: 23 % — ABNORMAL LOW (ref 24–44)
MCH: 30 pg (ref 26–34)
MCV: 93 FL (ref 80–100)
MONOCYTES %: 7 % — ABNORMAL LOW (ref >60)
PLATELET COUNT: 287 K/UL (ref 150–400)
RDW: 14 % (ref 11–15)

## 2022-04-08 LAB — ALLOSURE

## 2022-04-08 LAB — MAGNESIUM: MAGNESIUM: 1.9 mg/dL — ABNORMAL LOW (ref 1.6–2.6)

## 2022-04-08 LAB — URINALYSIS MICROSCOPIC REFLEX TO CULTURE

## 2022-04-08 LAB — HEMOGLOBIN A1C: HEMOGLOBIN A1C: 7.7 % — ABNORMAL HIGH (ref 4.0–5.7)

## 2022-04-08 LAB — PARATHYROID HORMONE: PTH HORMONE: 112 pg/mL — ABNORMAL HIGH (ref 10–65)

## 2022-04-08 NOTE — Patient Instructions
Return to see Dr. Lyndel Safe in 3 months    Labs every two weeks    Med changes: none

## 2022-04-14 ENCOUNTER — Encounter: Admit: 2022-04-14 | Discharge: 2022-04-14 | Payer: No Typology Code available for payment source

## 2022-04-14 DIAGNOSIS — Z940 Kidney transplant status: Secondary | ICD-10-CM

## 2022-04-14 DIAGNOSIS — T8610 Unspecified complication of kidney transplant: Secondary | ICD-10-CM

## 2022-04-14 NOTE — Telephone Encounter
Reviewed labs from 04/08/2022 at this time    Hgb 11.1  WBC 7.5  Creatinine  1.43  Mg 1.9  Phos 4.0  Tacrolimus 9.7(Goal 5-10)  Iron 70  %sat 24  TIBC 295  Ferritin 123  A1C 7.7  PTH 112.2  Cholesterol 158  Triglycerides 93  HDL 69  LDL 71  Vit D 28.5 patient on vit d supplement    Received the following orders from Dr. Lyndel Safe :  Transplant biopsy next week   INR to be drawn tomorrow     Reviewed labs with patient at this time via mychart.

## 2022-04-15 ENCOUNTER — Encounter: Admit: 2022-04-15 | Discharge: 2022-04-15 | Payer: No Typology Code available for payment source

## 2022-04-15 DIAGNOSIS — D849 Immunodeficiency, unspecified: Secondary | ICD-10-CM

## 2022-04-15 DIAGNOSIS — Z940 Kidney transplant status: Secondary | ICD-10-CM

## 2022-04-15 DIAGNOSIS — Z79899 Other long term (current) drug therapy: Secondary | ICD-10-CM

## 2022-04-15 LAB — MAGNESIUM: MAGNESIUM: 1.7

## 2022-04-15 LAB — COMPREHENSIVE METABOLIC PANEL
ALBUMIN: 3.9
ALK PHOSPHATASE: 60
ALT: 18
ANION GAP: 8
AST: 15
BLD UREA NITROGEN: 19 — ABNORMAL HIGH (ref 7.0–18.7)
CALCIUM: 9.4
CHLORIDE: 108 — ABNORMAL HIGH (ref 98–107)
CO2: 23
CREATININE: 1.1 mg/dL — ABNORMAL HIGH (ref 0.57–1.11)
EGFR: 56 — ABNORMAL LOW (ref >59)
GLUCOSE,RANDOM: 96
POTASSIUM: 4.7
SODIUM: 139 mmol/L
TOTAL BILIRUBIN: 0.3 mg/dL
TOTAL PROTEIN: 7.3

## 2022-04-15 LAB — PROTIME INR (PT): PROTIME: 12 s — ABNORMAL HIGH (ref 10–12.4)

## 2022-04-15 LAB — URIC ACID: URIC ACID: 5.5

## 2022-04-15 LAB — PHOSPHORUS: PHOSPHORUS: 3.1

## 2022-04-16 ENCOUNTER — Encounter: Admit: 2022-04-16 | Discharge: 2022-04-16 | Payer: No Typology Code available for payment source

## 2022-04-20 ENCOUNTER — Encounter: Admit: 2022-04-20 | Discharge: 2022-04-20 | Payer: MEDICARE

## 2022-04-20 DIAGNOSIS — Z940 Kidney transplant status: Secondary | ICD-10-CM

## 2022-04-20 DIAGNOSIS — D849 Immunodeficiency, unspecified: Secondary | ICD-10-CM

## 2022-04-20 DIAGNOSIS — Z79899 Other long term (current) drug therapy: Secondary | ICD-10-CM

## 2022-04-20 LAB — TACROLIMUS LC-MS/MS: TACROLIMUS LC-MS/MS: 8 mg/dL (ref 5.0–20.0)

## 2022-04-20 LAB — BK VIRUS DNA, QUANT PLASMA

## 2022-04-20 NOTE — Telephone Encounter
Received the following orders from Dr. Lyndel Safe:     Cancel transplant biopsy     Tess support person notified via phone. Patient notified via mychart. IR notified via email.

## 2022-05-06 ENCOUNTER — Encounter: Admit: 2022-05-06 | Discharge: 2022-05-06 | Payer: MEDICARE

## 2022-05-06 DIAGNOSIS — Z79899 Other long term (current) drug therapy: Secondary | ICD-10-CM

## 2022-05-06 DIAGNOSIS — D849 Immunodeficiency, unspecified: Secondary | ICD-10-CM

## 2022-05-06 DIAGNOSIS — Z940 Kidney transplant status: Secondary | ICD-10-CM

## 2022-05-06 LAB — URINALYSIS DIPSTICK REFLEX TO CULTURE
GLUCOSE,UA: NEGATIVE
LEUKOCYTES: NEGATIVE
NITRITE: NEGATIVE
PROTEIN,UA: NEGATIVE
URINE BILE: NEGATIVE
URINE BLOOD: NEGATIVE
URINE KETONE: NEGATIVE
URINE PH: 5.5
URINE SPEC GRAVITY: 1
UROBILINOGEN: 0.2

## 2022-05-06 LAB — COMPREHENSIVE METABOLIC PANEL
ALBUMIN: 3.9
ALK PHOSPHATASE: 59
ALT: 15
ANION GAP: 10
AST: 14
BLD UREA NITROGEN: 27 — ABNORMAL HIGH (ref 7.0–18.7)
CALCIUM: 9.6
CREATININE: 1.3 mg/dL — ABNORMAL HIGH (ref 0.57–1.11)
EGFR: 47 — ABNORMAL LOW (ref >59)
GLUCOSE,RANDOM: 131 — ABNORMAL HIGH (ref 70–105)
SODIUM: 139 mmol/L
TOTAL BILIRUBIN: 0.3 mg/dL
TOTAL PROTEIN: 7.4

## 2022-05-06 LAB — MAGNESIUM: MAGNESIUM: 1.5 — ABNORMAL LOW (ref 1.6–2.6)

## 2022-05-06 LAB — PHOSPHORUS: PHOSPHORUS: 3.7 — ABNORMAL HIGH (ref 98–107)

## 2022-05-06 LAB — URIC ACID: URIC ACID: 5.6 — ABNORMAL LOW (ref 22–29)

## 2022-05-07 ENCOUNTER — Encounter: Admit: 2022-05-07 | Discharge: 2022-05-07 | Payer: MEDICARE

## 2022-05-07 DIAGNOSIS — Z940 Kidney transplant status: Secondary | ICD-10-CM

## 2022-05-07 DIAGNOSIS — D849 Immunodeficiency, unspecified: Secondary | ICD-10-CM

## 2022-05-07 DIAGNOSIS — Z79899 Other long term (current) drug therapy: Secondary | ICD-10-CM

## 2022-05-07 LAB — PROTEIN/CR RATIO,UR RAN
PROT CREAT RAT/CAL: 0 mL (ref <0.2)
UR CREATININE, RAN: 198 mg/dL — ABNORMAL HIGH (ref 47–110)
UR TOTAL PROTEIN,RAN: 13 mg/dL (ref 1–14)

## 2022-05-11 ENCOUNTER — Encounter: Admit: 2022-05-11 | Discharge: 2022-05-11 | Payer: MEDICARE

## 2022-05-11 DIAGNOSIS — D849 Immunodeficiency, unspecified: Secondary | ICD-10-CM

## 2022-05-11 DIAGNOSIS — Z79899 Other long term (current) drug therapy: Secondary | ICD-10-CM

## 2022-05-11 DIAGNOSIS — Z940 Kidney transplant status: Secondary | ICD-10-CM

## 2022-05-11 LAB — BK VIRUS DNA, QUANT PLASMA

## 2022-05-11 LAB — TACROLIMUS LC-MS/MS: TACROLIMUS LC-MS/MS: 9.3 ug/L (ref 5.0–20.0)

## 2022-05-12 ENCOUNTER — Encounter: Admit: 2022-05-12 | Discharge: 2022-05-12 | Payer: MEDICARE

## 2022-05-12 DIAGNOSIS — D849 Immunodeficiency, unspecified: Secondary | ICD-10-CM

## 2022-05-12 DIAGNOSIS — Z940 Kidney transplant status: Secondary | ICD-10-CM

## 2022-05-12 DIAGNOSIS — Z79899 Other long term (current) drug therapy: Secondary | ICD-10-CM

## 2022-05-12 LAB — CMV QUANT PCR-BLOOD

## 2022-05-19 ENCOUNTER — Encounter: Admit: 2022-05-19 | Discharge: 2022-05-19 | Payer: MEDICARE

## 2022-06-02 ENCOUNTER — Encounter: Admit: 2022-06-02 | Discharge: 2022-06-02 | Payer: MEDICARE

## 2022-06-02 DIAGNOSIS — D849 Immunodeficiency, unspecified: Secondary | ICD-10-CM

## 2022-06-02 DIAGNOSIS — Z940 Kidney transplant status: Secondary | ICD-10-CM

## 2022-06-02 DIAGNOSIS — Z79899 Other long term (current) drug therapy: Secondary | ICD-10-CM

## 2022-06-02 LAB — URINALYSIS DIPSTICK REFLEX TO CULTURE
GLUCOSE,UA: NEGATIVE
NITRITE: NEGATIVE
URINE BILE: NEGATIVE
URINE KETONE: NEGATIVE
UROBILINOGEN: 0.2

## 2022-06-02 LAB — CBC AND DIFF
ABSOLUTE BASO COUNT: 0
ABSOLUTE EOS COUNT: 0.1
ABSOLUTE LYMPH COUNT: 1.9
ABSOLUTE MONO COUNT: 0.6
ABSOLUTE NEUTROPHIL: 4.4
BASOPHILS: 0.6
EOSINOPHIL %: 1.7
WBC COUNT: 7.1

## 2022-06-02 LAB — TACROLIMUS LC-MS/MS: TACROLIMUS LC-MS/MS: 6.8

## 2022-06-02 LAB — URINALYSIS MICROSCOPIC REFLEX TO CULTURE

## 2022-06-02 LAB — URIC ACID: URIC ACID: 5.3

## 2022-06-02 LAB — PHOSPHORUS: PHOSPHORUS: 3.4

## 2022-06-02 LAB — PROTEIN/CR RATIO,UR RAN
PROT CREAT RAT/CAL: 0 mg/dL
UR CREATININE, RAN: 97 — ABNORMAL LOW (ref 34.1–44.9)
UR TOTAL PROTEIN,RAN: 8.4 — ABNORMAL HIGH (ref 7.0–18.7)

## 2022-06-02 LAB — COMPREHENSIVE METABOLIC PANEL: SODIUM: 137 mmol/L

## 2022-06-02 LAB — CMV QUANT PCR-BLOOD

## 2022-06-02 LAB — MAGNESIUM: MAGNESIUM: 1.8

## 2022-06-04 ENCOUNTER — Encounter: Admit: 2022-06-04 | Discharge: 2022-06-04 | Payer: MEDICARE

## 2022-06-04 DIAGNOSIS — D849 Immunodeficiency, unspecified: Secondary | ICD-10-CM

## 2022-06-04 DIAGNOSIS — Z79899 Other long term (current) drug therapy: Secondary | ICD-10-CM

## 2022-06-04 DIAGNOSIS — Z940 Kidney transplant status: Secondary | ICD-10-CM

## 2022-06-04 LAB — BK VIRUS QUANT PLASMA

## 2022-06-12 ENCOUNTER — Encounter: Admit: 2022-06-12 | Discharge: 2022-06-12 | Payer: MEDICARE

## 2022-06-16 ENCOUNTER — Encounter: Admit: 2022-06-16 | Discharge: 2022-06-16 | Payer: MEDICARE

## 2022-06-16 DIAGNOSIS — Z79899 Other long term (current) drug therapy: Secondary | ICD-10-CM

## 2022-06-16 DIAGNOSIS — Z940 Kidney transplant status: Secondary | ICD-10-CM

## 2022-06-16 DIAGNOSIS — D849 Immunodeficiency, unspecified: Secondary | ICD-10-CM

## 2022-06-16 LAB — URINALYSIS DIPSTICK REFLEX TO CULTURE
LEUKOCYTES: NEGATIVE
NITRITE: NEGATIVE
PROTEIN,UA: NEGATIVE
URINE BILE: NEGATIVE
URINE BLOOD: NEGATIVE
URINE PH: 5.5 (ref 5.0–7.0)
URINE SPEC GRAVITY: 1 (ref 1.002–1.030)
UROBILINOGEN: 0.2

## 2022-06-16 LAB — CBC AND DIFF
HEMATOCRIT: 33 % — ABNORMAL LOW (ref 34.1–44.9)
MCV: 92 fL (ref 79.4–94.8)
RBC COUNT: 3.6 x10 6/uL — ABNORMAL LOW (ref 3.93–5.22)
WBC COUNT: 8 uL

## 2022-06-16 LAB — URIC ACID: URIC ACID: 5.7

## 2022-06-16 LAB — COMPREHENSIVE METABOLIC PANEL
ALBUMIN: 4.1
ANION GAP: 7 meq/L — ABNORMAL LOW (ref 8–16)
BLD UREA NITROGEN: 26 mg/dL — ABNORMAL HIGH (ref 7.0–18.7)
CALCIUM: 9.9 mg/dL (ref 0.1–1.12)
CHLORIDE: 107 mmol/L (ref 98–107)
CO2: 23 mmol/L (ref 22–29)
CREATININE: 1.2 mg/dL — ABNORMAL HIGH (ref 0.57–1.11)
EGFR: 52 % — ABNORMAL LOW (ref >59)
GLUCOSE,RANDOM: 173 % — ABNORMAL HIGH (ref 70–105)
POTASSIUM: 4.4 mmol/L (ref 32.2–35.5)
SODIUM: 137 mmol/L (ref 136–145)
TOTAL BILIRUBIN: 0.5 mg/dL (ref 1.56–6.13)
TOTAL PROTEIN: 7.7 g/dL (ref 0.01–0.08)

## 2022-06-16 LAB — PROTEIN/CR RATIO,UR RAN
PROT CREAT RAT/CAL: 0 U/L (ref <0.2)
UR CREATININE, RAN: 95 mg/dL (ref 5–34)
UR TOTAL PROTEIN,RAN: 7.5 mg/dL (ref 1–14)

## 2022-06-16 LAB — PHOSPHORUS: PHOSPHORUS: 2.7 mg/dL (ref 2.3–4.7)

## 2022-06-16 LAB — MAGNESIUM: MAGNESIUM: 1.8 mg/dL (ref 1.6–2.6)

## 2022-06-17 ENCOUNTER — Encounter: Admit: 2022-06-17 | Discharge: 2022-06-17 | Payer: MEDICARE

## 2022-06-18 ENCOUNTER — Encounter: Admit: 2022-06-18 | Discharge: 2022-06-18 | Payer: MEDICARE

## 2022-06-18 DIAGNOSIS — Z79899 Other long term (current) drug therapy: Secondary | ICD-10-CM

## 2022-06-18 DIAGNOSIS — Z940 Kidney transplant status: Secondary | ICD-10-CM

## 2022-06-18 DIAGNOSIS — D849 Immunodeficiency, unspecified: Secondary | ICD-10-CM

## 2022-06-18 LAB — CMV QUANT PCR-BLOOD

## 2022-06-19 ENCOUNTER — Encounter: Admit: 2022-06-19 | Discharge: 2022-06-19 | Payer: MEDICARE

## 2022-06-19 DIAGNOSIS — Z79899 Other long term (current) drug therapy: Secondary | ICD-10-CM

## 2022-06-19 DIAGNOSIS — D849 Immunodeficiency, unspecified: Secondary | ICD-10-CM

## 2022-06-19 DIAGNOSIS — Z940 Kidney transplant status: Secondary | ICD-10-CM

## 2022-06-19 LAB — BK VIRUS QUANT PLASMA

## 2022-06-22 ENCOUNTER — Encounter: Admit: 2022-06-22 | Discharge: 2022-06-22 | Payer: MEDICARE

## 2022-06-23 ENCOUNTER — Encounter: Admit: 2022-06-23 | Discharge: 2022-06-23 | Payer: MEDICARE

## 2022-06-23 MED FILL — MYCOPHENOLATE SODIUM 180 MG PO TBEC: 180 mg | ORAL | 90 days supply | Qty: 180 | Fill #2 | Status: AC

## 2022-06-25 ENCOUNTER — Encounter: Admit: 2022-06-25 | Discharge: 2022-06-25 | Payer: MEDICARE

## 2022-06-26 ENCOUNTER — Encounter: Admit: 2022-06-26 | Discharge: 2022-06-26 | Payer: MEDICARE

## 2022-06-26 MED ORDER — BIOTIN 5 MG PO CAP
1 | ORAL_CAPSULE | Freq: Every day | ORAL | 2 refills | 30.00000 days | Status: AC
Start: 2022-06-26 — End: ?

## 2022-06-26 MED ORDER — CHOLECALCIFEROL (VITAMIN D3) 50 MCG (2,000 UNIT) PO TAB
2000 [IU] | ORAL_TABLET | Freq: Every day | ORAL | 3 refills | 84.00000 days | Status: AC
Start: 2022-06-26 — End: ?

## 2022-06-30 ENCOUNTER — Encounter: Admit: 2022-06-30 | Discharge: 2022-06-30 | Payer: MEDICARE

## 2022-07-08 ENCOUNTER — Encounter: Admit: 2022-07-08 | Discharge: 2022-07-08 | Payer: MEDICARE

## 2022-07-08 DIAGNOSIS — D849 Immunodeficiency, unspecified: Secondary | ICD-10-CM

## 2022-07-08 DIAGNOSIS — Z940 Kidney transplant status: Secondary | ICD-10-CM

## 2022-07-10 ENCOUNTER — Encounter: Admit: 2022-07-10 | Discharge: 2022-07-10 | Payer: MEDICARE

## 2022-07-10 DIAGNOSIS — Z79899 Other long term (current) drug therapy: Secondary | ICD-10-CM

## 2022-07-10 DIAGNOSIS — D849 Immunodeficiency, unspecified: Secondary | ICD-10-CM

## 2022-07-10 DIAGNOSIS — Z940 Kidney transplant status: Secondary | ICD-10-CM

## 2022-07-14 ENCOUNTER — Encounter: Admit: 2022-07-14 | Discharge: 2022-07-14 | Payer: MEDICARE

## 2022-07-14 DIAGNOSIS — D849 Immunodeficiency, unspecified: Secondary | ICD-10-CM

## 2022-07-14 DIAGNOSIS — Z940 Kidney transplant status: Secondary | ICD-10-CM

## 2022-07-14 DIAGNOSIS — Z79899 Other long term (current) drug therapy: Secondary | ICD-10-CM

## 2022-07-14 DIAGNOSIS — T8610 Unspecified complication of kidney transplant: Secondary | ICD-10-CM

## 2022-07-15 ENCOUNTER — Encounter: Admit: 2022-07-15 | Discharge: 2022-07-15 | Payer: MEDICARE

## 2022-07-15 DIAGNOSIS — T8610 Unspecified complication of kidney transplant: Secondary | ICD-10-CM

## 2022-07-15 DIAGNOSIS — Z79899 Other long term (current) drug therapy: Secondary | ICD-10-CM

## 2022-07-15 DIAGNOSIS — D849 Immunodeficiency, unspecified: Secondary | ICD-10-CM

## 2022-07-15 DIAGNOSIS — Z940 Kidney transplant status: Secondary | ICD-10-CM

## 2022-07-16 ENCOUNTER — Encounter: Admit: 2022-07-16 | Discharge: 2022-07-16 | Payer: MEDICARE

## 2022-07-20 ENCOUNTER — Encounter: Admit: 2022-07-20 | Discharge: 2022-07-20 | Payer: MEDICARE

## 2022-07-20 NOTE — Progress Notes
Pharmacy Benefits Investigation    Medication name: TACROLIMUS PO    Contacted [facility] to discuss fill regarding their medication: Tacrolimus.   Left a voicemail for pt to return call to [phone] to schedule a fill .    [facility] Patient Advocate

## 2022-07-21 ENCOUNTER — Encounter: Admit: 2022-07-21 | Discharge: 2022-07-21 | Payer: MEDICARE

## 2022-07-29 ENCOUNTER — Encounter: Admit: 2022-07-29 | Discharge: 2022-07-29 | Payer: MEDICARE

## 2022-07-29 DIAGNOSIS — Z940 Kidney transplant status: Secondary | ICD-10-CM

## 2022-07-29 MED ORDER — TACROLIMUS 1 MG PO CAP
3 mg | ORAL_CAPSULE | Freq: Two times a day (BID) | ORAL | 2 refills
Start: 2022-07-29 — End: ?

## 2022-08-05 ENCOUNTER — Encounter: Admit: 2022-08-05 | Discharge: 2022-08-05 | Payer: MEDICARE

## 2022-08-05 DIAGNOSIS — Z940 Kidney transplant status: Secondary | ICD-10-CM

## 2022-08-05 DIAGNOSIS — Z79899 Other long term (current) drug therapy: Secondary | ICD-10-CM

## 2022-08-05 DIAGNOSIS — D849 Immunodeficiency, unspecified: Secondary | ICD-10-CM

## 2022-08-05 DIAGNOSIS — T8610 Unspecified complication of kidney transplant: Secondary | ICD-10-CM

## 2022-08-05 LAB — URIC ACID: URIC ACID: 5.4

## 2022-08-05 LAB — CBC AND DIFF
ABSOLUTE BASO COUNT: 0
ABSOLUTE EOS COUNT: 0
ABSOLUTE LYMPH COUNT: 1.6
ABSOLUTE MONO COUNT: 0.4
ABSOLUTE NEUTROPHIL: 4.3
BASOPHILS: 0.5
EOSINOPHIL %: 1.2
LYMPHOCYTES: 25
MCH: 28 mg/dL — ABNORMAL HIGH (ref 0.57–1.11)
MCHC: 31 — ABNORMAL LOW (ref >59)
MONOCYTES %: 6.8
MPV: 9.9
NEUTROPHILS %: 65
PLATELET COUNT: 311 mg/dL
RDW: 12
WBC COUNT: 6.6

## 2022-08-05 LAB — URINALYSIS MICROSCOPIC REFLEX TO CULTURE

## 2022-08-05 LAB — PROTEIN/CR RATIO,UR RAN
PROT CREAT RAT/CAL: 0 — AB (ref 0–5)
UR CREATININE, RAN: 194 — ABNORMAL HIGH (ref 47–110)
UR TOTAL PROTEIN,RAN: 17 — ABNORMAL HIGH (ref 1–14)

## 2022-08-05 LAB — COMPREHENSIVE METABOLIC PANEL: SODIUM: 139 mmol/L

## 2022-08-05 LAB — PHOSPHORUS: PHOSPHORUS: 3.7

## 2022-08-05 LAB — MAGNESIUM: MAGNESIUM: 1.5 — ABNORMAL LOW (ref 1.6–2.6)

## 2022-08-10 ENCOUNTER — Encounter: Admit: 2022-08-10 | Discharge: 2022-08-10 | Payer: MEDICARE

## 2022-08-10 DIAGNOSIS — Z940 Kidney transplant status: Secondary | ICD-10-CM

## 2022-08-10 DIAGNOSIS — D849 Immunodeficiency, unspecified: Secondary | ICD-10-CM

## 2022-08-10 DIAGNOSIS — Z79899 Other long term (current) drug therapy: Secondary | ICD-10-CM

## 2022-08-10 DIAGNOSIS — T8610 Unspecified complication of kidney transplant: Secondary | ICD-10-CM

## 2022-08-10 LAB — CMV QUANT PCR-BLOOD

## 2022-08-11 ENCOUNTER — Encounter: Admit: 2022-08-11 | Discharge: 2022-08-11 | Payer: MEDICARE

## 2022-08-11 ENCOUNTER — Ambulatory Visit: Admit: 2022-08-11 | Discharge: 2022-08-12 | Payer: MEDICARE

## 2022-08-11 DIAGNOSIS — K3184 Gastroparesis: Secondary | ICD-10-CM

## 2022-08-11 DIAGNOSIS — E109 Type 1 diabetes mellitus without complications: Secondary | ICD-10-CM

## 2022-08-11 DIAGNOSIS — T8610 Unspecified complication of kidney transplant: Secondary | ICD-10-CM

## 2022-08-11 DIAGNOSIS — E119 Type 2 diabetes mellitus without complications: Secondary | ICD-10-CM

## 2022-08-11 DIAGNOSIS — Z940 Kidney transplant status: Secondary | ICD-10-CM

## 2022-08-11 DIAGNOSIS — B348 Other viral infections of unspecified site: Secondary | ICD-10-CM

## 2022-08-11 DIAGNOSIS — I639 Cerebral infarction, unspecified: Secondary | ICD-10-CM

## 2022-08-11 DIAGNOSIS — D849 Immunodeficiency, unspecified: Secondary | ICD-10-CM

## 2022-08-11 DIAGNOSIS — R413 Other amnesia: Secondary | ICD-10-CM

## 2022-08-11 DIAGNOSIS — Z79899 Other long term (current) drug therapy: Secondary | ICD-10-CM

## 2022-08-11 DIAGNOSIS — N289 Disorder of kidney and ureter, unspecified: Secondary | ICD-10-CM

## 2022-08-11 DIAGNOSIS — I10 Essential (primary) hypertension: Secondary | ICD-10-CM

## 2022-08-11 LAB — BK VIRUS QUANT PLASMA

## 2022-08-11 NOTE — Progress Notes
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  Donor: DCD, IRD: Yes, Age: 20s, female, Ht 5'11. Wt 215 lbs. Creatinine Initial/Terminal. ?/1.8  KDPI: 47%, cPRA:  75%  Match: VXM Negative, PXM -ve  CMV: D+/R+  Induction: thymoglobulin  Maintenance Immunosuppression: Triple drug therapy  Post OP: good UOP post op    Referring Nephrologist:  Lorre Munroe  [address] [place] [address]  Phone: [phone]  Fax: [phone]     Dear Dr. Lorre Munroe,    We had the pleasure of meeting Brandy Joseph in the [facility] for routine evaluation of her renal transplant. Continues to have back issues- undergoing PT. Has trouble remembering things. Takes meds that are on the list. BP are within normal limits. Urination is well, denies dysuria, hematuria.  Denies chest pain, shortness of breath, recent illnesses or surgeries.  Compliant with medications, blood pressure has been running normal.  Blood sugars have been within normal limits, continues to follow with endocrine.    REVIEW OF SYSTEMS: Comprehensive 14-point ROS reviewed  Positives noted in HPI otherwise negative.    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    Diabetes mellitus type 1 (HCC) 1984    DM (diabetes mellitus) (HCC)     Gastroparesis     Hypertension     Kidney disease     Memory loss     Stroke [facility])        Surgical History:   Procedure Laterality Date    ANKLE SURGERY Left 1996    FIBULA FRACTURE SURGERY Left 1996    ESOPHAGOGASTRODUODENOSCOPY WITH BIOPSY - FLEXIBLE N/A 03/29/2018    Performed by Virgina Organ, MD at [facility] ENDO    ESOPHAGOGASTRODUODENOSCOPY WITH BIOPSY - FLEXIBLE N/A 10/04/2019    Performed by Everardo All, MD at [facility] ENDO    COLONOSCOPY WITH BIOPSY - FLEXIBLE N/A 10/04/2019 Performed by Everardo All, MD at [facility] ENDO    ANGIOGRAPHY CORONARY ARTERY WITH LEFT HEART CATHETERIZATION N/A 01/18/2020    Performed by Reola Mosher, MD at [facility] CATH LAB    POSSIBLE PERCUTANEOUS CORONARY STENT PLACEMENT WITH ANGIOPLASTY N/A 01/18/2020    Performed by Reola Mosher, MD at [facility] CATH LAB    COLONOSCOPY WITH BIOPSY - FLEXIBLE N/A 05/15/2020    Performed by Eliott Nine, MD at [facility] ENDO    COLONOSCOPY WITH SNARE REMOVAL TUMOR/ POLYP/ OTHER LESION  05/15/2020    Performed by Eliott Nine, MD at [facility] ENDO    ALLOTRANSPLANTATION KIDNEY FROM NON LIVING DONOR WITHOUT RECIPIENT NEPHRECTOMY N/A 05/07/2021    Performed by Nicholos Johns, MD at [facility] OR    BURR HOLE      release pressure from stroke    DIALYSIS FISTULA CREATION      GALLBLADDER SURGERY      HX CESAREAN SECTION      HX EYE SURGERY  03/2020    Retina bleed    KIDNEY SURGERY      KIDNEY TRANSPLANT         Social History     Socioeconomic History    Marital status: Divorced    Number of children: 1    Highest education level:  Associate degree: occupational, technical, or vocational program   Tobacco Use    Smoking status: Never    Smokeless tobacco: Never   Vaping Use    Vaping Use: Never used   Substance and Sexual Activity    Alcohol use: Yes     Comment: occasional glass of wine    Drug use: Never    Sexual activity: Yes     Partners: Male     Birth control/protection: Surgical       Family History   Problem Relation Age of Onset    Stroke Mother     Diabetes Paternal Grandmother     Heart problem Paternal Grandmother     Hypertension Paternal Grandmother     Tumor Maternal Grandmother         brain    Cancer Maternal Grandmother         Brain tumor    None Reported Son        Allergies   Allergen Reactions    Ketorolac RASH    Sulfa (Sulfonamide Antibiotics) EDEMA     Full body swelling; eye swelling; itchiness       Current Medications:    Current Outpatient Medications:     aspirin EC (ASPIR-LOW) 81 mg tablet, Take one tablet by mouth daily. Take with food., Disp: 90 tablet, Rfl:     Biotin 5 mg cap, TAKE 1 CAPSULE BY MOUTH EVERY DAY, Disp: 90 capsule, Rfl: 2    buPROPion XL (WELLBUTRIN XL) 150 mg tablet, Take one tablet by mouth every morning. Do not crush or chew., Disp: , Rfl:     carvediloL (COREG) 25 mg tablet, TAKE 1 AND 1/2 TABLETS BY MOUTH TWICE DAILY WITH FOOD, Disp: 270 tablet, Rfl: 3    cetirizine (ZYRTEC) 10 mg tablet, Take one tablet by mouth daily., Disp: , Rfl:     CHOLEcalciferoL (vitamin D3) 50 mcg (2,000 unit) tablet, Take one tablet by mouth daily., Disp: 90 tablet, Rfl: 3    docusate (COLACE) 100 mg capsule, Take one capsule by mouth twice daily., Disp: 180 capsule, Rfl: 1    ferrous sulfate (FEROSUL) 325 mg (65 mg iron) tablet, Take one tablet by mouth daily., Disp: 90 tablet, Rfl: 3    insulin lispro(+) (HUMALOG U-100 INSULIN) 100 unit/mL injection, Maximum of 50 units daily. Via insulin pump. Use 1:6 insulin to carb ratio and the following correction factor Glucose 140-180 = 1 units Glucose 181-220 = 2 units Glucose 221-260 = 3 units Glucose 261-300 = 4 units Glucose 301-350 = 5 units Glucose 351-400 = 6 units Glucose >400 = 7 units, Disp: 3 vial, Rfl: 3    insulin pen needles (disposable) (BD UF NANO PEN NEEDLES) 32 gauge x 5/32 pen needle, Use one each as directed as Needed. Use with insulin injections., Disp: 300 each, Rfl: 0    Insulin Syringe-Needle U-100 (BD INSULIN SYRINGE ULTRA-FINE) 1 mL 31 gauge x 5/16 syrg, Use with insulin vials as directed, Disp: 100 each, Rfl: 1    lisinopril (ZESTRIL) 5 mg tablet, Take one tablet by mouth at bedtime daily. Indications: high blood pressure, Disp: 30 tablet, Rfl: 2    magnesium oxide (MAGOX) 400 mg (241.3 mg magnesium) tablet, Take two tablets by mouth daily., Disp: 30 tablet, Rfl: 5    mycophenolate sodium (MYFORTIC) 180 mg tablet,delayed release, Take one tablet by mouth twice daily., Disp: 180 tablet, Rfl: 3    pantoprazole DR (PROTONIX) 40 mg tablet, Take one tablet by mouth  daily., Disp: 30 tablet, Rfl: 1    predniSONE (DELTASONE) 5 mg tablet, Take one tablet by mouth daily., Disp: 90 tablet, Rfl: 3    rosuvastatin (CRESTOR) 20 mg tablet, Take one-half tablet by mouth daily., Disp: 15 tablet, Rfl: 1    tacrolimus (PROGRAF) 1 mg capsule, TAKE 3 CAPSULES BY MOUTH TWICE DAILY, Disp: 540 capsule, Rfl: 2    tamsulosin (FLOMAX) 0.4 mg capsule, Take one capsule by mouth daily after breakfast. Do not crush, chew or open capsules. Take 30 minutes following the same meal each day., Disp: 30 capsule, Rfl: 1    Physical Exam:  There were no vitals filed for this visit.  There is no height or weight on file to calculate BMI.  General: NAD, A+Ox4, calm and pleasant. Appears to be stated age. Walker    Laboratory studies:     CMP:      Latest Ref Rng & Units 08/05/2022    10:54 AM 07/08/2022    10:41 AM 06/15/2022    11:03 AM 05/27/2022    10:50 AM 05/06/2022    11:15 AM   CMP   Sodium mmol/L 139  137  137  137  139    Potassium  4.2  4.6  4.4  4.6  4.5    Chloride  107  107  107  108  109    CO2  22.0  21.0  23.0  21.0  20.0    Anion Gap  10  9  7  8  10     Blood Urea Nitrogen 7.0 - 18.7 19.0  20.0  26.0  20.0  27.0    Creatinine 0.57 - 1.11 mg/dL 1.61  0.96  0.45  4.09  1.30    Calcium  9.8  9.7  9.9  9.3  9.6    Total Protein  7.2  7.5  7.7  7.1  7.4    Albumin  3.8  4.0  4.1  3.8  3.9    Alk Phosphatase  49  61  60  48  59    ALT (SGPT)  6  10  14  16  15     Total Bilirubin mg/dl 8.11  9.14  7.82  9.56  0.39      Hemoglobin A1C   Date Value   04/08/2022 7.7 % (H)   01/21/2022 7.9   07/30/2021 8.3 % (H)     PTH (no units)   Date Value   01/21/2022 51     PTH Hormone (PG/ML)   Date Value   04/08/2022 112.2 (H)   07/30/2021 54.6     Lab Results   Component Value Date    LIPASE 5 (L) 05/16/2021    LIPASE 31 10/01/2019     No results found for: AMY  BK Virus Plasma Quant (no units)   Date Value   05/27/2022 NOT DETECTED   03/18/2022 Not Detected   02/18/2022 Not Detected   12/27/2021 Not Detected 11/13/2021 Not Detected     CMV DNA Quant PCR   Date Value   08/05/2022 NOT DETECTED   07/08/2022 NOT DETECTED Log IU/mL   06/15/2022 NOT DETECTED   05/27/2022 NOT DETECTED   05/06/2022 NOT DETECTED Log IU/mL     IU/mL CMV Blood (no units)   Date Value   10/29/2021     <50 IU/mL  The test method detects and quantitates CMV DNA using the Abbott RealTime assay,   and is approved by the FDA  for monitoring hematopoietic stem cell transplant   patients who are undergoing anti-CMV therapy.  Please correlate results with the   clinical status of the patient.     10/01/2021     <50 IU/mL  The test method detects and quantitates CMV DNA using the Abbott RealTime assay,   and is approved by the FDA for monitoring hematopoietic stem cell transplant   patients who are undergoing anti-CMV therapy.  Please correlate results with the   clinical status of the patient.     08/05/2021     <50 IU/mL  The test method detects and quantitates CMV DNA using the Abbott RealTime assay,   and is approved by the FDA for monitoring hematopoietic stem cell transplant   patients who are undergoing anti-CMV therapy.  Please correlate results with the   clinical status of the patient.     07/30/2021     <50 IU/mL  The test method detects and quantitates CMV DNA using the Abbott RealTime assay,   and is approved by the FDA for monitoring hematopoietic stem cell transplant   patients who are undergoing anti-CMV therapy.  Please correlate results with the   clinical status of the patient.     07/09/2021     <50 IU/mL  The test method detects and quantitates CMV DNA using the Abbott RealTime assay,   and is approved by the FDA for monitoring hematopoietic stem cell transplant   patients who are undergoing anti-CMV therapy.  Please correlate results with the   clinical status of the patient.       BK Virus Plasma Copies/ML (no units)   Date Value   08/05/2022 NOT DETECTED   07/08/2022 NOT DETECTED   06/15/2022 Not Detected   05/06/2022 Not Detected 04/15/2022 Not Detected     No results found for: EBVDNAQT    TACROLIMUS LEVEL:  Tacrolimus Immunoassay (ng/mL)   Date Value   04/08/2022 9.7   10/29/2021 12.8   10/01/2021 11.2   08/06/2021 14.2   07/30/2021 8.4       CBC with Diff:      Latest Ref Rng & Units 08/05/2022    10:54 AM 07/08/2022    10:41 AM   CBC with Diff   White Blood Cells  6.63  6.93    RBC 3.93 - 5.22 3.72  3.62    Hemoglobin 11.2 - 15.7 10.6  10.6    Hematocrit 34.1 - 44.9 33.7  33.3    MCV  90.6  92.0    MCH  28.5  29.3    MCHC 32.2 - 35.5 31.5  31.8    RDW  12.8  12.3    Platelet Count  311  274    MPV  9.9  10.2    Neutrophils  65.7  61.8    Absolute Neutrophil Count  4.36  4.29    Absolute Lymph Count  1.69  1.82    Absolute Monocyte Count  0.45  0.68    Absolute Eosinophil Count  0.08  0.08    Absolute Basophil Count  0.03  0.04      Lab Results   Component Value Date/Time    IRON 70 04/08/2022 10:49 AM    TIBC 295 04/08/2022 10:49 AM    PSAT 24 (L) 04/08/2022 10:49 AM    FERRITIN 123 04/08/2022 10:49 AM    FERRITIN 254.32 (H) 01/21/2022 10:00 AM       Urinalysis:  Lab Results   Component Value  Date/Time    UCOLOR Yellow 08/05/2022 10:54 AM    TURBID Clear 08/05/2022 10:54 AM    USPGR 1.015 08/05/2022 10:54 AM    UPH 7.0 08/05/2022 10:54 AM    UPROTEIN Negative 08/05/2022 10:54 AM    UAGLU Negative 08/05/2022 10:54 AM    UKET Trace (A) 08/05/2022 10:54 AM    UBILE Negative 08/05/2022 10:54 AM    UBLD Negative 08/05/2022 10:54 AM    UROB 0.2 08/05/2022 10:54 AM     Protein/CR ratio   Date Value   08/05/2022 0.09161   07/08/2022 0.98119   06/15/2022 1.47829   05/27/2022 5.62130   05/06/2022 8.65784 ML       Imaging:  Results for orders placed during the hospital encounter of 08/05/21    CTA CHEST PULM EMBOLISM W/CONT    Impression  1.  Limited opacification of subsegmental pulmonary arteries without imaging features suggestive of acute pulmonary embolism. No evidence of right heart strain.  2.  Several unchanged scattered bilateral pulmonary nodules, likely scarring/granulomas. Dedicated follow-up is not indicated in the absence of known malignancy.  3.  Nonobstructing left upper pole renal calculus.    By my electronic signature, I attest that I have personally reviewed the images for this examination and formulated the interpretations and opinions expressed in this report      Finalized by Dimple Casey, M.D. on 08/05/2021 8:41 PM. Dictated by Dion Body, M.D. on 08/05/2021 7:58 PM.    Results for orders placed during the hospital encounter of 08/05/21    ABDOMEN 1 VIEW    Impression  Findings/Impression:    2 supine frontal x-ray images of the abdomen/pelvis were obtained. No dilated bowel loops are visualized. Prior cholecystectomy. Mass-effect in the right pelvis region is present due to the known right pelvis renal transplant. There are several tiny foci of calcification projected over the pelvis that most likely represent phleboliths. Calcific arteriosclerosis.      Finalized by Rosilyn Mings, M.D. on 08/05/2021 10:44 AM. Dictated by Rosilyn Mings, M.D. on 08/05/2021 10:42 AM.    Assessment and Plan:  Brandy Joseph is a 42 y.o. 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   - the patient has Hx of recurrent UTI and Hx of neurogenic bladder.     #) BPs:   - WNL      #) Diabetes  -  BGs have been labile on BMPs  - on insulin pump  - HgA1C 8.3% today  - follows with Endocrinology     #) Hx of CVA   - with residual memory loss and RLE weakness.  - start ASA/ cont Rosuvastatin     #) Anemia - Hgb average of 10-12s  - at goal, monitor   - Iron studies: Iron sufficient on 05/09/2021     #) CKD-MBD  - PTH 396 on 05/07/2021  - Vitamin D 16.5 on 11/2 - Completed 25-Vit.D 50 000 unit weekly. Start 2000 international unit(s) once daily  - Ca acceptable    #) Memory issues  - unable to remember meds, timings etc  - uses phone alarm    #) Anxiety/depression  - Cont Wellbutrin XL    #) General health maint. Managed by PCP   Skin cancer surveillance: Annual follow up with Dermatology for skin eval due to long term immunosuppression   Cancer screening: Mammogram, Pap Smear, colonoscopy per guidelines   Annual high dose flu vaccine    Avoid all live vaccines. When clinically indicated or age appropriate, can receive the Shingrix vaccine one year post transplant.      Vaccinated for COVID-19 [Moderna] x 4    Sincerely,    Corena Herter, MD  Transplant Nephrology    Cc: Thomas Alderson  Cc: Christene Lye Darland    Please contact the [facility] at [phone] for any transplant related questions or concerns that may arise.

## 2022-08-11 NOTE — Progress Notes
Obtained patient's verbal consent to treat them and their agreement to [facility] financial policy and NPP via this telehealth visit during the Coronavirus Public Health Emergency

## 2022-08-11 NOTE — Progress Notes
Tac not resulted from 1/31    Needs to re-establish with Dr. Denna Haggard

## 2022-08-11 NOTE — Patient Instructions
RTC in 3 months    Labs monthly

## 2022-08-19 ENCOUNTER — Encounter: Admit: 2022-08-19 | Discharge: 2022-08-19 | Payer: MEDICARE

## 2022-08-19 DIAGNOSIS — D849 Immunodeficiency, unspecified: Secondary | ICD-10-CM

## 2022-08-19 DIAGNOSIS — Z79899 Other long term (current) drug therapy: Secondary | ICD-10-CM

## 2022-08-19 DIAGNOSIS — T8610 Unspecified complication of kidney transplant: Secondary | ICD-10-CM

## 2022-08-19 DIAGNOSIS — Z940 Kidney transplant status: Secondary | ICD-10-CM

## 2022-08-19 NOTE — Telephone Encounter
[facility] called behalf of pt to get lab orders because the prior orders expired, transferred to [facility].

## 2022-08-20 ENCOUNTER — Encounter: Admit: 2022-08-20 | Discharge: 2022-08-20 | Payer: MEDICARE

## 2022-08-20 DIAGNOSIS — Z940 Kidney transplant status: Secondary | ICD-10-CM

## 2022-08-20 DIAGNOSIS — Z79899 Other long term (current) drug therapy: Secondary | ICD-10-CM

## 2022-08-20 DIAGNOSIS — D849 Immunodeficiency, unspecified: Secondary | ICD-10-CM

## 2022-08-20 LAB — URIC ACID: URIC ACID: 6.3 mg/dL — ABNORMAL HIGH (ref 2.6–6.0)

## 2022-08-20 LAB — CBC AND DIFF
ABSOLUTE BASO COUNT: 0 uL (ref 0.01–0.08)
ABSOLUTE EOS COUNT: 0.1 uL (ref 0.04–0.54)
ABSOLUTE MONO COUNT: 0.4 uL (ref 0.24–0.86)
BASOPHILS: 0.5 % (ref 0.1–1.12)
HEMATOCRIT: 34 % — ABNORMAL LOW (ref 34.1–44.9)
HEMOGLOBIN: 11 g/dL — ABNORMAL LOW (ref 11.2–16.7)
MCV: 89 mg/dL — ABNORMAL HIGH (ref 79.4–94.8)
PLATELET COUNT: 286 uL (ref 182–369)
RBC COUNT: 3.7 uL — ABNORMAL LOW (ref 3.93–5.22)

## 2022-08-20 LAB — URINALYSIS DIPSTICK REFLEX TO CULTURE
GLUCOSE,UA: NEGATIVE % (ref 34.0–71.1)
LEUKOCYTES: NEGATIVE uL (ref 1.18–3.74)
NITRITE: NEGATIVE % (ref 0.7–7.0)
PROTEIN,UA: NEGATIVE mg/dL (ref 9.4–12.4)
URINE KETONE: NEGATIVE U/L (ref 0–55)
URINE PH: 6 — ABNORMAL HIGH (ref 0.0–7.0)
UROBILINOGEN: 0.2 uL (ref 1.56–6.13)

## 2022-08-20 LAB — PROTEIN/CR RATIO,UR RAN
PROT CREAT RAT/CAL: 0 (ref <0.2)
UR CREATININE, RAN: 162 mg/dL — ABNORMAL HIGH (ref 47–110)
UR TOTAL PROTEIN,RAN: 12 mg/dL (ref 1–14)

## 2022-08-20 LAB — COMPREHENSIVE METABOLIC PANEL
ALK PHOSPHATASE: 58 U/L (ref 40–150)
CREATININE: 1.7 mg/dL — ABNORMAL HIGH (ref 0.57–1.11)
POTASSIUM: 4.3 mmol/L (ref 3.98–10.04)
SODIUM: 137 mmol/L (ref 136–145)

## 2022-08-20 LAB — URINALYSIS MICROSCOPIC REFLEX TO CULTURE

## 2022-08-20 LAB — PHOSPHORUS: PHOSPHORUS: 3.1 mg/dL (ref 2.3–4.7)

## 2022-08-24 ENCOUNTER — Encounter: Admit: 2022-08-24 | Discharge: 2022-08-24 | Payer: MEDICARE

## 2022-08-24 DIAGNOSIS — Z79899 Other long term (current) drug therapy: Secondary | ICD-10-CM

## 2022-08-24 DIAGNOSIS — Z940 Kidney transplant status: Secondary | ICD-10-CM

## 2022-08-24 DIAGNOSIS — D849 Immunodeficiency, unspecified: Secondary | ICD-10-CM

## 2022-08-24 LAB — TACROLIMUS LC-MS/MS: TACROLIMUS LC-MS/MS: 14 ug/L (ref 5.0–20.0)

## 2022-08-24 LAB — CMV QUANT PCR-BLOOD

## 2022-08-24 LAB — BK VIRUS QUANT PLASMA

## 2022-08-24 MED ORDER — TACROLIMUS 1 MG PO CAP
2 mg | ORAL_CAPSULE | Freq: Two times a day (BID) | ORAL | 3 refills | 30.00000 days | Status: AC
Start: 2022-08-24 — End: ?
  Filled 2022-09-18: qty 360, 90d supply, fill #1

## 2022-08-24 NOTE — Telephone Encounter
Reviewed labs from 08/19/2022 at this time    Hgb 11.0  WBC 6.18  Creat 1.73    Mg 2.4  Phos 3.1   Tacrolimus  14.2 (Goal 5-8)  UPC 0.07  BK not detected  CMV not detected    Received the following orders from Dr. Lyndel Safe :  Decrease tacrolimus to 2/2 (from 3/3)    Reviewed labs with patient at this time via mychart   Medication list updated

## 2022-08-25 ENCOUNTER — Encounter: Admit: 2022-08-25 | Discharge: 2022-08-25 | Payer: MEDICARE

## 2022-08-26 ENCOUNTER — Encounter: Admit: 2022-08-26 | Discharge: 2022-08-26 | Payer: MEDICARE

## 2022-08-26 MED ORDER — PREDNISONE 5 MG PO TAB
5 mg | ORAL_TABLET | Freq: Every day | ORAL | 1 refills
Start: 2022-08-26 — End: ?

## 2022-08-26 MED ORDER — FEROSUL 325 MG (65 MG IRON) PO TAB
325 mg | ORAL_TABLET | Freq: Every day | ORAL | 2 refills
Start: 2022-08-26 — End: ?

## 2022-08-28 ENCOUNTER — Encounter: Admit: 2022-08-28 | Discharge: 2022-08-28 | Payer: MEDICARE

## 2022-08-28 NOTE — Progress Notes
42 yr old  Epigastric pain     Renal transplant from 9/22 at [facility] patient follows with transplant nephrology services at [facility].    Patient took 2 tums at home  GI cocktail in ED improved pain to 4-5/10--> 45 later pain back to 10/10 patient was hyperventilating  Protonix IV given  PO Ativan given  IV Fentanyl offered per patient need to speak to transplant team.  Giving 1L fluids    EKG NSR nothing acute rate 97    Labs:  Troponin normal repeate at 0230  Cr 1.83 baseline 1.2-1.7  BUN 27  Glucose 370 has insulin pump has been high on other encounters  K 5.3 no peaked T waves  CBC no concerns  Lipase 12  Ph 7.4 on VBG    Vitals:  BP 120/60--> now agitated 160/60 P 100 Sinus RR 15 SPO2 98% on RA T 97.9    No concerns by provider  Patient request per signed contract with transplant team. Has to have provider speak to transplant team prior to care. No concerns r/t transplant at this time.

## 2022-08-31 ENCOUNTER — Encounter: Admit: 2022-08-31 | Discharge: 2022-08-31 | Payer: MEDICARE

## 2022-09-02 ENCOUNTER — Encounter: Admit: 2022-09-02 | Discharge: 2022-09-02 | Payer: MEDICARE

## 2022-09-02 MED FILL — MYCOPHENOLATE SODIUM 180 MG PO TBEC: 180 mg | ORAL | 90 days supply | Qty: 180 | Fill #3 | Status: AC

## 2022-09-03 ENCOUNTER — Encounter: Admit: 2022-09-03 | Discharge: 2022-09-03 | Payer: MEDICARE

## 2022-09-03 NOTE — Telephone Encounter
Advised that allosure rep will be contacting her for home draw for this test.

## 2022-09-03 NOTE — Telephone Encounter
lena from [facility]  with pt at the lab stated that there is another test she needs to take and they do not have an order for it .  Transferred to [facility]

## 2022-09-04 ENCOUNTER — Encounter: Admit: 2022-09-04 | Discharge: 2022-09-04 | Payer: MEDICARE

## 2022-09-04 DIAGNOSIS — Z79899 Other long term (current) drug therapy: Secondary | ICD-10-CM

## 2022-09-04 DIAGNOSIS — D849 Immunodeficiency, unspecified: Secondary | ICD-10-CM

## 2022-09-04 DIAGNOSIS — Z940 Kidney transplant status: Secondary | ICD-10-CM

## 2022-09-04 LAB — CBC AND DIFF
ABSOLUTE EOS COUNT: 0.1 x10-3/uL (ref 0.04–0.54)
ABSOLUTE LYMPH COUNT: 1.9 x10-3/uL (ref 1.18–3.74)
ABSOLUTE MONO COUNT: 0.6 x10-3/uL (ref 0.24–0.86)
ABSOLUTE NEUTROPHIL: 4.5 /uL (ref 1.56–6.13)
BASOPHILS: 0.3 % (ref 0.1–1.2)
EOSINOPHIL %: 1.7 % (ref 0.7–7.0)
HEMATOCRIT: 32 % — ABNORMAL LOW (ref <0.2)
LYMPHOCYTES: 26 %
MCH: 28 pg
MCHC: 31 g/dL — ABNORMAL LOW (ref 32.2–35.5)
MCV: 92
MONOCYTES %: 8.6 %
MPV: 10 fL
NEUTROPHILS %: 63 %
PLATELET COUNT: 270 x10-3/uL (ref 182–369)
RDW: 13
WBC COUNT: 7.2

## 2022-09-04 LAB — URINALYSIS DIPSTICK REFLEX TO CULTURE
GLUCOSE,UA: NEGATIVE mg/dL (ref 8.4–10.2)
NITRITE: NEGATIVE U/L (ref 0–55)
URINE BILE: NEGATIVE U/L (ref 40–150)
URINE PH: 5.5 mg/dL — ABNORMAL HIGH (ref 5.0–7.0)
UROBILINOGEN: 0.2 g/dL (ref 6.4–8.3)

## 2022-09-04 LAB — COMPREHENSIVE METABOLIC PANEL
ALBUMIN: 3.9 g/dL (ref 3.5–5.0)
CHLORIDE: 106 mmol/L
CO2: 20 mmol/L — ABNORMAL LOW (ref 22–29)
POTASSIUM: 4.9 mmol/L
SODIUM: 136 mmol/L (ref 136–145)

## 2022-09-04 LAB — PHOSPHORUS: PHOSPHORUS: 3.8 (ref 2.3–4.7)

## 2022-09-04 LAB — URINALYSIS MICROSCOPIC REFLEX TO CULTURE

## 2022-09-04 LAB — PROTEIN/CR RATIO,UR RAN
UR CREATININE, RAN: 222 mg/dL — ABNORMAL HIGH (ref 47–110)
UR TOTAL PROTEIN,RAN: 12 mg/dL — ABNORMAL LOW (ref 1–14)

## 2022-09-04 LAB — MAGNESIUM: MAGNESIUM: 1.5 mg/dL — ABNORMAL LOW (ref 1.6–2.6)

## 2022-09-04 LAB — URIC ACID: URIC ACID: 6.3 mg/dL — ABNORMAL HIGH (ref 2.6–6.0)

## 2022-09-07 ENCOUNTER — Encounter: Admit: 2022-09-07 | Discharge: 2022-09-07 | Payer: MEDICARE

## 2022-09-07 DIAGNOSIS — Z940 Kidney transplant status: Secondary | ICD-10-CM

## 2022-09-07 DIAGNOSIS — Z79899 Other long term (current) drug therapy: Secondary | ICD-10-CM

## 2022-09-07 DIAGNOSIS — D849 Immunodeficiency, unspecified: Secondary | ICD-10-CM

## 2022-09-07 LAB — CMV QUANT PCR-BLOOD

## 2022-09-07 LAB — TACROLIMUS LC-MS/MS: TACROLIMUS LC-MS/MS: 6.9 ug/L (ref 5.0–20.0)

## 2022-09-07 LAB — BK VIRUS QUANT PLASMA

## 2022-09-08 ENCOUNTER — Encounter: Admit: 2022-09-08 | Discharge: 2022-09-08 | Payer: MEDICARE

## 2022-09-09 ENCOUNTER — Encounter: Admit: 2022-09-09 | Discharge: 2022-09-09 | Payer: MEDICARE

## 2022-09-09 NOTE — Telephone Encounter
Lab results from 09/03/22 reviewed.  Cr 1.46.   Tacrolimus (Prograf) level 6.9.  Goal range per Dr. Ahmed Prima note 5-10ng/ml.  No change in dosage needed.  Sent message to pt's MyChart regarding lab results.

## 2022-09-10 ENCOUNTER — Encounter: Admit: 2022-09-10 | Discharge: 2022-09-10 | Payer: MEDICARE

## 2022-09-12 ENCOUNTER — Encounter: Admit: 2022-09-12 | Discharge: 2022-09-12 | Payer: MEDICARE

## 2022-09-16 ENCOUNTER — Encounter: Admit: 2022-09-16 | Discharge: 2022-09-16 | Payer: MEDICARE

## 2022-09-18 ENCOUNTER — Encounter: Admit: 2022-09-18 | Discharge: 2022-09-18 | Payer: MEDICARE

## 2022-09-23 ENCOUNTER — Encounter: Admit: 2022-09-23 | Discharge: 2022-09-23 | Payer: MEDICARE

## 2022-09-23 DIAGNOSIS — Z79899 Other long term (current) drug therapy: Secondary | ICD-10-CM

## 2022-09-23 DIAGNOSIS — D849 Immunodeficiency, unspecified: Secondary | ICD-10-CM

## 2022-09-23 DIAGNOSIS — Z940 Kidney transplant status: Secondary | ICD-10-CM

## 2022-09-23 LAB — CBC AND DIFF
ABSOLUTE BASO COUNT: 0
ABSOLUTE EOS COUNT: 0.1
ABSOLUTE LYMPH COUNT: 2
ABSOLUTE MONO COUNT: 0.5
ABSOLUTE NEUTROPHIL: 3.6
BASOPHILS: 0.6
EOSINOPHIL %: 2.2
HEMATOCRIT: 31 — ABNORMAL LOW (ref 34.1–44.9)
HEMOGLOBIN: 9.7 — ABNORMAL LOW (ref 11.2–15.7)
LYMPHOCYTES: 31
MCH: 28
MCHC: 31 — ABNORMAL LOW (ref 32.2–35.5)
MCV: 90
MONOCYTES %: 8.5
MPV: 9.9
NEUTROPHILS %: 56
PLATELET COUNT: 343
RBC COUNT: 3.4 — ABNORMAL LOW (ref 3.93–5.22)
RDW: 14
WBC COUNT: 6.4

## 2022-09-24 ENCOUNTER — Encounter: Admit: 2022-09-24 | Discharge: 2022-09-24 | Payer: MEDICARE

## 2022-09-24 DIAGNOSIS — D849 Immunodeficiency, unspecified: Secondary | ICD-10-CM

## 2022-09-24 DIAGNOSIS — Z79899 Other long term (current) drug therapy: Secondary | ICD-10-CM

## 2022-09-24 DIAGNOSIS — Z940 Kidney transplant status: Secondary | ICD-10-CM

## 2022-09-24 LAB — PHOSPHORUS: PHOSPHORUS: 3.3

## 2022-09-24 LAB — COMPREHENSIVE METABOLIC PANEL
ANION GAP: 9 meq/L (ref 8–18)
BLD UREA NITROGEN: 31 mg/dL — ABNORMAL HIGH (ref 7.0–18.7)
CHLORIDE: 104 mmol/L
CO2: 24 mmol/L (ref 22–29)
POTASSIUM: 4.2 mmol/L
SODIUM: 137 mmol/L (ref 136–145)

## 2022-09-24 LAB — URINALYSIS DIPSTICK REFLEX TO CULTURE
URINE BILE: NEGATIVE g/dL
URINE PH: 6 — ABNORMAL LOW (ref 5.0–7.0)
URINE SPEC GRAVITY: 1 mg/dL (ref 1.002–1.030)
UROBILINOGEN: 0.2

## 2022-09-24 LAB — PROTEIN/CR RATIO,UR RAN
UR CREATININE, RAN: 74 mg/dL — AB (ref 47–110)
UR TOTAL PROTEIN,RAN: 7.5 mg/dL — AB (ref 1–14)

## 2022-09-24 LAB — MAGNESIUM: MAGNESIUM: 1.8 mg/dL (ref <0.2)

## 2022-09-24 LAB — URINALYSIS MICROSCOPIC REFLEX TO CULTURE

## 2022-09-24 LAB — URIC ACID: URIC ACID: 5.5

## 2022-09-24 MED ORDER — CARVEDILOL 25 MG PO TAB
ORAL_TABLET | ORAL | 2 refills | 90.00000 days | Status: AC
Start: 2022-09-24 — End: ?

## 2022-09-28 ENCOUNTER — Encounter: Admit: 2022-09-28 | Discharge: 2022-09-28 | Payer: MEDICARE

## 2022-09-29 ENCOUNTER — Encounter: Admit: 2022-09-29 | Discharge: 2022-09-29 | Payer: MEDICARE

## 2022-09-29 DIAGNOSIS — Z940 Kidney transplant status: Secondary | ICD-10-CM

## 2022-09-29 DIAGNOSIS — D849 Immunodeficiency, unspecified: Secondary | ICD-10-CM

## 2022-09-29 DIAGNOSIS — Z79899 Other long term (current) drug therapy: Secondary | ICD-10-CM

## 2022-09-29 LAB — TACROLIMUS LC-MS/MS: TACROLIMUS LC-MS/MS: 3.6 — ABNORMAL LOW (ref 5.0–20.0)

## 2022-09-29 LAB — CMV QUANT PCR-BLOOD

## 2022-09-30 ENCOUNTER — Encounter: Admit: 2022-09-30 | Discharge: 2022-09-30 | Payer: MEDICARE

## 2022-09-30 ENCOUNTER — Ambulatory Visit: Admit: 2022-09-30 | Discharge: 2022-09-30 | Payer: MEDICARE

## 2022-09-30 DIAGNOSIS — Z79899 Other long term (current) drug therapy: Secondary | ICD-10-CM

## 2022-09-30 DIAGNOSIS — Z940 Kidney transplant status: Secondary | ICD-10-CM

## 2022-09-30 DIAGNOSIS — D849 Immunodeficiency, unspecified: Secondary | ICD-10-CM

## 2022-09-30 MED ORDER — SODIUM CHLORIDE 0.9 % IJ SOLN
10 mL | Freq: Once | INTRAVENOUS | 0 refills | Status: CP
Start: 2022-09-30 — End: ?

## 2022-09-30 MED ORDER — PERFLUTREN LIPID MICROSPHERES 1.1 MG/ML IV SUSP
1-10 mL | Freq: Once | INTRAVENOUS | 0 refills | Status: CP | PRN
Start: 2022-09-30 — End: ?

## 2022-10-01 ENCOUNTER — Encounter: Admit: 2022-10-01 | Discharge: 2022-10-01 | Payer: MEDICARE

## 2022-10-05 ENCOUNTER — Encounter: Admit: 2022-10-05 | Discharge: 2022-10-05 | Payer: MEDICARE

## 2022-10-05 DIAGNOSIS — Z79899 Other long term (current) drug therapy: Secondary | ICD-10-CM

## 2022-10-05 DIAGNOSIS — Z940 Kidney transplant status: Secondary | ICD-10-CM

## 2022-10-05 DIAGNOSIS — D849 Immunodeficiency, unspecified: Secondary | ICD-10-CM

## 2022-10-05 LAB — BK VIRUS QUANT PLASMA

## 2022-10-08 ENCOUNTER — Encounter: Admit: 2022-10-08 | Discharge: 2022-10-08 | Payer: MEDICARE

## 2022-10-14 ENCOUNTER — Encounter: Admit: 2022-10-14 | Discharge: 2022-10-14 | Payer: MEDICARE

## 2022-10-14 DIAGNOSIS — Z79899 Other long term (current) drug therapy: Secondary | ICD-10-CM

## 2022-10-14 DIAGNOSIS — Z940 Kidney transplant status: Secondary | ICD-10-CM

## 2022-10-14 DIAGNOSIS — D849 Immunodeficiency, unspecified: Secondary | ICD-10-CM

## 2022-10-14 LAB — COMPREHENSIVE METABOLIC PANEL
ALBUMIN: 3.8 g/dL
ALK PHOSPHATASE: 57 U/L (ref 40–150)
ALT: 15 U/L (ref 0–55)
ANION GAP: 9 meq/L (ref 8–18)
AST: 12 (ref 5–34)
BLD UREA NITROGEN: 22 mg/dL — ABNORMAL HIGH (ref 7.0–18.7)
CALCIUM: 9.2 mg/dL
CHLORIDE: 107 mmol/L
CO2: 24 (ref 22–29)
CREATININE: 1.3 mg/dL — ABNORMAL HIGH (ref 0.57–1.11)
EGFR: 51 mL/Min/1.73 — ABNORMAL LOW (ref >59)
GLUCOSE,RANDOM: 131 — ABNORMAL HIGH (ref 70–105)
POTASSIUM: 4.5 mmol/L
SODIUM: 140 mmol/L (ref 136–145)
TOTAL BILIRUBIN: 0.4 mg/dL
TOTAL PROTEIN: 6.5

## 2022-10-14 LAB — CBC AND DIFF
BASOPHILS: 0.3 % (ref 0.1–1.12)
EOSINOPHIL %: 1.7 % (ref 0.7–7.0)
HEMATOCRIT: 29 % — ABNORMAL LOW (ref 34.1–44.9)
LYMPHOCYTES: 25 % (ref 19.3–53.1)
MCH: 28 pg (ref 25.6–32.2)
MCHC: 30 g/dL — ABNORMAL LOW (ref 32.2–35.5)
MCV: 91 fL (ref <6.01)
MONOCYTES %: 7.6 % (ref 30–80)
MPV: 9.8 fL (ref 9.4–12)
NEUTROPHILS %: 64 % (ref 34.0–71.1)
PLATELET COUNT: 327 uL (ref 182–369)
RBC COUNT: 3.1 uL — ABNORMAL LOW (ref 3.93–5.22)
RDW: 14
WBC COUNT: 6.9 uL (ref 3.98–10.04)

## 2022-10-14 LAB — PHOSPHORUS: PHOSPHORUS: 3.5

## 2022-10-14 LAB — MAGNESIUM: MAGNESIUM: 1.7 mg/dL

## 2022-10-14 LAB — URIC ACID: URIC ACID: 5.6 MMOL/L (ref 2.6–6.0)

## 2022-10-15 ENCOUNTER — Encounter: Admit: 2022-10-15 | Discharge: 2022-10-15 | Payer: MEDICARE

## 2022-10-15 NOTE — Telephone Encounter
Pt called to schedule a renal biopsy. Please call her back.

## 2022-10-16 ENCOUNTER — Encounter: Admit: 2022-10-16 | Discharge: 2022-10-16 | Payer: MEDICARE

## 2022-10-16 DIAGNOSIS — Z79899 Other long term (current) drug therapy: Secondary | ICD-10-CM

## 2022-10-16 DIAGNOSIS — Z940 Kidney transplant status: Secondary | ICD-10-CM

## 2022-10-16 DIAGNOSIS — D849 Immunodeficiency, unspecified: Secondary | ICD-10-CM

## 2022-10-16 LAB — URINALYSIS DIPSTICK REFLEX TO CULTURE
LEUKOCYTES: NEGATIVE
NITRITE: NEGATIVE
PROTEIN,UA: NEGATIVE
URINE BILE: NEGATIVE
URINE PH: 7 (ref 5.0–7.0)
URINE SPEC GRAVITY: 1 (ref 1.002–1.030)
UROBILINOGEN: 0.2

## 2022-10-16 LAB — PROTEIN/CR RATIO,UR RAN
PROT CREAT RAT/CAL: 0.1 (ref <0.2)
UR CREATININE, RAN: 46 mg/dL — ABNORMAL LOW (ref 47–110)
UR TOTAL PROTEIN,RAN: 7.5 mg/dL (ref 1–14)

## 2022-10-16 LAB — URINALYSIS MICROSCOPIC REFLEX TO CULTURE

## 2022-10-19 ENCOUNTER — Encounter: Admit: 2022-10-19 | Discharge: 2022-10-19 | Payer: MEDICARE

## 2022-10-19 DIAGNOSIS — D849 Immunodeficiency, unspecified: Secondary | ICD-10-CM

## 2022-10-19 DIAGNOSIS — Z940 Kidney transplant status: Secondary | ICD-10-CM

## 2022-10-19 DIAGNOSIS — Z79899 Other long term (current) drug therapy: Secondary | ICD-10-CM

## 2022-10-19 LAB — CMV QUANT PCR-BLOOD

## 2022-10-19 LAB — TACROLIMUS LC-MS/MS: TACROLIMUS LC-MS/MS: 9.3 ug/L (ref 5.0–20.0)

## 2022-10-19 LAB — PROTEIN/CR RATIO,UR RAN
PROT CREAT RAT/CAL: 0.1 mg/mg (ref <0.2)
UR CREATININE, RAN: 46 mg/dL — ABNORMAL LOW (ref >60)
UR TOTAL PROTEIN,RAN: 7.5 mg/dL (ref 1–14)

## 2022-10-20 ENCOUNTER — Encounter: Admit: 2022-10-20 | Discharge: 2022-10-20 | Payer: MEDICARE

## 2022-10-20 DIAGNOSIS — Z79899 Other long term (current) drug therapy: Secondary | ICD-10-CM

## 2022-10-20 DIAGNOSIS — D849 Immunodeficiency, unspecified: Secondary | ICD-10-CM

## 2022-10-20 DIAGNOSIS — Z940 Kidney transplant status: Secondary | ICD-10-CM

## 2022-10-22 ENCOUNTER — Encounter: Admit: 2022-10-22 | Discharge: 2022-10-22 | Payer: MEDICARE

## 2022-10-23 ENCOUNTER — Encounter: Admit: 2022-10-23 | Discharge: 2022-10-23 | Payer: MEDICARE

## 2022-10-23 NOTE — Telephone Encounter
Received after hours page from pt.  Contacted her @ number in page [phone]. Per pt, she started using Nystatin topical cream today for newly diag ring worm but wanted to make sure it was safe with her post transplant status.   Educated that Nystatin topical is safe to use as prescribed. Answered general questions re: ring worm infection.   Pt v/u of all info.

## 2022-11-03 ENCOUNTER — Encounter: Admit: 2022-11-03 | Discharge: 2022-11-03 | Payer: MEDICARE

## 2022-11-05 ENCOUNTER — Encounter: Admit: 2022-11-05 | Discharge: 2022-11-05 | Payer: MEDICARE

## 2022-11-05 DIAGNOSIS — D849 Immunodeficiency, unspecified: Secondary | ICD-10-CM

## 2022-11-05 DIAGNOSIS — Z940 Kidney transplant status: Secondary | ICD-10-CM

## 2022-11-05 DIAGNOSIS — Z79899 Other long term (current) drug therapy: Secondary | ICD-10-CM

## 2022-11-05 LAB — CBC AND DIFF
ABSOLUTE EOS COUNT: 0 x10-3/uL (ref 0.04–0.54)
ABSOLUTE LYMPH COUNT: 1.8 x10-3/uL (ref 1.18–3.74)
ABSOLUTE MONO COUNT: 0.6 /uL (ref 0.24–0.86)
ABSOLUTE NEUTROPHIL: 4.4 x10-3/uL (ref 1.56–6.13)
BASOPHILS: 0.3 % (ref 0.1–1.2)
EOSINOPHIL %: 1.3 % (ref 0.7–7.0)
HEMATOCRIT: 33 % — ABNORMAL LOW (ref 34.1–44.9)
HEMOGLOBIN: 10 g/dL — ABNORMAL LOW (ref 11.2–15.7)
LYMPHOCYTES: 25 % (ref 19.3–53.1)
MCH: 28 pg (ref 25.6–32.2)
MCHC: 31 — ABNORMAL LOW (ref 32.2–35.5)
MCV: 90
MONOCYTES %: 8.5 % (ref 4.7–12.5)
MPV: 9.9 fL (ref 9.4–12.4)
NEUTROPHILS %: 63 % (ref 34.0–71.1)
PLATELET COUNT: 333 uL (ref 182–369)
RBC COUNT: 3.7 uL — ABNORMAL LOW (ref 3.93–5.22)
RDW: 13
WBC COUNT: 7 x10-3/uL (ref 3.98–10.04)

## 2022-11-05 LAB — COMPREHENSIVE METABOLIC PANEL
ALBUMIN: 4.4 g/dL
ALK PHOSPHATASE: 68
ALT: 20 U/L (ref 0–55)
ANION GAP: 11 meq/L
AST: 13 U/L (ref 5–34)
BLD UREA NITROGEN: 19 mg/dL — ABNORMAL HIGH (ref 7.0–18.7)
CALCIUM: 9.7 mg/dL (ref 8.4–10.2)
CHLORIDE: 105 (ref 98–107)
CO2: 24
CREATININE: 1.4 mg/dL — ABNORMAL HIGH (ref 0.57–1.11)
EGFR: 47 — ABNORMAL LOW (ref >59)
GLUCOSE,RANDOM: 82
POTASSIUM: 4.5 mmol/L (ref 3.5–5.1)
SODIUM: 140 mmol/L (ref 136–145)
TOTAL BILIRUBIN: 0.5 mg/dL (ref 0.2–1.2)
TOTAL PROTEIN: 7.7 g/dL (ref 6.4–8.3)

## 2022-11-05 LAB — PROTEIN/CR RATIO,UR RAN
PROT CREAT RAT/CAL: 0 mg/mg (ref <0.2)
UR CREATININE, RAN: 84 mg/dL — ABNORMAL HIGH (ref 47–110)
UR TOTAL PROTEIN,RAN: 7.5 mg/dL (ref 1–14)

## 2022-11-05 LAB — URIC ACID: URIC ACID: 5.9 mg/dL (ref 0–0.80)

## 2022-11-05 LAB — PHOSPHORUS: PHOSPHORUS: 4.3 g/dL — ABNORMAL HIGH (ref 6.0–8.0)

## 2022-11-05 LAB — MAGNESIUM: MAGNESIUM: 1.8 mg/dL — ABNORMAL LOW (ref 1.6–2.6)

## 2022-11-09 ENCOUNTER — Encounter: Admit: 2022-11-09 | Discharge: 2022-11-09 | Payer: MEDICARE

## 2022-11-09 DIAGNOSIS — Z79899 Other long term (current) drug therapy: Secondary | ICD-10-CM

## 2022-11-09 DIAGNOSIS — D849 Immunodeficiency, unspecified: Secondary | ICD-10-CM

## 2022-11-09 DIAGNOSIS — Z940 Kidney transplant status: Secondary | ICD-10-CM

## 2022-11-09 LAB — TACROLIMUS LC-MS/MS: TACROLIMUS LC-MS/MS: 9.6 ug/L (ref 5.0–20.0)

## 2022-11-09 LAB — CMV QUANT PCR-BLOOD

## 2022-11-10 ENCOUNTER — Encounter: Admit: 2022-11-10 | Discharge: 2022-11-10 | Payer: MEDICARE

## 2022-11-11 ENCOUNTER — Encounter: Admit: 2022-11-11 | Discharge: 2022-11-11 | Payer: MEDICARE

## 2022-11-12 ENCOUNTER — Encounter: Admit: 2022-11-12 | Discharge: 2022-11-12 | Payer: MEDICARE

## 2022-11-16 ENCOUNTER — Encounter: Admit: 2022-11-16 | Discharge: 2022-11-16 | Payer: MEDICARE

## 2022-11-17 ENCOUNTER — Encounter: Admit: 2022-11-17 | Discharge: 2022-11-17 | Payer: MEDICARE

## 2022-11-17 NOTE — Telephone Encounter
[facility] call they have the pt there now and the need a dose clarification on pt's myfortic. Transferred to [place].

## 2022-11-17 NOTE — Progress Notes
42yoF w/ h/o Renal Tplant in 2022 @ [facility]. Today had endometrial ablation procedure and is IP @ [facility]. RN that is caring for pt is calling requesting to consult with tplant team on current mycophenolate dosage. There is conflicting information from chart and pt caregiver. Would like to clarify with [facility] Team     Per chart review     H&P 11/10/22-IM @ [facility]---.>720mg  (4x180mg  tabs) BID    Caregiver of pt stating--> 540mg  (3x108mg  tab) BID

## 2022-11-17 NOTE — Telephone Encounter
Advised patient is on MPA 180 mg BID

## 2022-11-17 NOTE — Telephone Encounter
Brandy Joseph called needing doseage for [employer]

## 2022-11-25 ENCOUNTER — Encounter: Admit: 2022-11-25 | Discharge: 2022-11-25 | Payer: MEDICARE

## 2022-11-25 MED ORDER — BIOTIN 5 MG PO CAP
1 | ORAL_CAPSULE | Freq: Every day | ORAL | 1 refills | 30.00000 days | Status: AC
Start: 2022-11-25 — End: ?

## 2022-11-26 ENCOUNTER — Encounter: Admit: 2022-11-26 | Discharge: 2022-11-26 | Payer: MEDICARE

## 2022-11-26 DIAGNOSIS — D849 Immunodeficiency, unspecified: Secondary | ICD-10-CM

## 2022-11-26 DIAGNOSIS — Z940 Kidney transplant status: Secondary | ICD-10-CM

## 2022-11-26 DIAGNOSIS — Z79899 Other long term (current) drug therapy: Secondary | ICD-10-CM

## 2022-11-26 LAB — URINALYSIS DIPSTICK REFLEX TO CULTURE
GLUCOSE,UA: NEGATIVE
NITRITE: NEGATIVE
PROTEIN,UA: NEGATIVE
URINE BILE: NEGATIVE
URINE BLOOD: NEGATIVE
URINE PH: 6 (ref 5.0–7.0)
URINE SPEC GRAVITY: 1 (ref 1.002–1.030)
UROBILINOGEN: 0.2

## 2022-11-26 LAB — COMPREHENSIVE METABOLIC PANEL
ALBUMIN: 3.8 g/dL
ALK PHOSPHATASE: 64 U/L
ALT: 13 U/L (ref 0–55)
AST: 10 U/L (ref 5–34)
BLD UREA NITROGEN: 27 mg/dL — ABNORMAL HIGH (ref 7.0–18.7)
CALCIUM: 9.8 mg/dL (ref 8.4–10.2)
CREATININE: 1.4 mg/dL — ABNORMAL HIGH (ref 0.57–1)
EGFR: 47 mL/Min/1.73 — ABNORMAL LOW (ref >59)
GLUCOSE,RANDOM: 116 — ABNORMAL HIGH (ref 70–105)
SODIUM: 141 mmol/L (ref 136–145)
TOTAL BILIRUBIN: 0.2 mg/dL (ref 0.2–1.2)
TOTAL PROTEIN: 7.3 (ref 6.4–8.3)

## 2022-11-26 LAB — PROTEIN/CR RATIO,UR RAN
PROT CREAT RAT/CAL: 0.1 (ref <0.2)
UR CREATININE, RAN: 93 mg/dL (ref 47–110)
UR TOTAL PROTEIN,RAN: 13 mg/dL (ref 1–14)

## 2022-11-26 LAB — CBC AND DIFF
ABSOLUTE EOS COUNT: 0.1 /uL (ref 0.04–0.54)
ABSOLUTE LYMPH COUNT: 1.7 /uL (ref 1.18–3.74)
ABSOLUTE MONO COUNT: 0.8 /uL (ref 0.24–0.86)
ABSOLUTE NEUTROPHIL: 6.3 /uL — ABNORMAL HIGH (ref 1.56–6.13)
BASOPHILS: 0.2 % (ref 0.1–1.12)
EOSINOPHIL %: 1.6 % (ref <20.7)
HEMATOCRIT: 32 % — ABNORMAL LOW (ref 34.1–44.9)
HEMOGLOBIN: 10 g/dL — ABNORMAL LOW (ref 11.2–16.7)
LYMPHOCYTES: 19 % — ABNORMAL LOW (ref 19.3–53.1)
MCH: 27 pg (ref 25.6–32.2)
MCHC: 30 g/dL — ABNORMAL LOW (ref 32.2–35.5)
MCV: 90 fL — ABNORMAL LOW (ref 79.4–94.8)
MONOCYTES %: 8.9 % (ref 0–0.20)
MPV: 9.8 fL — ABNORMAL LOW (ref >60)
NEUTROPHILS %: 69 % (ref 34.0–71.1)
PLATELET COUNT: 325 uL (ref 182–369)
RBC COUNT: 3.5 uL — ABNORMAL LOW (ref 3.93–5.22)
RDW: 13 U/L (ref 7–56)
WBC COUNT: 9.1 uL — ABNORMAL HIGH (ref 3.98–10.04)

## 2022-11-26 LAB — URINALYSIS MICROSCOPIC REFLEX TO CULTURE

## 2022-11-26 LAB — PHOSPHORUS: PHOSPHORUS: 3.7 mg/dL (ref 2.3–4.7)

## 2022-11-26 LAB — URIC ACID: URIC ACID: 5.6 mg/dL (ref 2.6–6.0)

## 2022-11-26 LAB — MAGNESIUM: MAGNESIUM: 1.9 mg/dL (ref 1.6–2.6)

## 2022-11-27 ENCOUNTER — Encounter: Admit: 2022-11-27 | Discharge: 2022-11-27 | Payer: MEDICARE

## 2022-11-27 MED ORDER — CIPROFLOXACIN HCL 500 MG PO TAB
500 mg | ORAL_TABLET | Freq: Two times a day (BID) | ORAL | 0 refills | 10.00000 days | Status: AC
Start: 2022-11-27 — End: ?

## 2022-11-27 NOTE — Telephone Encounter
Received the following orders from Dr. Wayne Sever:    Cipro 500 mg BID x 7 days for UTI symptoms (sent to [facility])     Mychart message to patient to advise of new prescription

## 2022-11-30 ENCOUNTER — Encounter: Admit: 2022-11-30 | Discharge: 2022-11-30 | Payer: MEDICARE

## 2022-12-02 ENCOUNTER — Encounter: Admit: 2022-12-02 | Discharge: 2022-12-02 | Payer: MEDICARE

## 2022-12-08 ENCOUNTER — Encounter: Admit: 2022-12-08 | Discharge: 2022-12-08 | Payer: MEDICARE

## 2022-12-08 NOTE — Progress Notes
Contacted Dena Renee Abbruzzese to refill their medication(s) TACROLIMUS 1 MG PO CP24.    Leane Platt  [facility]  [phone]

## 2022-12-08 NOTE — Progress Notes
Contacted Brandy Joseph to refill their medication(s) TACROLIMUS 1 MG PO CAP.    Patient declined a refill because they are no longer taking the medication. Ambulatory pharmacist notified.    The refill was not processed. The patient requested to no longer receive refill reminder calls. They will be removed from the pharmacy refill management program and will no longer receive refill reminders for any medications. The patient may be re-enrolled in the pharmacy refill management program at any time by contacting the pharmacy. Ambulatory pharmacist notified.  [name]  [facility]  [phone]

## 2022-12-16 ENCOUNTER — Encounter: Admit: 2022-12-16 | Discharge: 2022-12-16 | Payer: MEDICARE

## 2022-12-17 ENCOUNTER — Encounter: Admit: 2022-12-17 | Discharge: 2022-12-17 | Payer: MEDICARE

## 2022-12-17 NOTE — Telephone Encounter
Return TC to office at this time. They are wanting to put patient on diflucan for fungal infection for lesion on foot. Advised to please let us know if she does start this medication as it will increase the efficacy of tacrolimus and dosing will need to be closely monitored and adjusted.

## 2022-12-17 NOTE — Telephone Encounter
Brandy Joseph called she had questions about pt. Please call her back.

## 2022-12-18 ENCOUNTER — Encounter: Admit: 2022-12-18 | Discharge: 2022-12-18 | Payer: MEDICARE

## 2022-12-21 ENCOUNTER — Encounter: Admit: 2022-12-21 | Discharge: 2022-12-21 | Payer: MEDICARE

## 2022-12-21 DIAGNOSIS — Z940 Kidney transplant status: Secondary | ICD-10-CM

## 2022-12-21 MED ORDER — TACROLIMUS 1 MG PO CAP
ORAL_CAPSULE | ORAL | 3 refills | 30.00000 days | Status: AC
Start: 2022-12-21 — End: ?

## 2022-12-24 ENCOUNTER — Encounter: Admit: 2022-12-24 | Discharge: 2022-12-24 | Payer: MEDICARE

## 2022-12-24 DIAGNOSIS — D849 Immunodeficiency, unspecified: Secondary | ICD-10-CM

## 2022-12-24 DIAGNOSIS — Z79899 Other long term (current) drug therapy: Secondary | ICD-10-CM

## 2022-12-24 DIAGNOSIS — Z940 Kidney transplant status: Secondary | ICD-10-CM

## 2022-12-24 LAB — CBC AND DIFF
ABSOLUTE BASO COUNT: 0 x10 3/uL (ref 0.01–0.08)
ABSOLUTE EOS COUNT: 0.1 x10 3/uL — ABNORMAL LOW (ref 0.04–0.54)
ABSOLUTE LYMPH COUNT: 1.3 x10 3/uL (ref 1.18–3.74)
ABSOLUTE MONO COUNT: 0.5 x10-3/ul (ref 0.24–0.86)
ABSOLUTE NEUTROPHIL: 5.2 x10 3/uL (ref 1.56–6.13)
BASOPHILS: 0.4 % (ref 0.1–1.12)
EOSINOPHIL %: 1.8 % — AB (ref 0.7–7.0)
HEMOGLOBIN: 10 g/dL — ABNORMAL LOW (ref >59)
MCV: 89 fL — AB (ref 79.4–94.8)
PLATELET COUNT: 300 x10 3/uL (ref 182–369)
RBC COUNT: 3.8 x10 6/uL — ABNORMAL LOW (ref 3.93–5.22)
WBC COUNT: 7.3 uL — ABNORMAL LOW (ref 3.98–10.04)

## 2022-12-24 LAB — URINALYSIS DIPSTICK REFLEX TO CULTURE
GLUCOSE,UA: NEGATIVE mL — ABNORMAL LOW (ref 0–55)
NITRITE: NEGATIVE mL (ref 34.0–71.1)
URINE BILE: NEGATIVE % — ABNORMAL LOW (ref 19.3–53.1)
URINE BLOOD: NEGATIVE mL (ref 9.4–19.4)
UROBILINOGEN: 0.2 % — ABNORMAL LOW (ref 4–12)

## 2022-12-24 LAB — COMPREHENSIVE METABOLIC PANEL
ANION GAP: 9 meq/L — ABNORMAL LOW (ref 4.5–11.0)
CHLORIDE: 108 — ABNORMAL HIGH (ref 98–107)
CO2: 21 mmol/L — ABNORMAL LOW (ref 22–29)
POTASSIUM: 4.7 mmol/L
SODIUM: 138 mmol/L (ref 136–145)

## 2022-12-24 LAB — URINALYSIS MICROSCOPIC REFLEX TO CULTURE

## 2022-12-24 LAB — PHOSPHORUS: PHOSPHORUS: 2.9 mg/dL

## 2022-12-24 LAB — URIC ACID: URIC ACID: 5.3 (ref 2.6–6.0)

## 2022-12-24 LAB — MAGNESIUM: MAGNESIUM: 1.5 mg/dL — ABNORMAL LOW (ref 1.6–2.6)

## 2022-12-24 NOTE — Telephone Encounter
Kayla called regarding pt's medications. Please call her back.

## 2022-12-24 NOTE — Telephone Encounter
TC to return number x 3. No answer. LVM for callback.

## 2022-12-26 ENCOUNTER — Encounter: Admit: 2022-12-26 | Discharge: 2022-12-26 | Payer: MEDICARE

## 2022-12-29 ENCOUNTER — Encounter: Admit: 2022-12-29 | Discharge: 2022-12-29 | Payer: MEDICARE

## 2022-12-29 DIAGNOSIS — D849 Immunodeficiency, unspecified: Secondary | ICD-10-CM

## 2022-12-29 DIAGNOSIS — Z940 Kidney transplant status: Secondary | ICD-10-CM

## 2022-12-29 DIAGNOSIS — Z79899 Other long term (current) drug therapy: Secondary | ICD-10-CM

## 2022-12-29 LAB — TACROLIMUS LC-MS/MS: TACROLIMUS LC-MS/MS: 6.4

## 2022-12-29 LAB — CMV QUANT PCR-BLOOD

## 2022-12-29 LAB — BK VIRUS QUANT PLASMA

## 2023-01-10 ENCOUNTER — Encounter: Admit: 2023-01-10 | Discharge: 2023-01-10 | Payer: MEDICARE

## 2023-01-10 NOTE — Telephone Encounter
Paged: pt took her morning and evening medications this morning by accident, has concerns    TC to patient. Reviewed meds- watch BP closely today. Come to ED if becomes symptomatic. Do not take evening meds (again) if BP is running low. V/u    Routed to [facility]

## 2023-01-18 ENCOUNTER — Encounter: Admit: 2023-01-18 | Discharge: 2023-01-18 | Payer: MEDICARE

## 2023-01-21 ENCOUNTER — Encounter: Admit: 2023-01-21 | Discharge: 2023-01-21 | Payer: MEDICARE

## 2023-01-21 DIAGNOSIS — Z79899 Other long term (current) drug therapy: Secondary | ICD-10-CM

## 2023-01-21 DIAGNOSIS — D849 Immunodeficiency, unspecified: Secondary | ICD-10-CM

## 2023-01-21 DIAGNOSIS — Z940 Kidney transplant status: Secondary | ICD-10-CM

## 2023-01-21 LAB — MAGNESIUM: MAGNESIUM: 1.8 mg/dL

## 2023-01-21 LAB — URINALYSIS DIPSTICK REFLEX TO CULTURE
GLUCOSE,UA: NEGATIVE
NITRITE: NEGATIVE
PROTEIN,UA: NEGATIVE
URINE BILE: NEGATIVE
URINE BLOOD: NEGATIVE
URINE KETONE: NEGATIVE
URINE PH: 6 (ref 5.0–7.0)
URINE SPEC GRAVITY: 1 (ref 1.002–1.030)

## 2023-01-21 LAB — PROTEIN/CR RATIO,UR RAN
PROT CREAT RAT/CAL: 0 — AB (ref <0.2)
UR CREATININE, RAN: 134 mg/dL — ABNORMAL HIGH (ref 47–110)
UR TOTAL PROTEIN,RAN: 11 mg/dL — ABNORMAL HIGH (ref <0.15)

## 2023-01-21 LAB — COMPREHENSIVE METABOLIC PANEL
ANION GAP: 8 meq/L (ref 8–15)
BLD UREA NITROGEN: 28 mg/dL — ABNORMAL HIGH (ref 7.0–18.7)
CHLORIDE: 107 mmol/L
CO2: 25 mmol/L (ref 22–29)
CREATININE: 1.5 mg/dL — ABNORMAL HIGH (ref 0.57–1.11)
POTASSIUM: 4.3 mmol/L
SODIUM: 140 mmol/L (ref 136–145)

## 2023-01-21 LAB — CBC AND DIFF
HEMATOCRIT: 34 % (ref 34.1–44.9)
HEMOGLOBIN: 10 g/dL — ABNORMAL LOW (ref 11.2–16.7)
MCHC: 31 U/L — ABNORMAL LOW (ref 32.2–35.5)
MCV: 86 U/L (ref 78.4–94.8)
RBC COUNT: 3.9 uL — ABNORMAL HIGH (ref 3.93–5.22)
WBC COUNT: 6 uL — ABNORMAL LOW (ref >59)

## 2023-01-21 LAB — URIC ACID: URIC ACID: 6.4 — ABNORMAL HIGH (ref 2.6–6.0)

## 2023-01-21 LAB — PHOSPHORUS: PHOSPHORUS: 3.6 mg/dL (ref 2.3–4.7)

## 2023-01-22 ENCOUNTER — Encounter: Admit: 2023-01-22 | Discharge: 2023-01-22 | Payer: MEDICARE

## 2023-01-22 DIAGNOSIS — M84361A Stress fracture, right tibia, initial encounter for fracture: Secondary | ICD-10-CM

## 2023-01-22 DIAGNOSIS — M25561 Pain in right knee: Secondary | ICD-10-CM

## 2023-01-22 NOTE — Telephone Encounter
Patient scheduled with Dr. Desiree Hane on 07/22 for acute stress fx. Contacted patient to discuss, and left VM to call back.     Spoke with DPOA to confirm appointment. 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 States she had MRI and XR done at [facility]. Images requested and imported. Images were from February and April of this year.     Due to chronicity of injury and known meniscus issue, patient referred to Dr. Criselda Peaches at Dr. Filomena Jungling request. Let DPOA know of this, and offered referral which they gladly accepted.     DPOA requested that when appts are made, if she could be the point of contact as the patient has memory issues and her inclusion will allow for the best overall patient care experience.

## 2023-01-23 ENCOUNTER — Encounter: Admit: 2023-01-23 | Discharge: 2023-01-23 | Payer: MEDICARE

## 2023-01-25 ENCOUNTER — Encounter: Admit: 2023-01-25 | Discharge: 2023-01-25 | Payer: MEDICARE

## 2023-01-25 DIAGNOSIS — Z79899 Other long term (current) drug therapy: Secondary | ICD-10-CM

## 2023-01-25 DIAGNOSIS — Z940 Kidney transplant status: Secondary | ICD-10-CM

## 2023-01-25 DIAGNOSIS — D849 Immunodeficiency, unspecified: Secondary | ICD-10-CM

## 2023-01-25 LAB — CMV QUANT PCR-BLOOD

## 2023-01-26 ENCOUNTER — Encounter: Admit: 2023-01-26 | Discharge: 2023-01-26 | Payer: MEDICARE

## 2023-01-26 DIAGNOSIS — Z940 Kidney transplant status: Secondary | ICD-10-CM

## 2023-01-26 DIAGNOSIS — D849 Immunodeficiency, unspecified: Secondary | ICD-10-CM

## 2023-01-26 DIAGNOSIS — Z79899 Other long term (current) drug therapy: Secondary | ICD-10-CM

## 2023-01-26 LAB — BK VIRUS QUANT PLASMA

## 2023-01-26 LAB — TACROLIMUS LC-MS/MS: TACROLIMUS LC-MS/MS: 11

## 2023-01-27 ENCOUNTER — Encounter: Admit: 2023-01-27 | Discharge: 2023-01-27 | Payer: MEDICARE

## 2023-01-27 ENCOUNTER — Ambulatory Visit: Admit: 2023-01-27 | Discharge: 2023-01-27 | Payer: MEDICARE

## 2023-01-27 DIAGNOSIS — I10 Essential (primary) hypertension: Secondary | ICD-10-CM

## 2023-01-27 DIAGNOSIS — B348 Other viral infections of unspecified site: Secondary | ICD-10-CM

## 2023-01-27 DIAGNOSIS — M549 Dorsalgia, unspecified: Secondary | ICD-10-CM

## 2023-01-27 DIAGNOSIS — R413 Other amnesia: Secondary | ICD-10-CM

## 2023-01-27 DIAGNOSIS — I639 Cerebral infarction, unspecified: Secondary | ICD-10-CM

## 2023-01-27 DIAGNOSIS — T8859XA Other complications of anesthesia, initial encounter: Secondary | ICD-10-CM

## 2023-01-27 DIAGNOSIS — N289 Disorder of kidney and ureter, unspecified: Secondary | ICD-10-CM

## 2023-01-27 DIAGNOSIS — E785 Hyperlipidemia, unspecified: Secondary | ICD-10-CM

## 2023-01-27 DIAGNOSIS — E119 Type 2 diabetes mellitus without complications: Secondary | ICD-10-CM

## 2023-01-27 DIAGNOSIS — K3184 Gastroparesis: Secondary | ICD-10-CM

## 2023-01-27 DIAGNOSIS — E109 Type 1 diabetes mellitus without complications: Secondary | ICD-10-CM

## 2023-01-27 NOTE — Patient Instructions
J. Paul Schroeppel, MD  Seth Morgan, PA-C  [facility] - Phone [phone] - Fax [phone]   [address], [address] - [place], [place] [address]  Mirinda Monte, RN - Clinical Nurse Coordinator  Drew Hutchison, ATC - Clinical Athletic Trainer    Please call [phone] for follow up appointments

## 2023-01-29 ENCOUNTER — Encounter: Admit: 2023-01-29 | Discharge: 2023-01-29 | Payer: MEDICARE

## 2023-02-01 ENCOUNTER — Encounter: Admit: 2023-02-01 | Discharge: 2023-02-01 | Payer: MEDICARE

## 2023-02-01 DIAGNOSIS — Z940 Kidney transplant status: Secondary | ICD-10-CM

## 2023-02-01 MED ORDER — TACROLIMUS 1 MG PO CAP
1 mg | ORAL_CAPSULE | Freq: Two times a day (BID) | ORAL | 3 refills | 30.00000 days | Status: AC
Start: 2023-02-01 — End: ?

## 2023-02-01 NOTE — Telephone Encounter
Received the following orders from Dr. Chales Abrahams:    Decrease tacrolimus to 1/1 (from 1/2)  Repeat labs in 2 weeks     Mychart message to patient. Medication list updated

## 2023-02-04 ENCOUNTER — Encounter: Admit: 2023-02-04 | Discharge: 2023-02-04 | Payer: MEDICARE

## 2023-02-04 NOTE — Telephone Encounter
Patient requesting Celene Skeen call her with a plan. RN will inform Pennie Rushing. Questions answered, instructed patient to call [phone] with any other questions or concerns.

## 2023-02-14 ENCOUNTER — Encounter: Admit: 2023-02-14 | Discharge: 2023-02-14 | Payer: MEDICARE

## 2023-02-18 ENCOUNTER — Encounter: Admit: 2023-02-18 | Discharge: 2023-02-18 | Payer: MEDICARE

## 2023-02-18 DIAGNOSIS — Z79899 Other long term (current) drug therapy: Secondary | ICD-10-CM

## 2023-02-18 DIAGNOSIS — Z940 Kidney transplant status: Secondary | ICD-10-CM

## 2023-02-18 DIAGNOSIS — D849 Immunodeficiency, unspecified: Secondary | ICD-10-CM

## 2023-02-18 LAB — COMPREHENSIVE METABOLIC PANEL
ALBUMIN: 3.9 g/dL
ALK PHOSPHATASE: 63 U/L (ref 40–150)
ALT: 19 U/L (ref 0–55)
ANION GAP: 11 meq/L (ref 8–16)
AST: 13 U/L (ref 5–34)
BLD UREA NITROGEN: 21 mg/dL — ABNORMAL HIGH (ref 7.0–18.7)
CALCIUM: 9.6 mg/dL (ref 8.4–10.2)
CHLORIDE: 106 mmol/L (ref 98–107)
CO2: 24 (ref 22–29)
CREATININE: 1.2 mg/dL (ref 0.57–11.11)
EGFR: 52 mL/min/{1.73_m2} — ABNORMAL LOW (ref >59)
GLUCOSE,RANDOM: 123 mg/dL — ABNORMAL HIGH (ref 70–105)
POTASSIUM: 4.2 mmol/L (ref 3.5–5.1)
SODIUM: 141 mmol/L (ref 136–145)
TOTAL BILIRUBIN: 0.4 mg/dL (ref 0.2–1.2)
TOTAL PROTEIN: 7 g/dL (ref 6.4–8.3)

## 2023-02-18 LAB — CBC AND DIFF
ABSOLUTE BASO COUNT: 0 10*3/uL (ref 0.01–0.08)
ABSOLUTE EOS COUNT: 0.1 /uL
ABSOLUTE LYMPH COUNT: 1.9 /uL (ref 1.18–3.74)
ABSOLUTE MONO COUNT: 0.5 10*3/uL (ref 0.24–0.86)
ABSOLUTE NEUTROPHIL: 4.2 /uL (ref 1.56–6.13)
BASOPHILS: 0.4 %
EOSINOPHIL %: 2.2 % (ref 0.7–7.0)
HEMATOCRIT: 32 % — ABNORMAL LOW (ref 34.1–44.9)
HEMOGLOBIN: 9.9 g/dL — ABNORMAL LOW (ref 11.2–15.7)
LYMPHOCYTES: 28 % (ref 19.3–53.1)
MCH: 26 pg (ref 25.6–32.2)
MCHC: 30 g/dL — ABNORMAL LOW (ref 32.2–35.5)
MCV: 86 fL (ref 79.4–94.8)
MONOCYTES %: 8.2 % (ref 4.7–12.5)
MPV: 10
NEUTROPHILS %: 60 % (ref 34.0–71.1)
PLATELET COUNT: 253 uL (ref 182–369)
RBC COUNT: 3.7 10*6/uL — ABNORMAL LOW (ref 3.93–5.22)
RDW: 14
WBC COUNT: 6.9 FL (ref 3.98–10.04)

## 2023-02-18 LAB — URINALYSIS DIPSTICK REFLEX TO CULTURE
GLUCOSE,UA: NEGATIVE mL
LEUKOCYTES: NEGATIVE mL
NITRITE: NEGATIVE mL
PROTEIN,UA: NEGATIVE mL
URINE BILE: NEGATIVE
URINE BLOOD: NEGATIVE mL
URINE KETONE: NEGATIVE mL
URINE PH: 6.5 mL (ref 5.0–7.0)
URINE SPEC GRAVITY: 1 mL (ref 1.002–1.030)
UROBILINOGEN: 0.2

## 2023-02-18 LAB — URIC ACID: URIC ACID: 5.4 mg/dL (ref 2.6–6)

## 2023-02-18 LAB — PHOSPHORUS: PHOSPHORUS: 3.3 mg/dL

## 2023-02-18 LAB — MAGNESIUM: MAGNESIUM: 1.7 mg/dL (ref 1.6–2.6)

## 2023-02-18 NOTE — Telephone Encounter
LVM with patient to schedule a f/u with Dr. Royann Shivers for right knee eval per Celene Skeen, PA-C request

## 2023-02-22 ENCOUNTER — Encounter: Admit: 2023-02-22 | Discharge: 2023-02-22 | Payer: MEDICARE

## 2023-02-22 DIAGNOSIS — Z940 Kidney transplant status: Secondary | ICD-10-CM

## 2023-02-22 DIAGNOSIS — Z79899 Other long term (current) drug therapy: Secondary | ICD-10-CM

## 2023-02-22 DIAGNOSIS — D849 Immunodeficiency, unspecified: Secondary | ICD-10-CM

## 2023-02-22 LAB — CMV QUANT PCR-BLOOD

## 2023-02-22 LAB — TACROLIMUS LC-MS/MS: TACROLIMUS LC-MS/MS: 6.4

## 2023-02-22 LAB — BK VIRUS QUANT PLASMA

## 2023-02-22 MED ORDER — PREDNISONE 5 MG PO TAB
5 mg | ORAL_TABLET | Freq: Every day | ORAL | 3 refills | Status: AC
Start: 2023-02-22 — End: ?

## 2023-02-22 MED ORDER — CHOLECALCIFEROL (VITAMIN D3) 50 MCG (2,000 UNIT) PO TAB
2000 [IU] | ORAL_TABLET | Freq: Every day | ORAL | 3 refills | 84.00000 days | Status: AC
Start: 2023-02-22 — End: ?

## 2023-02-23 ENCOUNTER — Encounter: Admit: 2023-02-23 | Discharge: 2023-02-23 | Payer: MEDICARE

## 2023-02-24 ENCOUNTER — Encounter: Admit: 2023-02-24 | Discharge: 2023-02-24 | Payer: MEDICARE

## 2023-02-24 DIAGNOSIS — Z940 Kidney transplant status: Secondary | ICD-10-CM

## 2023-02-24 DIAGNOSIS — Z79899 Other long term (current) drug therapy: Secondary | ICD-10-CM

## 2023-02-24 DIAGNOSIS — D849 Immunodeficiency, unspecified: Secondary | ICD-10-CM

## 2023-02-24 LAB — PROTEIN/CR RATIO,UR RAN
PROT CREAT RAT/CAL: 0 mg/mg (ref <0.2)
UR CREATININE, RAN: 104 mg/dL (ref 47–110)
UR TOTAL PROTEIN,RAN: 7.5 mg/dL (ref 1–14)

## 2023-02-24 NOTE — Telephone Encounter
After hours page received, pt realized when she took her evening dose of IS that she missed her morning dose today.  Advised her to not take extra meds but get back on track with morning meds tomorrow.

## 2023-03-02 ENCOUNTER — Encounter: Admit: 2023-03-02 | Discharge: 2023-03-02 | Payer: MEDICARE

## 2023-03-03 ENCOUNTER — Encounter: Admit: 2023-03-03 | Discharge: 2023-03-03 | Payer: MEDICARE

## 2023-03-11 ENCOUNTER — Encounter: Admit: 2023-03-11 | Discharge: 2023-03-11 | Payer: MEDICARE

## 2023-03-17 ENCOUNTER — Encounter: Admit: 2023-03-17 | Discharge: 2023-03-17 | Payer: MEDICARE

## 2023-03-17 NOTE — Patient Instructions
J. Paul Schroeppel, MD  Seth Morgan, PA-C  [facility] - Phone [phone] - Fax [phone]   [address], [address] - [place], [place] [address]  Mirinda Monte, RN - Clinical Nurse Coordinator  Drew Hutchison, ATC - Clinical Athletic Trainer    Please call [phone] for follow up appointments

## 2023-03-18 ENCOUNTER — Ambulatory Visit: Admit: 2023-03-17 | Discharge: 2023-03-18 | Payer: MEDICARE

## 2023-03-18 DIAGNOSIS — G8929 Other chronic pain: Secondary | ICD-10-CM

## 2023-03-18 DIAGNOSIS — M25561 Pain in right knee: Secondary | ICD-10-CM

## 2023-03-19 ENCOUNTER — Encounter: Admit: 2023-03-19 | Discharge: 2023-03-19 | Payer: MEDICARE

## 2023-03-19 DIAGNOSIS — D849 Immunodeficiency, unspecified: Secondary | ICD-10-CM

## 2023-03-19 DIAGNOSIS — Z79899 Other long term (current) drug therapy: Secondary | ICD-10-CM

## 2023-03-19 DIAGNOSIS — Z940 Kidney transplant status: Secondary | ICD-10-CM

## 2023-03-19 LAB — CBC AND DIFF
ABSOLUTE EOS COUNT: 0 10*3/uL (ref 0.04–0.54)
ABSOLUTE LYMPH COUNT: 1.9 10*3/uL (ref 1.18–3.74)
ABSOLUTE MONO COUNT: 0.5 10*3/uL
ABSOLUTE NEUTROPHIL: 3 10*3/uL (ref 1.56–6.13)
BASOPHILS: 0.4 % (ref 0.1–1.2)
EOSINOPHIL %: 1.4 % (ref 0.7–7.0)
HEMATOCRIT: 36 % (ref 34.1–44.9)
HEMOGLOBIN: 11
LYMPHOCYTES: 33 % (ref 19.3–53.1)
MCH: 27 pg (ref 25.6–32.2)
MCHC: 31 g/dL — ABNORMAL LOW (ref 32.2–35.5)
MCV: 89 fL (ref 79.4–94.8)
MONOCYTES %: 9.9 % (ref 4.7–12.5)
MPV: 13 — ABNORMAL HIGH (ref 9.4–12.4)
NEUTROPHILS %: 54 % (ref 34.0–71.1)
PLATELET COUNT: 33 10*3/uL — ABNORMAL LOW (ref 182–369)
RBC COUNT: 4.1 10*6/uL (ref 3.93–5.22)
RDW: 14
WBC COUNT: 5.6

## 2023-03-19 LAB — URINALYSIS DIPSTICK REFLEX TO CULTURE
GLUCOSE,UA: NEGATIVE
LEUKOCYTES: NEGATIVE
NITRITE: NEGATIVE — ABNORMAL HIGH (ref 10–49)
PROTEIN,UA: NEGATIVE — ABNORMAL HIGH (ref 11.6–14.4)
URINE BILE: NEGATIVE — ABNORMAL HIGH (ref <=33)
URINE BLOOD: NEGATIVE — ABNORMAL HIGH (ref 46–116)
URINE KETONE: NEGATIVE g/dL
URINE PH: 6 — ABNORMAL LOW (ref >=90)
URINE SPEC GRAVITY: 1 (ref 1.002–1.030)
UROBILINOGEN: 0.2 mg/dL

## 2023-03-19 LAB — PROTEIN/CR RATIO,UR RAN
PROT CREAT RAT/CAL: 0 mL (ref <0.2)
UR CREATININE, RAN: 222 mg/dL — ABNORMAL HIGH (ref 47–110)
UR TOTAL PROTEIN,RAN: 13 mg/dL (ref 1–14)

## 2023-03-22 ENCOUNTER — Encounter: Admit: 2023-03-22 | Discharge: 2023-03-22 | Payer: MEDICARE

## 2023-03-22 DIAGNOSIS — Z79899 Other long term (current) drug therapy: Secondary | ICD-10-CM

## 2023-03-22 DIAGNOSIS — Z940 Kidney transplant status: Secondary | ICD-10-CM

## 2023-03-22 DIAGNOSIS — D849 Immunodeficiency, unspecified: Secondary | ICD-10-CM

## 2023-03-22 LAB — COMPREHENSIVE METABOLIC PANEL
ALBUMIN: 4.2 g/dL (ref 0–0.20)
ALK PHOSPHATASE: 69 10*3/uL (ref 0–0.80)
ALT: 18 10*3/uL (ref 1.0–4.8)
ANION GAP: 11 10*3/uL (ref 150–400)
AST: 16 10*3/uL (ref 1.8–7.0)
BLD UREA NITROGEN: 24 FL — ABNORMAL HIGH (ref 7.0–18.7)
CALCIUM: 10 % (ref >60)
CHLORIDE: 106 g/dL (ref 32.0–36.0)
CO2: 22 % (ref 11–15)
CREATININE: 1.2 mg/dL — ABNORMAL HIGH (ref 0.57–1.11)
EGFR: 53 % — ABNORMAL LOW (ref >59)
GLUCOSE,RANDOM: 76 % (ref 4–12)
POTASSIUM: 3.9 pg (ref 26–34)
SODIUM: 139 mmol/L (ref 80–100)
TOTAL BILIRUBIN: 0.3 mg/dL (ref 0–2)
TOTAL PROTEIN: 8.1 10*3/uL (ref 0–0.45)

## 2023-03-22 LAB — MAGNESIUM: MAGNESIUM: 1.9 g/dL (ref 32.0–36.0)

## 2023-03-22 LAB — PHOSPHORUS: PHOSPHORUS: 3.4 pg (ref 26–34)

## 2023-03-22 LAB — URIC ACID: URIC ACID: 6.3 % — ABNORMAL HIGH (ref 2.6–6.0)

## 2023-03-22 MED ORDER — FEROSUL 325 MG (65 MG IRON) PO TAB
325 mg | ORAL_TABLET | Freq: Every day | ORAL | 1 refills
Start: 2023-03-22 — End: ?

## 2023-03-22 MED ORDER — MYCOPHENOLATE SODIUM 180 MG PO TBEC
180 mg | ORAL_TABLET | Freq: Two times a day (BID) | ORAL | 0 refills
Start: 2023-03-22 — End: ?

## 2023-03-22 MED ORDER — BIOTIN 5 MG PO CAP
1 | ORAL_CAPSULE | Freq: Every day | ORAL | 0 refills
Start: 2023-03-22 — End: ?

## 2023-03-24 ENCOUNTER — Encounter: Admit: 2023-03-24 | Discharge: 2023-03-24 | Payer: MEDICARE

## 2023-03-24 DIAGNOSIS — Z5181 Encounter for therapeutic drug level monitoring: Secondary | ICD-10-CM

## 2023-03-24 DIAGNOSIS — Z79899 Other long term (current) drug therapy: Secondary | ICD-10-CM

## 2023-03-24 DIAGNOSIS — R799 Abnormal finding of blood chemistry, unspecified: Secondary | ICD-10-CM

## 2023-03-24 DIAGNOSIS — Z940 Kidney transplant status: Secondary | ICD-10-CM

## 2023-03-24 DIAGNOSIS — D849 Immunodeficiency, unspecified: Secondary | ICD-10-CM

## 2023-03-24 LAB — TACROLIMUS LC-MS/MS: TACROLIMUS LC-MS/MS: 8.1

## 2023-03-25 ENCOUNTER — Encounter: Admit: 2023-03-25 | Discharge: 2023-03-25 | Payer: MEDICARE

## 2023-03-25 DIAGNOSIS — D849 Immunodeficiency, unspecified: Secondary | ICD-10-CM

## 2023-03-25 DIAGNOSIS — Z79899 Other long term (current) drug therapy: Secondary | ICD-10-CM

## 2023-03-25 DIAGNOSIS — Z940 Kidney transplant status: Secondary | ICD-10-CM

## 2023-03-25 LAB — BK VIRUS QUANT PLASMA

## 2023-03-25 LAB — CMV QUANT PCR-BLOOD

## 2023-03-29 ENCOUNTER — Encounter: Admit: 2023-03-29 | Discharge: 2023-03-29 | Payer: MEDICARE

## 2023-04-02 ENCOUNTER — Encounter: Admit: 2023-04-02 | Discharge: 2023-04-02 | Payer: MEDICARE

## 2023-04-12 NOTE — Progress Notes
LOV 08/11/22 - 68mo f/u (canceled apt 5/28/24_  - Repeat DSA in July (not done), do today  -elevated creat, monitor with DSA and allosure, echo    IS: labs every 63month  -myfortic 180 mg bid  -prednisone 5mg  daily  -prograf 1mg  bid  -valcyte?    Health Maintenance:  Pap smear: UTD  Mammogram: UTD  Dermatology: seeing regularly, [place] in [facility]    08/19/22 DSA  Beads I Positive: No donor specific antibody is present   Beads II Negative: No donor specific antibody is present      Visit:   Starting Adapalene gel 0.3% for acne-ok per transplant pharmacy and provider  Patient prefers to not see local neph, would like to stay with   Tore ligaments right knee while working with PT, using walker today  Patient unable to recall medications when doing reconciliation- it should be up to date, Aunt fills patient's pill box. Patient to send picture of med list of current medications- RTC 5mo nurse visit to fill pill box  Bps at home around 120s/70s  Unable to tolerate ozempic- pt doesn't want to take: Referral sent for  dietitian and weight management clinic      Labs today including DSA, upkeep  Karnofsky score: completed Not currently working- disability

## 2023-04-13 ENCOUNTER — Ambulatory Visit: Admit: 2023-04-13 | Discharge: 2023-04-13 | Payer: MEDICARE

## 2023-04-13 ENCOUNTER — Encounter: Admit: 2023-04-13 | Discharge: 2023-04-13 | Payer: MEDICARE

## 2023-04-13 DIAGNOSIS — Z940 Kidney transplant status: Secondary | ICD-10-CM

## 2023-04-13 DIAGNOSIS — N289 Disorder of kidney and ureter, unspecified: Secondary | ICD-10-CM

## 2023-04-13 DIAGNOSIS — R413 Other amnesia: Secondary | ICD-10-CM

## 2023-04-13 DIAGNOSIS — D849 Immunodeficiency, unspecified: Secondary | ICD-10-CM

## 2023-04-13 DIAGNOSIS — E119 Type 2 diabetes mellitus without complications: Secondary | ICD-10-CM

## 2023-04-13 DIAGNOSIS — E109 Type 1 diabetes mellitus without complications: Secondary | ICD-10-CM

## 2023-04-13 DIAGNOSIS — Z79899 Other long term (current) drug therapy: Secondary | ICD-10-CM

## 2023-04-13 DIAGNOSIS — R799 Abnormal finding of blood chemistry, unspecified: Secondary | ICD-10-CM

## 2023-04-13 DIAGNOSIS — T8859XA Other complications of anesthesia, initial encounter: Secondary | ICD-10-CM

## 2023-04-13 DIAGNOSIS — Z5181 Encounter for therapeutic drug level monitoring: Secondary | ICD-10-CM

## 2023-04-13 DIAGNOSIS — E785 Hyperlipidemia, unspecified: Secondary | ICD-10-CM

## 2023-04-13 DIAGNOSIS — M549 Dorsalgia, unspecified: Secondary | ICD-10-CM

## 2023-04-13 DIAGNOSIS — K3184 Gastroparesis: Secondary | ICD-10-CM

## 2023-04-13 DIAGNOSIS — I10 Essential (primary) hypertension: Secondary | ICD-10-CM

## 2023-04-13 DIAGNOSIS — B348 Other viral infections of unspecified site: Secondary | ICD-10-CM

## 2023-04-13 DIAGNOSIS — I639 Cerebral infarction, unspecified: Secondary | ICD-10-CM

## 2023-04-13 DIAGNOSIS — R7303 Prediabetes: Secondary | ICD-10-CM

## 2023-04-13 DIAGNOSIS — Z6836 Body mass index (BMI) 36.0-36.9, adult: Secondary | ICD-10-CM

## 2023-04-13 LAB — CBC AND DIFF
HEMATOCRIT: 33 % — ABNORMAL LOW (ref 36–45)
HEMOGLOBIN: 10 g/dL — ABNORMAL LOW (ref 12.0–15.0)
MCH: 28 pg (ref 26–34)
MCHC: 32 g/dL (ref 32.0–36.0)
MCV: 88 FL (ref 80–100)
PLATELET COUNT: 262 10*3/uL (ref 150–400)
RBC COUNT: 3.7 M/UL — ABNORMAL LOW (ref 4.0–5.0)
RDW: 15 % — ABNORMAL HIGH (ref 11–15)
WBC COUNT: 7.7 10*3/uL (ref 4.5–11.0)

## 2023-04-13 LAB — IRON + BINDING CAPACITY + %SAT+ FERRITIN
IRON BINDING: 334 ug/dL (ref 270–380)
IRON: 47 ug/dL — ABNORMAL LOW (ref 50–160)

## 2023-04-13 LAB — LIPID PROFILE
CHOLESTEROL: 147 mg/dL — ABNORMAL LOW (ref <200)
LDL: 67 mg/dL (ref <100)
NON HDL CHOLESTEROL: 79 mg/dL
TRIGLYCERIDES: 94 mg/dL (ref <150)
VLDL: 19 mg/dL

## 2023-04-13 LAB — PARATHYROID HORMONE: PTH HORMONE: 90 pg/mL — ABNORMAL HIGH (ref >40)

## 2023-04-13 LAB — URINALYSIS DIPSTICK REFLEX TO CULTURE: URINE SPEC GRAVITY: 1 g/dL (ref 1.005–1.030)

## 2023-04-13 LAB — COMPREHENSIVE METABOLIC PANEL
POTASSIUM: 3.8 MMOL/L (ref 3.5–5.1)
SODIUM: 139 MMOL/L (ref 137–147)

## 2023-04-13 LAB — 25-OH VITAMIN D (D2 + D3): VITAMIN D (25-OH) TOTAL: 34 ng/mL (ref 30–80)

## 2023-04-13 LAB — HEMOGLOBIN A1C: HEMOGLOBIN A1C: 7.3 % — ABNORMAL HIGH (ref 4.0–5.7)

## 2023-04-13 LAB — MAGNESIUM: MAGNESIUM: 1.6 mg/dL (ref 1.6–2.6)

## 2023-04-13 LAB — PROTEIN/CR RATIO,UR RAN
UR CREATININE, RAN: 35 mg/dL
UR TOTAL PROTEIN,RAN: <4 mg/dL

## 2023-04-13 LAB — URINALYSIS MICROSCOPIC REFLEX TO CULTURE

## 2023-04-13 LAB — URIC ACID: URIC ACID: 5.1 mg/dL (ref 2.0–7.0)

## 2023-04-13 LAB — FK506 TACROLIMUS: TACROLIMUS: 6 ng/mL (ref 5.0–15.0)

## 2023-04-13 LAB — PHOSPHORUS: PHOSPHORUS: 3.4 mg/dL (ref 2.0–4.5)

## 2023-04-13 NOTE — Progress Notes
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  Donor: DCD, IRD: Yes, Age: 31s, female, Ht 5'11. Wt 215 lbs. Creatinine Initial/Terminal. ?/1.8  KDPI: 47%, cPRA:  75%  Match: VXM Negative, PXM -ve  CMV: D+/R+  Induction: thymoglobulin  Maintenance Immunosuppression: Triple drug therapy  Post OP: good UOP post op    Referring Nephrologist:  Brandy Joseph  [address] [place] [address]  Phone: [phone]  Fax: [phone]     Dear Dr. Lorre Joseph,    We had the pleasure of meeting Brandy Joseph in the [facility] for routine evaluation of her renal transplant. Has right knee issue. Continues to gain weight. Does not remember any medications that she is on- aunt fill th epill box based on list from my chart( the list seems wrong). We cleared up the list based on best estimate. No BP readings at home. Undergoing mild PT. Still using a walker. Has trouble remembering things. Urination is well, denies dysuria, hematuria.  Denies chest pain, shortness of breath, recent illnesses or surgeries.  Blood sugars have been within normal limits, continues to follow with endocrine.Different acne creams.      REVIEW OF SYSTEMS: Comprehensive 14-point ROS reviewed  Positives noted in HPI otherwise negative.    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    Complication of anesthesia 2012    Woke up during galbladder surgery    Diabetes mellitus type 1 (HCC) 1984    DM (diabetes mellitus) (HCC)     Gastroparesis     Hyperlipemia     Hypertension     Kidney disease     Memory loss     Stroke [facility])        Surgical History:   Procedure Laterality Date    ANKLE SURGERY Left 1996    FIBULA FRACTURE SURGERY Left 1996    ESOPHAGOGASTRODUODENOSCOPY WITH BIOPSY - FLEXIBLE N/A 03/29/2018    Performed by Brandy Organ, MD at [facility] ENDO    ESOPHAGOGASTRODUODENOSCOPY WITH BIOPSY - FLEXIBLE N/A 10/04/2019    Performed by Brandy All, MD at [facility] ENDO    COLONOSCOPY WITH BIOPSY - FLEXIBLE N/A 10/04/2019    Performed by Brandy All, MD at [facility] ENDO    ANGIOGRAPHY CORONARY ARTERY WITH LEFT HEART CATHETERIZATION N/A 01/18/2020    Performed by Brandy Mosher, MD at [facility] CATH LAB    POSSIBLE PERCUTANEOUS CORONARY STENT PLACEMENT WITH ANGIOPLASTY N/A 01/18/2020    Performed by Brandy Mosher, MD at [facility] CATH LAB    COLONOSCOPY WITH BIOPSY - FLEXIBLE N/A 05/15/2020    Performed by Brandy Nine, MD at [facility] ENDO    COLONOSCOPY WITH SNARE REMOVAL TUMOR/ POLYP/ OTHER LESION  05/15/2020    Performed by Brandy Nine, MD at [facility] ENDO    ALLOTRANSPLANTATION KIDNEY FROM NON LIVING DONOR WITHOUT RECIPIENT NEPHRECTOMY N/A 05/07/2021    Performed by Brandy Johns, MD at [facility] OR    BURR HOLE      release pressure from stroke    DIALYSIS FISTULA CREATION      GALLBLADDER SURGERY  HX CESAREAN SECTION      HX EYE SURGERY  03/2020    Retina bleed    KIDNEY SURGERY      KIDNEY TRANSPLANT         Social History     Socioeconomic History    Marital status: Divorced    Number of children: 1    Highest education level: Associate degree: occupational, Scientist, product/process development, or vocational program   Tobacco Use    Smoking status: Never    Smokeless tobacco: Never    Tobacco comments:     Several people in my famiky smoke, so ive been exposed   Vaping Use    Vaping status: Never Used   Substance and Sexual Activity    Alcohol use: Yes     Alcohol/week: 3.0 standard drinks of alcohol     Types: 3 Glasses of wine per week     Comment: occasional glass of wine    Drug use: Never    Sexual activity: Yes     Partners: Male     Birth control/protection: Surgical       Family History   Problem Relation Name Age of Onset    Stroke Mother Brandy Joseph         Knee surgery led to stroke Diabetes Paternal Grandmother Brandy Joseph     Heart problem Paternal Grandmother Brandy Joseph     Hypertension Paternal Grandmother Brandy Joseph     Tumor Maternal Grandmother Brandy Joseph         brain    Cancer Maternal Grandmother Brandy Joseph         Brain tumor    None Reported Son      Diabetes Grandparent Brandy Joseph     Cancer Grandparent Brandy Joseph         Stage 4 Brain tumor    Cancer Grandparent Brandy Joseph         Alzheimers       Allergies   Allergen Reactions    Ketorolac RASH    Latex RASH    Penicillin RASH    Sulfa (Sulfonamide Antibiotics) EDEMA and ITCHING     Full body swelling; eye swelling; itchiness    Tromethamine SEE COMMENTS     unknown    Other reaction(s): Other (See Comments)   unknown       Current Medications:    Current Outpatient Medications:     albuterol sulfate (PROAIR HFA) 90 mcg/actuation HFA aerosol inhaler, every 4 hours., Disp: , Rfl:     ALPRAZolam (XANAX) 0.25 mg tablet, , Disp: , Rfl:     amitriptyline (ELAVIL) 25 mg tablet, Take one tablet by mouth daily., Disp: , Rfl:     amLODIPine (NORVASC) 10 mg tablet, Take one tablet by mouth daily., Disp: , Rfl:     aspirin EC (ASPIR-LOW) 81 mg tablet, Take one tablet by mouth daily. Take with food., Disp: 90 tablet, Rfl:     Biotin 5 mg cap, TAKE 1 CAPSULE BY MOUTH EVERY DAY, Disp: 90 capsule, Rfl: 0    bisacodyL (DULCOLAX) 10 mg rectal suppository, every 24 hours., Disp: , Rfl:     blood sugar diagnostic (BLOOD GLUCOSE TEST) test strip, E10.65 Type 1 Diabetes; ACCUCHECK TEST GUIDE TEST STRIPS; USE TO CHECK BLOOD SUGAR 6 TIMES DAILY  Indications: type 1 diabetes mellitus, Disp: 300 each, Rfl: 3    buPROPion XL (WELLBUTRIN XL) 150 mg tablet, Take one tablet by mouth every morning. Do not crush or chew.,  Disp: , Rfl:     carvediloL (COREG) 25 mg tablet, TAKE 1 AND 1/2 TABLETS BY MOUTH TWICE DAILY WITH FOOD, Disp: 270 tablet, Rfl: 2    cetirizine (ZYRTEC) 10 mg tablet, Take one tablet by mouth daily., Disp: , Rfl: CHOLEcalciferoL (vitamin D3) 50 mcg (2,000 unit) tablet, TAKE 1 TABLET BY MOUTH EVERY DAY, Disp: 90 tablet, Rfl: 3    clascoterone (WINLEVI) 1 % cream, 1 Application every 12 hours., Disp: , Rfl:     cloNIDine HCL (CATAPRES) 0.2 mg tablet, Take one tablet by mouth twice daily., Disp: , Rfl:     clotrimazole (LOTRIMIN) 1 % topical cream, , Disp: , Rfl:     docusate (COLACE) 100 mg capsule, Take one capsule by mouth twice daily., Disp: 180 capsule, Rfl: 1    FEROSUL 325 mg (65 mg iron) tablet, TAKE 1 TABLET BY MOUTH EVERY DAY, Disp: 90 tablet, Rfl: 1    insulin lispro (HUMALOG U-100 INSULIN) 100 unit/mL injection, USE WITH INSULIN PUMP MAX DAILY DOSE OF 70 UNITS **TARGET RANGE 110-130**, Disp: 100 mL, Rfl: 0    insulin pen needles (disposable) (BD UF NANO PEN NEEDLES) 32 gauge x 5/32 pen needle, Use one each as directed as Needed. Use with insulin injections., Disp: 300 each, Rfl: 0    Insulin Syringe-Needle U-100 (BD INSULIN SYRINGE ULTRA-FINE) 1 mL 31 gauge x 5/16 syrg, Use with insulin vials as directed, Disp: 100 each, Rfl: 1    lidocaine (LIDODERM) 5 % topical patch, , Disp: , Rfl:     lisinopril (ZESTRIL) 5 mg tablet, Take one tablet by mouth at bedtime daily. Indications: high blood pressure, Disp: 30 tablet, Rfl: 2    magnesium oxide (MAGOX) 400 mg (241.3 mg magnesium) tablet, Take two tablets by mouth daily., Disp: 30 tablet, Rfl: 5    mycophenolate sodium (MYFORTIC) 180 mg tablet,delayed release, TAKE 1 TABLET BY MOUTH TWICE DAILY, Disp: 180 tablet, Rfl: 3    NIFEdipine SR (ADALAT CC) 60 mg tablet, Take one tablet by mouth daily., Disp: , Rfl:     other medication, Apply one Dose topically to affected area every evening. Medication Name & Strength (List each active ingredient & strength): Adapalene 3% gel   Route: topical  Frequency (how often): every evening, Disp: , Rfl:     pantoprazole DR (PROTONIX) 40 mg tablet, Take one tablet by mouth daily., Disp: 30 tablet, Rfl: 1    polyethylene glycol 3350 (MIRALAX) 17 g packet, Take one packet by mouth daily., Disp: , Rfl:     predniSONE (DELTASONE) 5 mg tablet, TAKE 1 TABLET BY MOUTH EVERY DAY, Disp: 90 tablet, Rfl: 3    rosuvastatin (CRESTOR) 20 mg tablet, Take one-half tablet by mouth daily., Disp: 15 tablet, Rfl: 1    tacrolimus (PROGRAF) 1 mg capsule, Take one capsule by mouth twice daily., Disp: 180 capsule, Rfl: 3    tamsulosin (FLOMAX) 0.4 mg capsule, Take one capsule by mouth daily after breakfast. Do not crush, chew or open capsules. Take 30 minutes following the same meal each day., Disp: 30 capsule, Rfl: 1    traMADoL (ULTRAM) 50 mg tablet, , Disp: , Rfl:     tretinoin (RETIN-A) 0.05 % topical cream, every 24 hours., Disp: , Rfl:     Physical Exam:  Vitals:    04/13/23 1004 04/13/23 1007   BP: (!) 145/61 132/47   BP Source: Arm, Right Upper Arm, Right Upper   Pulse: 73 69   Temp: 36.6 ?C (97.8 ?  F)    SpO2: 100%    PainSc: Zero    Weight: 103.1 kg (227 lb 6.4 oz)    Height: 167.6 cm (5' 6)      Body mass index is 36.7 kg/m?Marland Kitchen  General: NAD, A+Ox4, calm and pleasant. Appears to be stated age. Walker  HENT: Unremarkable, no oral lesions  Neck: Normal ROM, no LAD, no JVD  Lungs: Bilat. CTA  CV: RRR, S1, S2 without carotid bruit or murmur, no edema  Abdomen: Soft, N/D, N/T without hepatosplenomegaly  Incision: healing  M/S: Normal ROM and strength  Neuro: Nonfocal deficits without tremors  Skin: No skin rash  Psyc: Stable  Dialysis Access: LUE AVF/AVG good thrill/bruit  Has gained significant weight     Laboratory studies:     CMP:      Latest Ref Rng & Units 03/22/2023     9:25 AM 02/17/2023    10:39 AM 01/20/2023    11:10 AM 12/23/2022    11:00 AM 11/25/2022    10:27 AM   CMP   Sodium mmol/L 139  141  140  138  141    Potassium  3.9  4.2  4.3  4.7  4.3    Chloride  106  106  107  108  109    CO2  22.0  24.0  25.0  21.0  23.0    Anion Gap  11  11  8  9  9     Blood Urea Nitrogen 7.0 - 18.7 24.3  21.8  28.5  16.9  27.6    Creatinine 0.57 - 1.11 mg/dl 9.81  1.91 4.78  2.95  1.42    Calcium  10.1  9.6  9.5  9.3  9.8    Total Protein  8.1  7.0  7.6  7.2  7.3    Albumin g/dl 4.2  3.9  4.2  4.0  3.8    Alk Phosphatase  69  63  72  73  64    ALT (SGPT)  18  19  12  24  13     Total Bilirubin mg/dL 6.21  3.08  6.57  8.46  0.23      Hemoglobin A1C   Date Value   04/08/2022 7.7 % (H)   01/21/2022 7.9   07/30/2021 8.3 % (H)     PTH (no units)   Date Value   01/21/2022 51     PTH Hormone (PG/ML)   Date Value   04/08/2022 112.2 (H)   07/30/2021 54.6     Lab Results   Component Value Date    LIPASE 5 (L) 05/16/2021    LIPASE 31 10/01/2019     No results found for: AMY  BK Virus Plasma Quant (no units)   Date Value   01/20/2023 NOT DETECTED   12/23/2022 NOT DETECTED   09/23/2022 NOT DETECTED   09/03/2022 Not Detected   05/27/2022 NOT DETECTED     CMV DNA Quant PCR (no units)   Date Value   03/22/2023 Not Detected   02/17/2023 NOT DETECTED   01/20/2023 NOT DETECTED   12/23/2022 NOT DETECTED   11/25/2022 NOT DETECTED     IU/mL CMV Blood (no units)   Date Value   10/29/2021     <50 IU/mL  The test method detects and quantitates CMV DNA using the Abbott RealTime assay,   and is approved by the FDA for monitoring hematopoietic stem cell transplant   patients who are undergoing anti-CMV therapy.  Please correlate results with the   clinical status of the patient.     10/01/2021     <50 IU/mL  The test method detects and quantitates CMV DNA using the Abbott RealTime assay,   and is approved by the FDA for monitoring hematopoietic stem cell transplant   patients who are undergoing anti-CMV therapy.  Please correlate results with the   clinical status of the patient.     08/05/2021     <50 IU/mL  The test method detects and quantitates CMV DNA using the Abbott RealTime assay,   and is approved by the FDA for monitoring hematopoietic stem cell transplant   patients who are undergoing anti-CMV therapy.  Please correlate results with the   clinical status of the patient.     07/30/2021     <50 IU/mL  The test method detects and quantitates CMV DNA using the Abbott RealTime assay,   and is approved by the FDA for monitoring hematopoietic stem cell transplant   patients who are undergoing anti-CMV therapy.  Please correlate results with the   clinical status of the patient.     07/09/2021     <50 IU/mL  The test method detects and quantitates CMV DNA using the Abbott RealTime assay,   and is approved by the FDA for monitoring hematopoietic stem cell transplant   patients who are undergoing anti-CMV therapy.  Please correlate results with the   clinical status of the patient.       BK Virus Plasma Copies/ML (no units)   Date Value   03/22/2023 Not Detected   02/17/2023 Not Detected   11/25/2022 NOT DETECTED   11/04/2022 Not Detected   08/19/2022 Not Detected     No results found for: EBVDNAQT    TACROLIMUS LEVEL:  Tacrolimus Immunoassay (ng/mL)   Date Value   04/08/2022 9.7   10/29/2021 12.8   10/01/2021 11.2   08/06/2021 14.2   07/30/2021 8.4       CBC with Diff:      Latest Ref Rng & Units 03/17/2023     9:44 AM 02/17/2023    10:39 AM   CBC with Diff   White Blood Cells  5.67  C 6.94    RBC 3.93 - 5.22 x10-6/uL 4.11  C 3.70    Hemoglobin  11.4  C 9.9    Hematocrit 34.1 - 44.9 % 36.8  C 32.0    MCV 79.4 - 94.8 fL 89.5  C 86.5    MCH 25.6 - 32.2 pg 27.7  C 26.8    MCHC 32.2 - 35.5 g/dL 87.5  C 64.3    RDW  32.9  C 14.1    Platelet Count 182 - 369 x10-3/uL 33  C 253    MPV 9.4 - 12.4 13.1  C 10.0    Neutrophils 34.0 - 71.1 % 54.2  C 60.5    Absolute Neutrophil Count 1.56 - 6.13 x10-3/uL 3.08  C 4.20    Absolute Lymph Count 1.18 - 3.74 x10-3/uL 1.92  C 1.97    Absolute Monocyte Count x10-3/uL 0.56  C 0.57    Absolute Eosinophil Count 0.04 - 0.54 x10-3/uL 0.08  C 0.15    Absolute Basophil Count 0.01 - 0.08 x10-3/uL <0.03  C 0.03       C Corrected result     Lab Results   Component Value Date/Time    IRON 70 04/08/2022 10:49 AM    TIBC 295 04/08/2022 10:49 AM  PSAT 24 (L) 04/08/2022 10:49 AM    FERRITIN 123 04/08/2022 10:49 AM    FERRITIN 254.32 (H) 01/21/2022 10:00 AM       Urinalysis:  Lab Results   Component Value Date/Time    UCOLOR Yellow 03/17/2023 09:44 AM    TURBID Clear 03/17/2023 09:44 AM    USPGR 1.025 03/17/2023 09:44 AM    UPH 6.0 03/17/2023 09:44 AM    UPROTEIN Negative 03/17/2023 09:44 AM    UAGLU Negative 03/17/2023 09:44 AM    UKET Negative 03/17/2023 09:44 AM    UBILE Negative 03/17/2023 09:44 AM    UBLD Negative 03/17/2023 09:44 AM    UROB 0.2 03/17/2023 09:44 AM     Protein/CR ratio   Date Value   03/17/2023 0.05966 ML   02/17/2023 0.07177 mg/mg   01/20/2023 0.08753   12/23/2022 0.12306 ML   11/25/2022 3.47425       Imaging:  Results for orders placed during the hospital encounter of 08/05/21    CTA CHEST PULM EMBOLISM W/CONT    Impression  1.  Limited opacification of subsegmental pulmonary arteries without imaging features suggestive of acute pulmonary embolism. No evidence of right heart strain.  2.  Several unchanged scattered bilateral pulmonary nodules, likely scarring/granulomas. Dedicated follow-up is not indicated in the absence of known malignancy.  3.  Nonobstructing left upper pole renal calculus.    By my electronic signature, I attest that I have personally reviewed the images for this examination and formulated the interpretations and opinions expressed in this report      Finalized by Dimple Casey, M.D. on 08/05/2021 8:41 PM. Dictated by Dion Body, M.D. on 08/05/2021 7:58 PM.    Results for orders placed during the hospital encounter of 08/05/21    ABDOMEN 1 VIEW    Impression  Findings/Impression:    2 supine frontal x-ray images of the abdomen/pelvis were obtained. No dilated bowel loops are visualized. Prior cholecystectomy. Mass-effect in the right pelvis region is present due to the known right pelvis renal transplant. There are several tiny foci of calcification projected over the pelvis that most likely represent phleboliths. Calcific arteriosclerosis.      Finalized by Rosilyn Mings, M.D. on 08/05/2021 10:44 AM. Dictated by Rosilyn Mings, M.D. on 08/05/2021 10:42 AM.    Assessment and Plan:  Anira Bloodsaw is a 42 y.o. 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   - the patient has Hx of recurrent UTI and Hx of neurogenic bladder.     #)  BPs:   - Unsure as she does not check her BP      #) Diabetes  - BGs have been labile on BMPs  - on insulin pump  - HgA1C 8.3% today  - follows with Endocrinology- did not tolerate ozempic      #) Hx of CVA   - with residual memory loss and RLE weakness.  - start ASA/ cont Rosuvastatin    #) Weight   - Refer to weight management.      #) Anemia - Hgb average of 10-12s     #) CKD-MBD  - PTH 396 on 05/07/2021  - Vitamin D 16.5 on 11/2 - Completed 25-Vit.D 50 000 unit weekly. Start 2000 international unit(s) once daily  - Ca acceptable    #) Memory issues  - unable to remember meds, timings etc  - uses phone alarm    #) Anxiety/depression  - Cont Wellbutrin XL    #) General health maint. Managed by PCP   Skin cancer surveillance: Annual follow up with Dermatology for skin eval due to long term immunosuppression   Cancer screening: Mammogram, Pap Smear, colonoscopy per guidelines   Annual high dose flu vaccine    Avoid Joseph live vaccines. When clinically indicated or age appropriate, can receive the Shingrix vaccine one year post transplant.      Vaccinated for COVID-19 [Moderna] x 4    I have concerns on her ability to take medications properly and comprehend medical advice. We are focussed on giving simple instructions.  Poor memory. Would benefit form consistent care giver who can accompany her every visit.     RTC 1 month with RN- to address medications.   6 months with me   Labs monthly     Sincerely,    Corena Herter, MD  Transplant Nephrology    Cc: Thomas Alderson  Cc: Christene Lye Darland    Please contact the [facility] at [phone] for any transplant related questions or concerns that may arise.

## 2023-04-15 ENCOUNTER — Encounter: Admit: 2023-04-15 | Discharge: 2023-04-15 | Payer: MEDICARE

## 2023-04-15 ENCOUNTER — Ambulatory Visit: Admit: 2023-04-15 | Discharge: 2023-04-15 | Payer: MEDICARE

## 2023-04-15 DIAGNOSIS — M25561 Pain in right knee: Secondary | ICD-10-CM

## 2023-04-15 MED ORDER — MAGNESIUM OXIDE 400 MG (241.3 MG MAGNESIUM) PO TAB
800 mg | ORAL_TABLET | Freq: Every day | ORAL | 5 refills | Status: AC
Start: 2023-04-15 — End: ?

## 2023-04-15 NOTE — Telephone Encounter
Labs reviewed, blood counts stable, Cr 1.0, within baseline.  Tacrolimus 6, within goal.  UPC too low to quantify.  Hgb A1C 7.3, seeing endocrinology. Bk/CMV neg. DSA not yet resulted. Pt updated via My Chart

## 2023-04-20 ENCOUNTER — Encounter: Admit: 2023-04-20 | Discharge: 2023-04-20 | Payer: MEDICARE

## 2023-04-20 DIAGNOSIS — D849 Immunodeficiency, unspecified: Secondary | ICD-10-CM

## 2023-04-20 DIAGNOSIS — Z940 Kidney transplant status: Secondary | ICD-10-CM

## 2023-04-20 DIAGNOSIS — Z79899 Other long term (current) drug therapy: Secondary | ICD-10-CM

## 2023-04-20 NOTE — Telephone Encounter
TC to DPOA, Tess, requesting patient to stop by [facility] for DSA draw tomorrow, DSA not received by [facility] on prior draw. Order is in, Tess VU to bring [place] to lab tomorrow morning.

## 2023-04-21 ENCOUNTER — Encounter: Admit: 2023-04-21 | Discharge: 2023-04-21 | Payer: MEDICARE

## 2023-04-21 ENCOUNTER — Ambulatory Visit: Admit: 2023-04-21 | Discharge: 2023-04-21 | Payer: MEDICARE

## 2023-04-21 ENCOUNTER — Ambulatory Visit: Admit: 2023-04-21 | Discharge: 2023-04-22 | Payer: MEDICARE

## 2023-04-21 DIAGNOSIS — D849 Immunodeficiency, unspecified: Secondary | ICD-10-CM

## 2023-04-21 DIAGNOSIS — B348 Other viral infections of unspecified site: Secondary | ICD-10-CM

## 2023-04-21 DIAGNOSIS — M549 Dorsalgia, unspecified: Secondary | ICD-10-CM

## 2023-04-21 DIAGNOSIS — E109 Type 1 diabetes mellitus without complications: Secondary | ICD-10-CM

## 2023-04-21 DIAGNOSIS — T8859XA Other complications of anesthesia, initial encounter: Secondary | ICD-10-CM

## 2023-04-21 DIAGNOSIS — E119 Type 2 diabetes mellitus without complications: Secondary | ICD-10-CM

## 2023-04-21 DIAGNOSIS — Z940 Kidney transplant status: Secondary | ICD-10-CM

## 2023-04-21 DIAGNOSIS — I639 Cerebral infarction, unspecified: Secondary | ICD-10-CM

## 2023-04-21 DIAGNOSIS — N289 Disorder of kidney and ureter, unspecified: Secondary | ICD-10-CM

## 2023-04-21 DIAGNOSIS — I10 Essential (primary) hypertension: Secondary | ICD-10-CM

## 2023-04-21 DIAGNOSIS — R413 Other amnesia: Secondary | ICD-10-CM

## 2023-04-21 DIAGNOSIS — K3184 Gastroparesis: Secondary | ICD-10-CM

## 2023-04-21 DIAGNOSIS — E785 Hyperlipidemia, unspecified: Secondary | ICD-10-CM

## 2023-04-21 DIAGNOSIS — Z79899 Other long term (current) drug therapy: Secondary | ICD-10-CM

## 2023-04-21 NOTE — Patient Instructions
Dolan Amen, MD  Celene Skeen, PA-C  [facility] - Phone [phone] - Fax [phone]   [address], [address] - [place], [place] [address]  Charm Rings, RN - Clinical Nurse Coordinator  Dannielle Burn, ATC - Clinical Athletic Trainer    Please call [phone] for follow up appointments

## 2023-04-22 ENCOUNTER — Encounter: Admit: 2023-04-22 | Discharge: 2023-04-22 | Payer: MEDICARE

## 2023-04-28 ENCOUNTER — Encounter: Admit: 2023-04-28 | Discharge: 2023-04-28 | Payer: MEDICARE

## 2023-04-28 MED ORDER — BIOTIN 5 MG PO CAP
1 | ORAL_CAPSULE | Freq: Every day | ORAL | 1 refills | 30.00000 days | Status: AC
Start: 2023-04-28 — End: ?

## 2023-05-04 ENCOUNTER — Encounter: Admit: 2023-05-04 | Discharge: 2023-05-04 | Payer: MEDICARE

## 2023-05-06 ENCOUNTER — Encounter: Admit: 2023-05-06 | Discharge: 2023-05-06 | Payer: MEDICARE

## 2023-05-06 NOTE — Telephone Encounter
Brandy Joseph  Tacy Dura Amb [id number] Endocrinology Nurse (supporting Kem Kays Al-Mubaslat, MD)48 minutes ago (5:36 PM)       I?ve been noticing that every time I try doing things that require more effort, my sugars shoot through roof. Today, leaf blowing?now my sugar is 400. Do you have any suggestions? Really trying to become more active and get this weight off.

## 2023-05-07 NOTE — Telephone Encounter
Person Calling (Patient, Partner, Friend, etc.): PT    Permission to Communicate on File (Yes or No): SELF    Patient Name: Brandy Joseph, Brandy Joseph     DOB: Nov 11, 1980    Reason for Call: Elevated blood Sugar - coming down, now 370     Onset of Symptoms: Today, pt reports when she does manual activity her BS goes up. She did leaf blowing, BS was almost 400.     Summary: Pt has taken 4u of Humalog, 4 mg pf Humalog then 5 u of Humalog = total of 13 u of Humalog.    Disposition Given: Advised pt to go to ED. Pt is going to check her ketones, will make her decision after that. If she goes it would be to [facility] ED.     Following Disposition (Yes or No): maybe       Reason for Disposition   Blood glucose > 240 mg/dL (81.1 mmol/L) AND vomiting AND unable to check for ketones (in blood or urine)    Answer Assessment - Initial Assessment Questions  1. BLOOD GLUCOSE: What is your blood glucose level?       370 most recent , while on phone with RN.  2. ONSET: When did you check the blood glucose?      05/06/23  3. USUAL RANGE: What is your glucose level usually? (e.g., usual fasting morning value, usual evening value)      Pt reported she has been working on her range, but, this is very high, even for her.   4. KETONES: Do you check for ketones (urine or blood test strips)? If yes, ask: What does the test show now?       Py will check  5. TYPE 1 or 2:  Do you know what type of diabetes you have?  (e.g., Type 1, Type 2, Gestational; doesn't know)       TYPE 1  6. INSULIN: Do you take insulin? What type of insulin(s) do you use? What is the mode of delivery? (syringe, pen; injection or pump)?       Humalog  7. DIABETES PILLS: Do you take any pills for your diabetes? If yes, ask: Have you missed taking any pills recently?      denied  8. OTHER SYMPTOMS: Do you have any symptoms? (e.g., fever, frequent urination, difficulty breathing, dizziness, weakness, vomiting)      None of the listed symptoms  9. PREGNANCY: Is there any chance you are pregnant? When was your last menstrual period?    Protocols used: Diabetes - High Blood Sugar-A-OH

## 2023-05-13 ENCOUNTER — Encounter: Admit: 2023-05-13 | Discharge: 2023-05-13 | Payer: MEDICARE

## 2023-05-13 DIAGNOSIS — D849 Immunodeficiency, unspecified: Secondary | ICD-10-CM

## 2023-05-13 DIAGNOSIS — Z940 Kidney transplant status: Secondary | ICD-10-CM

## 2023-05-13 DIAGNOSIS — Z79899 Other long term (current) drug therapy: Secondary | ICD-10-CM

## 2023-05-14 ENCOUNTER — Ambulatory Visit: Admit: 2023-05-14 | Discharge: 2023-05-15 | Payer: MEDICARE

## 2023-05-14 ENCOUNTER — Encounter: Admit: 2023-05-14 | Discharge: 2023-05-14 | Payer: MEDICARE

## 2023-05-17 ENCOUNTER — Encounter: Admit: 2023-05-17 | Discharge: 2023-05-17 | Payer: MEDICARE

## 2023-05-17 DIAGNOSIS — Z79899 Other long term (current) drug therapy: Secondary | ICD-10-CM

## 2023-05-17 DIAGNOSIS — Z940 Kidney transplant status: Secondary | ICD-10-CM

## 2023-05-17 DIAGNOSIS — D849 Immunodeficiency, unspecified: Secondary | ICD-10-CM

## 2023-05-17 LAB — BK VIRUS QUANT PLASMA

## 2023-05-18 ENCOUNTER — Encounter: Admit: 2023-05-18 | Discharge: 2023-05-18 | Payer: MEDICARE

## 2023-05-20 ENCOUNTER — Encounter: Admit: 2023-05-20 | Discharge: 2023-05-20 | Payer: MEDICARE

## 2023-06-09 ENCOUNTER — Encounter: Admit: 2023-06-09 | Discharge: 2023-06-09 | Payer: MEDICARE

## 2023-06-09 DIAGNOSIS — Z79899 Other long term (current) drug therapy: Secondary | ICD-10-CM

## 2023-06-09 DIAGNOSIS — D849 Immunodeficiency, unspecified: Secondary | ICD-10-CM

## 2023-06-09 DIAGNOSIS — Z940 Kidney transplant status: Secondary | ICD-10-CM

## 2023-06-10 ENCOUNTER — Encounter: Admit: 2023-06-10 | Discharge: 2023-06-10 | Payer: MEDICARE

## 2023-06-10 DIAGNOSIS — Z79899 Other long term (current) drug therapy: Secondary | ICD-10-CM

## 2023-06-10 DIAGNOSIS — D849 Immunodeficiency, unspecified: Secondary | ICD-10-CM

## 2023-06-10 DIAGNOSIS — Z940 Kidney transplant status: Secondary | ICD-10-CM

## 2023-06-10 LAB — CBC AND DIFF
ABSOLUTE EOS COUNT: 0.1 uL (ref 0.04–0.54)
ABSOLUTE LYMPH COUNT: 2.1 uL (ref 1.18–3.74)
ABSOLUTE MONO COUNT: 0.8 uL (ref 0.24–0.86)
ABSOLUTE NEUTROPHIL: 5.3 uL (ref 1.56–6.13)
BASOPHILS: 0.2 %
EOSINOPHIL %: 1.9 % (ref 0.7–7.0)
HEMATOCRIT: 36 % (ref 34.1–44.9)
HEMOGLOBIN: 10 — ABNORMAL LOW (ref 11.2–15.7)
LYMPHOCYTES: 25 % (ref 3–53.1)
MCH: 28 pg (ref 25.6–32.2)
MCHC: 29 — ABNORMAL LOW (ref 32.2–35.5)
MCV: 97 — ABNORMAL HIGH (ref 79.4–94)
MONOCYTES %: 9.7 % (ref 4.7–12.15)
MPV: 11 (ref 9.4–12.4)
NEUTROPHILS %: 62 % (ref 34.0–71.1)
PLATELET COUNT: 126 uL — ABNORMAL LOW (ref 182–369)
RBC COUNT: 3.6 uL — ABNORMAL LOW (ref 3.93–5.22)
RDW: 13
WBC COUNT: 8.5

## 2023-06-11 ENCOUNTER — Encounter: Admit: 2023-06-11 | Discharge: 2023-06-11 | Payer: MEDICARE

## 2023-06-14 ENCOUNTER — Encounter: Admit: 2023-06-14 | Discharge: 2023-06-14 | Payer: MEDICARE

## 2023-06-14 DIAGNOSIS — Z79899 Other long term (current) drug therapy: Secondary | ICD-10-CM

## 2023-06-14 DIAGNOSIS — Z940 Kidney transplant status: Secondary | ICD-10-CM

## 2023-06-14 DIAGNOSIS — D849 Immunodeficiency, unspecified: Secondary | ICD-10-CM

## 2023-06-21 ENCOUNTER — Encounter: Admit: 2023-06-21 | Discharge: 2023-06-21 | Payer: MEDICARE

## 2023-07-09 ENCOUNTER — Encounter: Admit: 2023-07-09 | Discharge: 2023-07-09 | Payer: MEDICARE

## 2023-07-09 DIAGNOSIS — Z940 Kidney transplant status: Secondary | ICD-10-CM

## 2023-07-12 ENCOUNTER — Encounter: Admit: 2023-07-12 | Discharge: 2023-07-12 | Payer: MEDICARE

## 2023-07-12 DIAGNOSIS — Z940 Kidney transplant status: Secondary | ICD-10-CM

## 2023-07-28 ENCOUNTER — Encounter: Admit: 2023-07-28 | Discharge: 2023-07-28 | Payer: MEDICARE

## 2023-08-12 ENCOUNTER — Encounter: Admit: 2023-08-12 | Discharge: 2023-08-12 | Payer: MEDICARE

## 2023-08-23 ENCOUNTER — Encounter: Admit: 2023-08-23 | Discharge: 2023-08-23 | Payer: MEDICARE

## 2023-08-27 ENCOUNTER — Encounter: Admit: 2023-08-27 | Discharge: 2023-08-27 | Payer: MEDICARE

## 2023-08-30 ENCOUNTER — Encounter: Admit: 2023-08-30 | Discharge: 2023-08-30 | Payer: MEDICARE

## 2023-08-30 DIAGNOSIS — Z79899 Other long term (current) drug therapy: Secondary | ICD-10-CM

## 2023-08-30 DIAGNOSIS — E782 Mixed hyperlipidemia: Secondary | ICD-10-CM

## 2023-08-30 DIAGNOSIS — D849 Immunodeficiency, unspecified: Secondary | ICD-10-CM

## 2023-08-30 DIAGNOSIS — Z940 Kidney transplant status: Secondary | ICD-10-CM

## 2023-08-30 DIAGNOSIS — R739 Hyperglycemia, unspecified: Secondary | ICD-10-CM

## 2023-08-30 NOTE — Progress Notes
 TC to [facility] to obtain fax #- [phone]. Orders faxed.

## 2023-09-01 ENCOUNTER — Encounter: Admit: 2023-09-01 | Discharge: 2023-09-01 | Payer: MEDICARE

## 2023-09-02 ENCOUNTER — Encounter: Admit: 2023-09-02 | Discharge: 2023-09-02 | Payer: MEDICARE

## 2023-09-02 DIAGNOSIS — Z940 Kidney transplant status: Secondary | ICD-10-CM

## 2023-09-03 ENCOUNTER — Encounter: Admit: 2023-09-03 | Discharge: 2023-09-03 | Payer: MEDICARE

## 2023-09-06 ENCOUNTER — Encounter: Admit: 2023-09-06 | Discharge: 2023-09-06 | Payer: MEDICARE

## 2023-09-07 ENCOUNTER — Encounter: Admit: 2023-09-07 | Discharge: 2023-09-07 | Payer: MEDICARE

## 2023-09-17 ENCOUNTER — Encounter: Admit: 2023-09-17 | Discharge: 2023-09-17 | Payer: MEDICARE

## 2023-09-17 MED ORDER — FEROSUL 325 MG (65 MG IRON) PO TAB
325 mg | ORAL_TABLET | Freq: Every day | ORAL | 0 refills | Status: AC
Start: 2023-09-17 — End: ?

## 2023-09-27 ENCOUNTER — Encounter: Admit: 2023-09-27 | Discharge: 2023-09-27 | Payer: MEDICARE

## 2023-10-07 ENCOUNTER — Encounter: Admit: 2023-10-07 | Discharge: 2023-10-07 | Payer: MEDICARE

## 2023-10-11 ENCOUNTER — Encounter: Admit: 2023-10-11 | Discharge: 2023-10-11 | Payer: MEDICARE

## 2023-10-12 ENCOUNTER — Encounter: Admit: 2023-10-12 | Discharge: 2023-10-12 | Payer: MEDICARE

## 2023-10-12 DIAGNOSIS — N183 Stage 3 chronic kidney disease, unspecified whether stage 3a or 3b CKD (CMS-HCC): Secondary | ICD-10-CM

## 2023-10-12 DIAGNOSIS — Z940 Kidney transplant status: Secondary | ICD-10-CM

## 2023-10-13 ENCOUNTER — Encounter: Admit: 2023-10-13 | Discharge: 2023-10-13 | Payer: MEDICARE

## 2023-10-13 ENCOUNTER — Ambulatory Visit: Admit: 2023-10-13 | Discharge: 2023-10-14 | Payer: MEDICARE

## 2023-10-14 ENCOUNTER — Encounter: Admit: 2023-10-14 | Discharge: 2023-10-14 | Payer: MEDICARE

## 2023-10-14 DIAGNOSIS — Z940 Kidney transplant status: Secondary | ICD-10-CM

## 2023-10-14 DIAGNOSIS — D849 Immunodeficiency, unspecified: Secondary | ICD-10-CM

## 2023-10-14 DIAGNOSIS — E782 Mixed hyperlipidemia: Secondary | ICD-10-CM

## 2023-10-15 ENCOUNTER — Encounter: Admit: 2023-10-15 | Discharge: 2023-10-15 | Payer: MEDICARE

## 2023-10-15 NOTE — Telephone Encounter
 Pt called in stating she missed morning dose of medications. Pt asking if she needs to take medications now or wait. Pt takes medication at 8 am/8 pm. Instructed not to take morning medications now, take night time medications. Pt verbalized understanding.

## 2023-10-18 ENCOUNTER — Encounter: Admit: 2023-10-18 | Discharge: 2023-10-18 | Payer: MEDICARE

## 2023-10-22 ENCOUNTER — Encounter: Admit: 2023-10-22 | Discharge: 2023-10-22 | Payer: MEDICARE

## 2023-10-22 ENCOUNTER — Ambulatory Visit: Admit: 2023-10-22 | Discharge: 2023-10-23 | Payer: MEDICARE

## 2023-10-22 NOTE — Progress Notes
 [facility] Telehealth Visit  Clinical Nutrition Assessment Summary       Met with pt in telehealth clinic.                                                      Lab Results   Component Value Date/Time    HGBA1C 6.9 07/29/2023 12:00 AM    HGBA1C 7.3 (H) 04/13/2023 10:40 AM    HGBA1C 7.7 (H) 04/08/2022 10:49 AM     Magnesium   Date Value Ref Range Status   10/08/2023 1.7 1.6 - 2.6 mg/dL Corrected       Wt Readings from Last 8 Encounters:   10/13/23 106.5 kg (234 lb 12.8 oz)   07/28/23 91.6 kg (202 lb)   04/28/23 102.1 kg (225 lb)   04/13/23 103.1 kg (227 lb 6.4 oz)   01/27/23 101.2 kg (223 lb)   12/16/22 103.3 kg (227 lb 12.8 oz)   09/09/22 96.5 kg (212 lb 11.9 oz)   09/30/22 105.2 kg (232 lb)       Date of Transplant: 05/07/21    Met with pt today via telehealth clinic. Patient is wanting to work on weight loss, but continues to struggle despite decreasing caloric intake. Pt also has a insulin pump and has an episode of hypoglycemia daily. Pt reports there is no consistency to when it is, and she reports it's not related to a decrease in PO intake. Noted Endo prescribed pt tirzepatide yesterday, patient was not able to tolerate Ozempic d/t GI symptoms. Per diet recall she had 1 slice of bacon and 1 egg for breakfast, she could not remember lunch but it is typically light, dinner was stir fry with chicken, vegetables, some noodles and sauce. Her snacks depend on her blood sugars. When she has a low blood sugar she will eat candy, a spoon of pb or a banana. She wants to know what she can do for low blood sugar when she is trying to work on weight loss. Discussed applesauce or glucose tabs, pt does not like the taste of glucose tabs. She drinks coffee in the am with sf syrup, otherwise she drinks water. She is unable to exercise right now, she was injured in PT and hurt her knee. 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 Discussed trying chair exercises. Encouraged her to start with a balanced diet, goal of min 45g of carbs each meal. Also discussed working on increasing her protein intake. Pt is not a big meat fan and doesn't think she consumes adequate protein intake. RD to mail pt handout on DM meals, protein content of food, and chair exercises. Scheduled 3 week follow up.        Goals: 45g CHO per meal, increase protein intake, chair exercises       Parris Bolognese, MS, RD, CSR, LD, CCTD, CNSC  Office: [phone]   Available on [employer]

## 2023-10-25 ENCOUNTER — Encounter: Admit: 2023-10-25 | Discharge: 2023-10-25 | Payer: MEDICARE

## 2023-10-26 ENCOUNTER — Ambulatory Visit: Admit: 2023-10-26 | Discharge: 2023-10-27 | Payer: MEDICARE

## 2023-10-26 ENCOUNTER — Encounter: Admit: 2023-10-26 | Discharge: 2023-10-26 | Payer: MEDICARE

## 2023-10-26 DIAGNOSIS — E103592 Type 1 diabetes mellitus with proliferative diabetic retinopathy without macular edema, left eye: Secondary | ICD-10-CM

## 2023-10-26 NOTE — Progress Notes
 Body mass index is 37.77 kg/m?.    OCT:  OD: focal outer retinal atrophy  OS: cannot be obtained    B-scan:  OS: vit hge, no rd, no rt, no choroidal mass lesion       Assessment and Plan:         Referral for hemorrhage OS from [facility].    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  Will f/u with own pcp    No other health issues                 1- vit hge OS  Likley 2ry to PDR  B scan vit hge, no rd, no rt, no choroidal mass lesion  R/b/a discussed  Patient does not want AntiaVGEF due to her previsou CVA nad risk of CVA  Monitor closely and plan for PRP fill in once more vit hge resorbs    2- PDR OU post prp  Stress importance of glycemic and systemic control.  Monitor closely    Signs and symptoms of retinal tears and retinal detachment were reviewed in details with the patient. Patient was instructed to immediately present for evaluation or seek medical help if increased flashes, increased floaters, any decrease in vision, or curtain in field of vision.    Monocular precautions    F/u 39m or sooner prn  Oct, optos    Husband was present

## 2023-10-27 DIAGNOSIS — E103591 Type 1 diabetes mellitus with proliferative diabetic retinopathy without macular edema, right eye: Secondary | ICD-10-CM

## 2023-10-27 DIAGNOSIS — H4312 Vitreous hemorrhage, left eye: Secondary | ICD-10-CM

## 2023-11-01 ENCOUNTER — Encounter: Admit: 2023-11-01 | Discharge: 2023-11-01 | Payer: MEDICARE

## 2023-11-02 ENCOUNTER — Encounter: Admit: 2023-11-02 | Discharge: 2023-11-02 | Payer: MEDICARE

## 2023-11-12 ENCOUNTER — Encounter: Admit: 2023-11-12 | Discharge: 2023-11-12 | Payer: MEDICARE

## 2023-11-16 ENCOUNTER — Encounter: Admit: 2023-11-16 | Discharge: 2023-11-16 | Payer: MEDICARE

## 2023-11-18 ENCOUNTER — Encounter: Admit: 2023-11-18 | Discharge: 2023-11-18 | Payer: MEDICARE

## 2023-11-18 NOTE — Telephone Encounter
 Pt called regarding she took her medication twice this morning and asking for recommendations. Please call her back.

## 2023-11-18 NOTE — Telephone Encounter
 Returned patient call- states she took all of her morning medications twice. Patient rechecked BP, 110/60. C/o diarrhea. Discussed with provider- patient to hold all PM meds. Returned patient call with instructions to hold all pm meds, pt to notify transplant center with any symptoms or decrease blood pressure. Patient VU.

## 2023-11-24 ENCOUNTER — Encounter: Admit: 2023-11-24 | Discharge: 2023-11-24 | Payer: MEDICARE

## 2023-11-26 ENCOUNTER — Encounter: Admit: 2023-11-26 | Discharge: 2023-11-26 | Payer: MEDICARE

## 2023-11-26 DIAGNOSIS — N183 Stage 3 chronic kidney disease, unspecified whether stage 3a or 3b CKD (CMS-HCC): Secondary | ICD-10-CM

## 2023-11-26 DIAGNOSIS — E782 Mixed hyperlipidemia: Secondary | ICD-10-CM

## 2023-11-26 DIAGNOSIS — Z940 Kidney transplant status: Secondary | ICD-10-CM

## 2023-11-30 ENCOUNTER — Encounter: Admit: 2023-11-30 | Discharge: 2023-11-30 | Payer: MEDICARE

## 2023-12-01 ENCOUNTER — Encounter: Admit: 2023-12-01 | Discharge: 2023-12-01 | Payer: MEDICARE

## 2023-12-01 MED ORDER — LEVOFLOXACIN 750 MG PO TAB
750 mg | ORAL_TABLET | Freq: Every day | ORAL | 0 refills | 7.00000 days | Status: AC
Start: 2023-12-01 — End: ?

## 2023-12-02 ENCOUNTER — Encounter: Admit: 2023-12-02 | Discharge: 2023-12-02 | Payer: MEDICARE

## 2023-12-02 DIAGNOSIS — Z940 Kidney transplant status: Secondary | ICD-10-CM

## 2023-12-02 DIAGNOSIS — N183 Stage 3 chronic kidney disease, unspecified whether stage 3a or 3b CKD (CMS-HCC): Secondary | ICD-10-CM

## 2023-12-02 DIAGNOSIS — D849 Immunodeficiency, unspecified: Secondary | ICD-10-CM

## 2023-12-07 ENCOUNTER — Encounter: Admit: 2023-12-07 | Discharge: 2023-12-07 | Payer: MEDICARE

## 2023-12-13 ENCOUNTER — Encounter: Admit: 2023-12-13 | Discharge: 2023-12-13 | Payer: MEDICARE

## 2023-12-13 MED ORDER — CARVEDILOL 25 MG PO TAB
ORAL_TABLET | ORAL | 3 refills | 90.00000 days | Status: AC
Start: 2023-12-13 — End: ?

## 2023-12-13 MED ORDER — MAGNESIUM OXIDE 400 MG (241.3 MG MAGNESIUM) PO TAB
800 mg | ORAL_TABLET | Freq: Every day | ORAL | 3 refills | Status: AC
Start: 2023-12-13 — End: ?

## 2023-12-17 ENCOUNTER — Encounter: Admit: 2023-12-17 | Discharge: 2023-12-17 | Payer: MEDICARE

## 2023-12-20 ENCOUNTER — Encounter: Admit: 2023-12-20 | Discharge: 2023-12-20 | Payer: MEDICARE

## 2023-12-20 NOTE — Telephone Encounter
 recently got a refill of tacrolimus and her pharmacy substituted for generic. Pharmacy states the only difference is metchacrylic acid is substituted with ethyl acrylate copoloymer. She is experiencing stomach acid/extreme burning in chest and throat. Currently on pantoprazole 40 mg daily, Dr Charlanne approved increase to bid until symptoms improve. Can try OTC tums PRN.    Routed to pharmacists Rex and Izetta- are you familiar with this side effect for the substitution mentioned? I suggested patient contact pharmacy to get brand name due to this SE

## 2023-12-28 ENCOUNTER — Encounter: Admit: 2023-12-28 | Discharge: 2023-12-28 | Payer: MEDICARE

## 2023-12-29 ENCOUNTER — Encounter: Admit: 2023-12-29 | Discharge: 2023-12-29 | Payer: MEDICARE

## 2023-12-31 ENCOUNTER — Encounter: Admit: 2023-12-31 | Discharge: 2023-12-31 | Payer: MEDICARE

## 2023-12-31 DIAGNOSIS — Z940 Kidney transplant status: Secondary | ICD-10-CM

## 2023-12-31 DIAGNOSIS — Z79899 Other long term (current) drug therapy: Secondary | ICD-10-CM

## 2023-12-31 DIAGNOSIS — D849 Immunodeficiency, unspecified: Principal | ICD-10-CM

## 2023-12-31 LAB — CBC AND DIFF
ABSOLUTE EOS COUNT: 0.1
ABSOLUTE LYMPH COUNT: 2.3
ABSOLUTE MONO COUNT: 0.6
ABSOLUTE NEUTROPHIL: 3.5
BASOPHILS: 0.3
EOSINOPHIL %: 1.8
HEMATOCRIT: 36
HEMOGLOBIN: 11
LYMPHOCYTES: 34
MCH: 29
MCHC: 31 — ABNORMAL LOW (ref 32.2–35.5)
MCV: 91
MONOCYTES %: 9.8
MPV: 10
NEUTROPHILS %: 53
PLATELET COUNT: 247
RBC COUNT: 4
RDW: 13
WBC COUNT: 6.6

## 2023-12-31 LAB — PROTEIN/CR RATIO,UR RAN
PROT CREAT RAT/CAL: 0.1
UR CREATININE, RAN: 147 — ABNORMAL HIGH (ref 47–110)
UR TOTAL PROTEIN,RAN: 10

## 2024-01-03 ENCOUNTER — Encounter: Admit: 2024-01-03 | Discharge: 2024-01-03 | Payer: MEDICARE

## 2024-01-03 DIAGNOSIS — D849 Immunodeficiency, unspecified: Principal | ICD-10-CM

## 2024-01-03 DIAGNOSIS — Z940 Kidney transplant status: Secondary | ICD-10-CM

## 2024-01-03 DIAGNOSIS — Z79899 Other long term (current) drug therapy: Secondary | ICD-10-CM

## 2024-01-03 LAB — PARATHYROID HORMONE: PTH: 49 pg/mL

## 2024-01-03 LAB — TACROLIMUS LC-MS/MS: TACROLIMUS LC-MS/MS: 10

## 2024-01-03 LAB — BK VIRUS QUANT PLASMA

## 2024-01-03 LAB — LIPID PROFILE
CHOLESTEROL/HDL %: 3
CHOLESTEROL: 146 mg/dL (ref <200)
HDL: 47 mg/dL (ref >=40)
LDL: 85
TRIGLYCERIDES: 72 mg/dL (ref <150)
VLDL: 14

## 2024-01-04 ENCOUNTER — Encounter: Admit: 2024-01-04 | Discharge: 2024-01-04 | Payer: MEDICARE

## 2024-01-14 ENCOUNTER — Encounter: Admit: 2024-01-14 | Discharge: 2024-01-14 | Payer: MEDICARE

## 2024-01-14 DIAGNOSIS — Z940 Kidney transplant status: Principal | ICD-10-CM

## 2024-01-14 MED ORDER — TACROLIMUS 1 MG PO CAP
1 mg | ORAL_CAPSULE | Freq: Every morning | ORAL | 3 refills | 30.00000 days | Status: AC
Start: 2024-01-14 — End: ?

## 2024-01-14 MED ORDER — TACROLIMUS 0.5 MG PO CAP
.5 mg | ORAL_CAPSULE | Freq: Every evening | ORAL | 3 refills | 30.00000 days | Status: AC
Start: 2024-01-14 — End: ?

## 2024-01-14 NOTE — Telephone Encounter
 Tacrolimus  10.4 LCMS, above goal, on most recent labs. Received order to decrease tacrolimus  to 1 mg in the morning and 0.5 mg in the evening. Patient notified via [facility].

## 2024-01-27 ENCOUNTER — Encounter: Admit: 2024-01-27 | Discharge: 2024-01-27 | Payer: MEDICARE

## 2024-01-27 DIAGNOSIS — Z940 Kidney transplant status: Principal | ICD-10-CM

## 2024-01-27 DIAGNOSIS — N183 Stage 3 chronic kidney disease, unspecified whether stage 3a or 3b CKD (CMS-HCC): Secondary | ICD-10-CM

## 2024-01-27 DIAGNOSIS — E782 Mixed hyperlipidemia: Secondary | ICD-10-CM

## 2024-01-28 ENCOUNTER — Encounter: Admit: 2024-01-28 | Discharge: 2024-01-28 | Payer: MEDICARE

## 2024-01-31 ENCOUNTER — Encounter: Admit: 2024-01-31 | Discharge: 2024-01-31 | Payer: MEDICARE

## 2024-02-02 ENCOUNTER — Encounter: Admit: 2024-02-02 | Discharge: 2024-02-02 | Payer: MEDICARE

## 2024-02-18 ENCOUNTER — Encounter: Admit: 2024-02-18 | Discharge: 2024-02-18 | Payer: MEDICARE

## 2024-02-19 ENCOUNTER — Ambulatory Visit: Admit: 2024-02-19 | Discharge: 2024-02-20 | Payer: MEDICARE

## 2024-02-19 ENCOUNTER — Encounter: Admit: 2024-02-19 | Discharge: 2024-02-19 | Payer: MEDICARE

## 2024-02-19 MED ORDER — AMITRIPTYLINE 25 MG PO TAB
25 mg | ORAL_TABLET | Freq: Every day | ORAL | 3 refills | 30.00000 days | Status: AC
Start: 2024-02-19 — End: ?

## 2024-02-19 MED ORDER — UBRELVY 50 MG PO TAB
50 mg | ORAL_TABLET | Freq: Every day | ORAL | 3 refills | 30.00000 days | Status: AC | PRN
Start: 2024-02-19 — End: ?

## 2024-02-19 NOTE — Progress Notes
 Date of Service: 02/19/2024    Referral Physician: Charlanne Roughen, MD     Subjective:             Brandy Joseph is a 43 y.o. female with headaches.     History of Present Illness  Brandy Joseph 43 y.o. female with history of DM, HLD, HTN, ESRD s/p transplant and hemorrhagic stroke and migraine who presented to neurology for evaluation.      She has had hemorrhagic stroke ~7 years ago. She has dealt with them on and off for most of her life. This has gotten worse overtime and in the last three month she gets them frequently. She has been taking tylenol , tramadol  and essential oils. The headaches are located in the frontal region worse on the right and occasionally posterior head region. Its throbbing and pressure and has associated photophobia, mild phonophobia and nausea. She denies visual auras. She gets these headache about 15-20 days a month. She had tried magnesium . 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 She denies neck pain and stiffness.     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 Right frontal extraventricular drain with an unchanged asymmetrically distended left lateral ventricle. Supratentorial white matter FLAIR hyperintensities including old small infarcts within the left basal ganglia and bilateral thalami most likely related to chronic microvascular ischemic changes.     Past Medical History:    Arthritis    Back pain    BK viremia    Cataract    Complication of anesthesia    Diabetes mellitus type 1 (CMS-HCC)    DM (diabetes mellitus) (CMS-HCC)    Gastroparesis    Hyperlipemia    Hypertension    Kidney disease    Memory loss    Migraines    Retinal detachment    Seasonal allergic reaction    Seizures (CMS-HCC)    Stroke (CMS-HCC)         Surgical History:   Procedure Laterality Date    ANKLE SURGERY Left 1996    FIBULA FRACTURE SURGERY Left 1996    ESOPHAGOGASTRODUODENOSCOPY WITH BIOPSY - FLEXIBLE N/A 03/29/2018    Performed by Priscella Squires, MD at [facility] ENDO    ESOPHAGOGASTRODUODENOSCOPY WITH BIOPSY - FLEXIBLE N/A 10/04/2019    Performed by Malissa Norleen LABOR, MD at [facility] ENDO    COLONOSCOPY WITH BIOPSY - FLEXIBLE N/A 10/04/2019    Performed by Malissa Norleen LABOR, MD at [facility] ENDO    ANGIOGRAPHY CORONARY ARTERY WITH LEFT HEART CATHETERIZATION N/A 01/18/2020    Performed by Erling Camellia RAMAN, MD at [facility] CATH LAB    POSSIBLE PERCUTANEOUS CORONARY STENT PLACEMENT WITH ANGIOPLASTY N/A 01/18/2020    Performed by Erling Camellia RAMAN, MD at [facility] CATH LAB    COLONOSCOPY WITH BIOPSY - FLEXIBLE N/A 05/15/2020    Performed by Noralyn Blossom, MD at [facility] ENDO    COLONOSCOPY WITH SNARE REMOVAL TUMOR/ POLYP/ OTHER LESION  05/15/2020    Performed by Noralyn Blossom, MD at [facility] ENDO    ALLOTRANSPLANTATION KIDNEY FROM NON LIVING DONOR WITHOUT RECIPIENT NEPHRECTOMY N/A 05/07/2021    Performed by Terrie Sieving, MD at [facility] OR    BURR HOLE      release pressure from stroke    DIALYSIS FISTULA CREATION      GALLBLADDER SURGERY      HX CESAREAN SECTION      HX EYE SURGERY  03/2020  Retina bleed    HX RETINAL LASER  Unknown    KIDNEY SURGERY      KIDNEY TRANSPLANT      RETINAL DETACHMENT REPAIR  Unknown    SINUS SURGERY  Unknown    Had nodules removed         Social History     Socioeconomic History    Marital status: Divorced    Number of children: 1    Highest education level: Associate degree: occupational, Scientist, product/process development, or vocational program   Tobacco Use    Smoking status: Never    Smokeless tobacco: Never    Tobacco comments:     Several people in my famiky smoke, so ive been exposed   Vaping Use    Vaping status: Never Used   Substance and Sexual Activity    Alcohol use: Yes     Alcohol/week: 3.0 standard drinks of alcohol     Types: 3 Glasses of wine per week     Comment: occasional glass of wine    Drug use: Never    Sexual activity: Yes     Partners: Male     Birth control/protection: Surgical         Family History   Problem Relation Name Age of Onset    Stroke Mother Mom         Knee surgery led to stroke    Diabetes Paternal Grandmother Cy Rattler     Heart problem Paternal Grandmother Cy Rattler     Hypertension Paternal Grandmother Cy Rattler     Tumor Maternal Grandmother Meade Pinal         brain    Cancer Maternal Grandmother Meade Pinal         Brain tumor    None Reported Son      Diabetes Grandparent Cy Rattler     Cancer Grandparent Meade Pinal         Stage 4 Brain tumor    Cancer Grandparent Nada Rattler         Alzheimers    Blood Clots Mother Dorthea Rattler         She died of this    COPD Mother Dorthea Rattler     Blood Clots Paternal Grandmother Cy Rattler         Diabetes    Blood Clots Paternal Grandfather Nada Rattler         Hypoglycemia       Allergies[1]         Review of Systems      Objective:          ACCU-CHEK GUIDE TEST STRIPS test strip USE TO TEST SUGARS SIX TIMES DAILY    acetaminophen  (TYLENOL  PO) Take  by mouth every 6 hours as needed (headache, pain).    amitriptyline  (ELAVIL ) 25 mg tablet Take one tablet by mouth daily.    amLODIPine  (NORVASC ) 10 mg tablet Take one tablet by mouth daily.    aspirin  EC (ASPIR-LOW) 81 mg tablet Take one tablet by mouth daily. Take with food.    Biotin  5 mg cap TAKE 1 CAPSULE BY MOUTH EVERY DAY    buPROPion  XL (WELLBUTRIN  XL) 150 mg tablet Take one tablet by mouth every morning. Do not crush or chew.    carvediloL  (COREG ) 25 mg tablet TAKE 1 AND 1/2 TABLETS BY MOUTH TWICE DAILY WITH FOOD    cetirizine  (ZYRTEC ) 10 mg tablet Take one tablet by mouth daily.    CHOLEcalciferoL  (vitamin D3) 50  mcg (2,000 unit) tablet TAKE 1 TABLET BY MOUTH EVERY DAY    cloNIDine  HCL (CATAPRES ) 0.2 mg tablet Take one tablet by mouth twice daily.    ferrous sulfate  (FEROSUL) 325 mg (65 mg iron ) tablet TAKE 1 TABLET BY MOUTH EVERY DAY    furosemide  (LASIX  PO) Lasix     insulin  lispro (U-100) (HUMALOG  KWIKPEN INSULIN ) 100 unit/mL subcutaneous PEN USE WITH INSULIN  PUMP MAX DAILY DOSE OF 70 UNITS **TARGET RANGE 110-130**    Insulin  Syringe-Needle U-100 (BD INSULIN  SYRINGE ULTRA-FINE) 1 mL 31 gauge x 5/16 syrg Use with insulin  vials as directed    lidocaine  (LIDODERM ) 5 % topical patch Apply one patch topically to affected area every 24 hours as needed for Pain.    lisinopril  (ZESTRIL ) 5 mg tablet Take one tablet by mouth at bedtime daily. Indications: high blood pressure    magnesium  oxide (MAGOX) 400 mg (241.3 mg magnesium ) tablet TAKE 2 TABLETS BY MOUTH DAILY    MOUNJARO 5 mg/0.5 mL injector EN INJECT CONTENTS OF 1 SYRINGE SUBCUTANEOUSLY EVERY 7 DAYS    mycophenolate  sodium (MYFORTIC ) 180 mg tablet,delayed release TAKE 1 TABLET BY MOUTH TWICE DAILY    NIFEdipine SR (ADALAT CC) 60 mg tablet Take one tablet by mouth daily.    pantoprazole  DR (PROTONIX ) 40 mg tablet Take one tablet by mouth daily.    predniSONE  (DELTASONE ) 5 mg tablet TAKE 1 TABLET BY MOUTH EVERY DAY    rosuvastatin  (CRESTOR ) 20 mg tablet Take one-half tablet by mouth daily.    tacrolimus  (PROGRAF ) 0.5 mg capsule Take one capsule by mouth every evening. Indications: prevent kidney transplant rejection, Z94.0    tacrolimus  (PROGRAF ) 1 mg capsule Take one capsule by mouth every morning. Indications: prevent kidney transplant rejection, Z94.0    tamsulosin  (FLOMAX ) 0.4 mg capsule Take one capsule by mouth daily after breakfast. Do not crush, chew or open capsules. Take 30 minutes following the same meal each day.    ubrogepant  (UBRELVY ) 50 mg tablet Take one tablet by mouth daily as needed. May repeat once after 2 hours based on response.     Vitals:    02/19/24 0943   BP: 121/82   BP Source: Arm, Right Upper   Pulse: 82   SpO2: 98%   PainSc: Four   Height: 167.6 cm (5' 6)     Body mass index is 37.93 kg/m?SABRA     Physical Exam  General: no acute distress, cooperative    HEENT: normocephalic, atraumatic, fundi benign   CV: well perfused  Pulm: equal chest rise, non labored breathing  Ext: no swelling, cyanosis and clubbing     Neurological Exam Mental status: Alert and oriented to time, place, person and situation.  Speech: Fluent, with normal naming, comprehension, articulation and repetition  CN II-XII: Visual fields intact to confrontation, PERRL (4->2), EOMI, facial sensation intact. Symmetrical facial movement. Hearing grossly intact. Strong cough, elevates palate, uvula midline. Strong shoulder shrug. Tongue midline.  Motor: (R/L) b/l UE/LE 5/5 except for left lower extremity weakness HF 4/5, KF 4/5, KE 4+/5, DF/PF 2/5 Normal tone and bulk. No abnormal movement, fasciculation or pronator drift.  Sensory: Intact light touch, pin prick, proprioception, and vibration. Without sensory level.   Reflexes: (R/L) 2/2Bj, 2/2Trj, 2/2Brj, 2/2Knj, 2/2Aj. No clonus, hofmann, cross adductor. Babinski negative.  Coordination/ fine movement: Normal finger to nose, heel to shin, finger tapping, foot tapping.  Gait: Normal stance and steady gait. Patient able to walk on heels, toes and in tandem. Romberg sign absent.  Assessment and Plan:  Adelfa Lozito 43 y.o. female with history of DM, HLD, HTN, CKD s/p transplant and migraine who presented to neurology for evaluation.      1. Chronic migraine without aura without status migrainosus, not intractable      Tried/failed/contraindicated medications  - Patient on coreg    - Topiramate is not a good choice due to kidney transplant    RECOMMENDATIONS  - Advised adequate hydration to avoid dehydration related headache.   - Start amitriptyline  25mg  at night for migraine prevention.   - If this is not helping consider botox injection.   - For severe migraine start ubrelvy  50mg . Dose could be repeated in 2 hours of no relief. Max of 2 tables per day. Total of 9 tablets per months.  - Ordered MRI brain with and without contrast for further evaluation.      Orders Placed This Encounter    MRI HEAD WO/W CONTRAST    amitriptyline  (ELAVIL ) 25 mg tablet    ubrogepant  (UBRELVY ) 50 mg tablet       FOLLOWUP PLAN  Return in about 1 year (around 02/18/2025).  The patient is instructed to contact me if there are any concerns with the agreed plan.  Problem   Chronic Migraine Without Aura Without Status Migrainosus, Not Intractable      Total time 60 minutes.  Estimated counseling time was more than 50% of the visit time. Counseled patient regarding chronic migraine without aura.        Anvita Hirata, MD  Clinical Assistant Professor   [employer]            [1]   Allergies  Allergen Reactions    Ketorolac RASH    Latex RASH    Penicillin RASH    Sulfa (Sulfonamide Antibiotics) EDEMA, ITCHING and RASH     Full body swelling; eye swelling; itchiness    Tromethamine SEE COMMENTS     unknown    Other reaction(s): Other (See Comments)   unknown

## 2024-02-19 NOTE — Patient Instructions
 Brandy Joseph 43 y.o. female with history of DM, HLD, HTN, CKD s/p transplant and migraine who presented to neurology for evaluation.      1. Chronic migraine without aura without status migrainosus, not intractable      Tried/failed/contraindicated medications  - Patient on coreg    - Topiramate is not a good choice due to kidney transplant    RECOMMENDATIONS  - Advised adequate hydration to avoid dehydration related headache.   - Start amitriptyline  25mg  at night for migraine prevention.   - If this is not helping consider botox injection.   - For severe migraine start ubrelvy  50mg . Dose could be repeated in 2 hours of no relief. Max of 2 tables per day. Total of 9 tablets per months.  - Ordered MRI brain with and without contrast for further evaluation.      Orders Placed This Encounter    MRI HEAD WO/W CONTRAST    amitriptyline  (ELAVIL ) 25 mg tablet    ubrogepant  (UBRELVY ) 50 mg tablet       FOLLOWUP PLAN  Return in about 1 year (around 02/18/2025).  The patient is instructed to contact me if there are any concerns with the agreed plan.      -- Preferred method of communication is through [employer], if the issue cannot wait until your next scheduled follow up.   -- MyChart may be used for non-emergent communication. Emails are not reviewed after hours or over the weekend/holidays/after 4PM. Staff will reply to your email within 24-48 business hours.       -- If you do not hear from us  within one week of a lab or imaging study being completed, please call/send my chart email to the office to be sure that we have received the results. This is especially challenging when tests are done outside of the [facility] system, as many times results do not make it back to our office for a variety of reasons. In our office no news is good news does not apply. You should hear from us  with results for each test.    -- If you are having acute (new/sudden onset) or severe/worsening neurologic symptoms, please call 911 or seek care in ED.    -- For scheduling of IMAGING/RADIOLOGY, please call [phone] at your convenience to schedule your studies.  -- For referrals placed during the visit, if you have not heard from scheduling within one week, please call the call center at [phone] to get scheduling assistance.  -- For refills on medications, please first contact your pharmacy, who will fax a refill authorization request form to our office.  Weekdays only. Allow up to 2 business days for refills. Please plan ahead, as refills will not be filled after hours.  -- Our front desk staff, Luke or Rosaline may be reached at [phone] for scheduling needs.   -- Heather RN, may be contacted at [phone] for urgent needs. Staff will return your call within 24 business hours.     For Appointments:   -- Please try to arrive early for your appointment time to help facilitate your visit. 15 minutes early is recommended.   -- If you are late to your appointment, we reserve the right to ask you to reschedule or wait until next available time to be seen in fairness to other patients scheduled that day.   -- There are times when we are running behind in clinic. Our goal is to always be on time, however, there are time when unexpected events  occur with patients, which may cause a delay. We appreciate your understanding when this occurs.

## 2024-02-20 DIAGNOSIS — G8929 Other chronic pain: Secondary | ICD-10-CM

## 2024-02-20 DIAGNOSIS — R519 Headache, unspecified: Principal | ICD-10-CM

## 2024-02-20 DIAGNOSIS — G43709 Chronic migraine without aura, not intractable, without status migrainosus: Secondary | ICD-10-CM

## 2024-02-21 ENCOUNTER — Encounter: Admit: 2024-02-21 | Discharge: 2024-02-21 | Payer: MEDICARE

## 2024-02-21 NOTE — Telephone Encounter
 Received PA request for Ubrelvy . Completed on covermymeds and attached clinicals. Your information has been sent to [facility]. PA Case ID #: [id number]

## 2024-02-21 NOTE — Telephone Encounter
Received PA approval for Ubrelvy. Notified pharmacy.

## 2024-02-22 ENCOUNTER — Encounter: Admit: 2024-02-22 | Discharge: 2024-02-22 | Payer: MEDICARE

## 2024-02-24 ENCOUNTER — Encounter: Admit: 2024-02-24 | Discharge: 2024-02-24 | Payer: MEDICARE

## 2024-02-24 DIAGNOSIS — D849 Immunodeficiency, unspecified: Principal | ICD-10-CM

## 2024-02-24 DIAGNOSIS — Z940 Kidney transplant status: Secondary | ICD-10-CM

## 2024-02-24 DIAGNOSIS — N183 Stage 3 chronic kidney disease, unspecified whether stage 3a or 3b CKD (CMS-HCC): Secondary | ICD-10-CM

## 2024-02-25 ENCOUNTER — Ambulatory Visit: Admit: 2024-02-25 | Discharge: 2024-02-26 | Payer: MEDICARE

## 2024-02-25 ENCOUNTER — Encounter: Admit: 2024-02-25 | Discharge: 2024-02-25 | Payer: MEDICARE

## 2024-02-28 ENCOUNTER — Encounter: Admit: 2024-02-28 | Discharge: 2024-02-28 | Payer: MEDICARE

## 2024-02-29 ENCOUNTER — Encounter: Admit: 2024-02-29 | Discharge: 2024-02-29 | Payer: MEDICARE

## 2024-02-29 DIAGNOSIS — Z940 Kidney transplant status: Principal | ICD-10-CM

## 2024-03-08 ENCOUNTER — Encounter: Admit: 2024-03-08 | Discharge: 2024-03-08 | Payer: MEDICARE

## 2024-03-08 DIAGNOSIS — Z940 Kidney transplant status: Principal | ICD-10-CM

## 2024-03-08 MED ORDER — MYCOPHENOLATE SODIUM 180 MG PO TBEC
180 mg | ORAL_TABLET | Freq: Two times a day (BID) | ORAL | 3 refills | 30.00000 days | Status: AC
Start: 2024-03-08 — End: ?

## 2024-03-08 MED ORDER — BIOTIN 5 MG PO CAP
1 | ORAL_CAPSULE | Freq: Every day | ORAL | 0 refills | 30.00000 days | Status: AC
Start: 2024-03-08 — End: ?

## 2024-03-08 MED ORDER — CHOLECALCIFEROL (VITAMIN D3) 50 MCG (2,000 UNIT) PO TAB
2000 [IU] | ORAL_TABLET | Freq: Every day | ORAL | 2 refills | 84.00000 days | Status: AC
Start: 2024-03-08 — End: ?

## 2024-03-13 ENCOUNTER — Encounter: Admit: 2024-03-13 | Discharge: 2024-03-13 | Payer: MEDICARE

## 2024-03-15 ENCOUNTER — Encounter: Admit: 2024-03-15 | Discharge: 2024-03-15 | Payer: MEDICARE

## 2024-03-15 NOTE — Progress Notes
 Med list emailed to patient as requested in [facility].

## 2024-03-23 ENCOUNTER — Encounter: Admit: 2024-03-23 | Discharge: 2024-03-23 | Payer: MEDICARE

## 2024-03-23 DIAGNOSIS — Z5181 Encounter for therapeutic drug level monitoring: Secondary | ICD-10-CM

## 2024-03-23 DIAGNOSIS — Z940 Kidney transplant status: Secondary | ICD-10-CM

## 2024-03-23 DIAGNOSIS — Z79899 Other long term (current) drug therapy: Secondary | ICD-10-CM

## 2024-03-23 DIAGNOSIS — R8279 Other abnormal findings on microbiological examination of urine: Secondary | ICD-10-CM

## 2024-03-23 DIAGNOSIS — R799 Abnormal finding of blood chemistry, unspecified: Secondary | ICD-10-CM

## 2024-03-23 DIAGNOSIS — E782 Mixed hyperlipidemia: Secondary | ICD-10-CM

## 2024-03-23 DIAGNOSIS — D849 Immunodeficiency, unspecified: Principal | ICD-10-CM

## 2024-03-23 DIAGNOSIS — Z6836 Body mass index (BMI) 36.0-36.9, adult: Secondary | ICD-10-CM

## 2024-03-23 LAB — CBC AND DIFF
HEMATOCRIT: 38 % (ref 34.1–44.9)
HEMOGLOBIN: 12 g/dL (ref 11.2–15.7)
LYMPHOCYTES: 29 % (ref 19.3–53.1)
MCH: 29 pg (ref 25.6–32.2)
MCHC: 31 g/dL — ABNORMAL LOW (ref 32.2–35.5)
MCV: 94 fL (ref 79.4–94.8)
MPV: 10 fL (ref 9.4–12.4)
NEUTROPHILS %: 58 % (ref 34.0–71.1)
PLATELET COUNT: 271 x10-3/uL (ref 182–369)
RBC COUNT: 4.1 x10-6/uL (ref 3.93–5.22)
RDW: 13
WBC COUNT: 6.6 X10-3/uL (ref 3.98–10.04)

## 2024-03-23 LAB — COMPREHENSIVE METABOLIC PANEL
ALBUMIN: 3.9 g/dL (ref 3.5–5)
ALK PHOSPHATASE: 56
ALT: 15 U/L (ref <34)
ANION GAP: 9 meq/L (ref 8–16)
AST: 18 U/L (ref 11–34)
BLD UREA NITROGEN: 29 mg/dL — ABNORMAL HIGH (ref 7–18.7)
CALCIUM: 9.9 mg/dL (ref 8.4–10.2)
CHLORIDE: 104 mmol/L (ref 88–107)
CO2: 25 mmol/L (ref 22–29)
CREATININE: 1.5 mg/dL — ABNORMAL HIGH (ref 0.50–1.10)
EGFR: 41 — ABNORMAL LOW (ref >59)
GLUCOSE,RANDOM: 84
SODIUM: 138 mmol/L (ref 136–145)
TOTAL BILIRUBIN: 0.3 mg/dL (ref 0.2–1.2)
TOTAL PROTEIN: 7.4 g/dL (ref 6.4–8.3)

## 2024-03-23 LAB — PROTEIN/CR RATIO,UR RAN: UR CREATININE, RAN: 103 mg/dL (ref 15.00–278.00)

## 2024-03-23 LAB — MAGNESIUM: MAGNESIUM: 1.9 mg/dL (ref 1.6–2.6)

## 2024-03-23 LAB — URIC ACID: URIC ACID: 6.2 (ref 2.5–9.2)

## 2024-03-23 LAB — PHOSPHORUS: PHOSPHORUS: 3.7 (ref 2.5–4)

## 2024-03-27 ENCOUNTER — Encounter: Admit: 2024-03-27 | Discharge: 2024-03-27 | Payer: MEDICARE

## 2024-03-28 ENCOUNTER — Encounter: Admit: 2024-03-28 | Discharge: 2024-03-28 | Payer: MEDICARE

## 2024-03-30 ENCOUNTER — Encounter: Admit: 2024-03-30 | Discharge: 2024-03-30 | Payer: MEDICARE

## 2024-03-31 ENCOUNTER — Encounter: Admit: 2024-03-31 | Discharge: 2024-03-31 | Payer: MEDICARE

## 2024-04-09 ENCOUNTER — Encounter: Admit: 2024-04-09 | Discharge: 2024-04-09 | Payer: MEDICARE

## 2024-04-09 ENCOUNTER — Ambulatory Visit: Admit: 2024-04-09 | Discharge: 2024-04-09 | Payer: MEDICARE

## 2024-04-13 ENCOUNTER — Encounter: Admit: 2024-04-13 | Discharge: 2024-04-13 | Payer: MEDICARE

## 2024-04-24 ENCOUNTER — Encounter: Admit: 2024-04-24 | Discharge: 2024-04-24 | Payer: MEDICARE

## 2024-04-24 DIAGNOSIS — Z79899 Other long term (current) drug therapy: Secondary | ICD-10-CM

## 2024-04-24 DIAGNOSIS — D849 Immunodeficiency, unspecified: Secondary | ICD-10-CM

## 2024-04-24 DIAGNOSIS — Z940 Kidney transplant status: Principal | ICD-10-CM

## 2024-04-24 NOTE — Progress Notes
 IS: labs every 25mo  MPA 180 mg bid  Prednisone  5 mg daily  Prograf  1 mg AM, 0.5 mg PM    04/21/23 DSA  Beads I Positive: No donor specific antibody is present   Beads II Negative: No donor specific antibody is present       08/19/22 DSA  Beads I Positive: No donor specific antibody is present   Beads II Negative: No donor specific antibody is present     Health Maintenance:  Pap smear: UTD 2025  Mammogram: This Friday   Colonoscopy: UTD 05/15/2020  Dermatology: Seeing annually, no skin cancers to date    Visit:   Left before flu shot today- send [facility] to get locally or reschedule [place]    Labs today with DSA/AS and upkeep    Karnofsky score: disability, complete

## 2024-04-25 ENCOUNTER — Encounter: Admit: 2024-04-25 | Discharge: 2024-04-25 | Payer: MEDICARE

## 2024-04-25 ENCOUNTER — Ambulatory Visit: Admit: 2024-04-25 | Discharge: 2024-04-25 | Payer: MEDICARE

## 2024-04-25 DIAGNOSIS — D849 Immunodeficiency, unspecified: Principal | ICD-10-CM

## 2024-04-25 DIAGNOSIS — Z940 Kidney transplant status: Principal | ICD-10-CM

## 2024-04-25 NOTE — Progress Notes
 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  Donor: DCD, IRD: Yes, Age: 57s, female, Ht 5'11. Wt 215 lbs. Creatinine Initial/Terminal. ?/1.8  KDPI: 47%, cPRA:  75%  Match: VXM Negative, PXM -ve  CMV: D+/R+  Induction: thymoglobulin   Maintenance Immunosuppression: Triple drug therapy  Post OP: good UOP post op    Referring Nephrologist:  Debby Oddi  [address] [place] [address]  Phone: [phone]  Fax: [phone]     Dear Dr. Debby Oddi,    Brandy Joseph is a 43 year old female with type 2 diabetes and hypertension who presents for a nephrology follow-up.    She has experienced a decrease in her A1c to 6.6% as of September. She is currently on Mounjaro for diabetes management, which has also contributed to a weight loss of 22 pounds, reducing her weight from 234 pounds to 212 pounds. She previously tried Ozempic but discontinued it due to adverse effects, which led to some weight gain before starting Mounjaro. She has experienced episodes of hypoglycemia, with a recent low blood sugar reading of 50 mg/dL, and is closely monitoring her blood sugar levels.    Her blood pressure has been generally stable, with occasional slightly elevated readings at home. She monitors her blood pressure regularly.    She has recently started taking famotidine  20 mg once nightly for acid reflux, which has alleviated her symptoms of waking up choking on acid. She was previously on pantoprazole , and famotidine  is an addition to her regimen.    She continues to undergo monthly lab work to monitor her kidney function and other parameters. No recent illnesses, surgeries, or emergency room visits. No falls or issues with mobility. Stable with the use of a cane.    REVIEW OF SYSTEMS: Comprehensive 14-point ROS reviewed  Positives noted in HPI otherwise negative.    Past History:  Past Medical History:    Arthritis    Back pain    BK viremia    Cataract    Complication of anesthesia    Diabetes mellitus type 1 (CMS-HCC)    DM (diabetes mellitus) (CMS-HCC)    Gastroparesis    Hyperlipemia    Hypertension    Kidney disease    Memory loss    Migraines    Retinal detachment    Seasonal allergic reaction    Seizures (CMS-HCC)    Stroke (CMS-HCC)       Surgical History:   Procedure Laterality Date    ANKLE SURGERY Left 1996    FIBULA FRACTURE SURGERY Left 1996    ESOPHAGOGASTRODUODENOSCOPY WITH BIOPSY - FLEXIBLE N/A 03/29/2018    Performed by Priscella Squires, MD at [facility] ENDO    ESOPHAGOGASTRODUODENOSCOPY WITH BIOPSY - FLEXIBLE N/A 10/04/2019    Performed by Malissa Norleen LABOR, MD at [facility] ENDO    COLONOSCOPY WITH BIOPSY - FLEXIBLE N/A 10/04/2019    Performed by Malissa Norleen LABOR, MD at [facility] ENDO    ANGIOGRAPHY CORONARY ARTERY WITH LEFT HEART CATHETERIZATION N/A 01/18/2020    Performed by Erling Camellia RAMAN, MD at [facility] CATH LAB    POSSIBLE PERCUTANEOUS CORONARY STENT PLACEMENT WITH ANGIOPLASTY N/A 01/18/2020    Performed by Erling Camellia RAMAN, MD at [facility] CATH LAB    COLONOSCOPY WITH BIOPSY -  FLEXIBLE N/A 05/15/2020    Performed by Noralyn Blossom, MD at [facility] ENDO    COLONOSCOPY WITH SNARE REMOVAL TUMOR/ POLYP/ OTHER LESION  05/15/2020    Performed by Noralyn Blossom, MD at [facility] ENDO    ALLOTRANSPLANTATION KIDNEY FROM NON LIVING DONOR WITHOUT RECIPIENT NEPHRECTOMY N/A 05/07/2021    Performed by Terrie Sieving, MD at [facility] OR    BURR HOLE      release pressure from stroke    DIALYSIS FISTULA CREATION      GALLBLADDER SURGERY      HX CESAREAN SECTION      HX EYE SURGERY  03/2020    Retina bleed    HX RETINAL LASER  Unknown    KIDNEY SURGERY      KIDNEY TRANSPLANT      RETINAL DETACHMENT REPAIR  Unknown    SINUS SURGERY  Unknown    Had nodules removed       Social History     Socioeconomic History    Marital status: Divorced    Number of children: 1    Highest education level: Associate degree: occupational, Scientist, product/process development, or vocational program   Tobacco Use    Smoking status: Never    Smokeless tobacco: Never    Tobacco comments:     Several people in my famiky smoke, so ive been exposed   Vaping Use    Vaping status: Never Used   Substance and Sexual Activity    Alcohol use: Yes     Alcohol/week: 3.0 standard drinks of alcohol     Types: 3 Glasses of wine per week     Comment: occasional glass of wine    Drug use: Never    Sexual activity: Yes     Partners: Male     Birth control/protection: Surgical       Family History   Problem Relation Name Age of Onset    Stroke Mother Mom         Knee surgery led to stroke    Diabetes Paternal Grandmother Cy Rattler     Heart problem Paternal Grandmother Cy Rattler     Hypertension Paternal Grandmother Cy Rattler     Tumor Maternal Grandmother Meade Pinal         brain    Cancer Maternal Grandmother Meade Pinal         Brain tumor    None Reported Son      Diabetes Grandparent Cy Rattler     Cancer Grandparent Meade Pinal         Stage 4 Brain tumor    Cancer Grandparent Nada Rattler         Alzheimers    Blood Clots Mother Dorthea Rattler         She died of this    COPD Mother Dorthea Rattler     Blood Clots Paternal Grandmother Cy Rattler         Diabetes    Blood Clots Paternal Grandfather Nada Rattler         Hypoglycemia       Allergies   Allergen Reactions    Ketorolac RASH    Latex RASH    Penicillin RASH    Sulfa (Sulfonamide Antibiotics) EDEMA, ITCHING and RASH     Full body swelling; eye swelling; itchiness    Tromethamine SEE COMMENTS     unknown    Other reaction(s): Other (See Comments)   unknown       Current Medications:  Current Outpatient Medications:     ACCU-CHEK GUIDE TEST STRIPS test strip, USE TO TEST SUGARS SIX TIMES DAILY, Disp: 200 each, Rfl: 0    acetaminophen  (TYLENOL  PO), Take  by mouth every 6 hours as needed (headache, pain)., Disp: , Rfl:     amitriptyline  (ELAVIL ) 25 mg tablet, Take one tablet by mouth daily., Disp: 90 tablet, Rfl: 3    amLODIPine  (NORVASC ) 10 mg tablet, Take one tablet by mouth daily., Disp: , Rfl:     aspirin  EC (ASPIR-LOW) 81 mg tablet, Take one tablet by mouth daily. Take with food., Disp: 90 tablet, Rfl:     Biotin  5 mg cap, TAKE 1 CAPSULE BY MOUTH EVERY DAY, Disp: 90 capsule, Rfl: 0    buPROPion  XL (WELLBUTRIN  XL) 150 mg tablet, Take one tablet by mouth every morning. Do not crush or chew., Disp: , Rfl:     carvediloL  (COREG ) 25 mg tablet, TAKE 1 AND 1/2 TABLETS BY MOUTH TWICE DAILY WITH FOOD, Disp: 270 tablet, Rfl: 3    cetirizine  (ZYRTEC ) 10 mg tablet, Take one tablet by mouth daily., Disp: , Rfl:     CHOLEcalciferoL  (vitamin D3) 50 mcg (2,000 unit) tablet, TAKE 1 TABLET BY MOUTH EVERY DAY, Disp: 90 tablet, Rfl: 2    cloNIDine  HCL (CATAPRES ) 0.2 mg tablet, Take one tablet by mouth twice daily., Disp: , Rfl:     famotidine  (PEPCID ) 20 mg tablet, Take one tablet by mouth at bedtime daily., Disp: , Rfl:     ferrous sulfate  (FEROSUL) 325 mg (65 mg iron ) tablet, TAKE 1 TABLET BY MOUTH EVERY DAY, Disp: 90 tablet, Rfl: 3    furosemide  (LASIX  PO), Lasix , Disp: , Rfl:     insulin  lispro (U-100) (HUMALOG  KWIKPEN INSULIN ) 100 unit/mL subcutaneous PEN, USE WITH INSULIN  PUMP MAX DAILY DOSE OF 70 UNITS **TARGET RANGE 110-130**, Disp: 45 mL, Rfl: 3    Insulin  Syringe-Needle U-100 (BD INSULIN  SYRINGE ULTRA-FINE) 1 mL 31 gauge x 5/16 syrg, Use with insulin  vials as directed, Disp: 100 each, Rfl: 1    lidocaine  (LIDODERM ) 5 % topical patch, Apply one patch topically to affected area every 24 hours as needed for Pain., Disp: , Rfl:     lisinopril  (ZESTRIL ) 5 mg tablet, Take one tablet by mouth at bedtime daily. Indications: high blood pressure, Disp: 30 tablet, Rfl: 2    magnesium  oxide (MAGOX) 400 mg (241.3 mg magnesium ) tablet, TAKE 2 TABLETS BY MOUTH DAILY, Disp: 180 tablet, Rfl: 3    MOUNJARO 5 mg/0.5 mL injector PEN, INJECT 5MG  SUBCUTANEOUSLY EVERY WEEK, Disp: 2 mL, Rfl: 0 mycophenolate  sodium (MYFORTIC ) 180 mg tablet,delayed release, TAKE 1 TABLET BY MOUTH TWICE DAILY, Disp: 180 tablet, Rfl: 3    NIFEdipine SR (ADALAT CC) 60 mg tablet, Take one tablet by mouth daily., Disp: , Rfl:     pantoprazole  DR (PROTONIX ) 40 mg tablet, Take one tablet by mouth daily., Disp: 30 tablet, Rfl: 1    predniSONE  (DELTASONE ) 5 mg tablet, TAKE 1 TABLET BY MOUTH EVERY DAY, Disp: 90 tablet, Rfl: 3    rosuvastatin  (CRESTOR ) 20 mg tablet, Take one-half tablet by mouth daily., Disp: 15 tablet, Rfl: 1    tacrolimus  (PROGRAF ) 0.5 mg capsule, Take one capsule by mouth every evening. Indications: prevent kidney transplant rejection, Z94.0, Disp: 90 capsule, Rfl: 3    tacrolimus  (PROGRAF ) 1 mg capsule, Take one capsule by mouth every morning. Indications: prevent kidney transplant rejection, Z94.0, Disp: 90 capsule, Rfl: 3    tamsulosin  (FLOMAX ) 0.4  mg capsule, Take one capsule by mouth daily after breakfast. Do not crush, chew or open capsules. Take 30 minutes following the same meal each day., Disp: 30 capsule, Rfl: 1    ubrogepant  (UBRELVY ) 50 mg tablet, Take one tablet by mouth daily as needed. May repeat once after 2 hours based on response., Disp: 10 tablet, Rfl: 3    Current Facility-Administered Medications:     dextrose  (glucose) (GLUTOSE) oral gel 15.2 g, 15.2 g, Oral, PRN, Al-Mubaslat, Dominique SAILOR, MD    Physical Exam:  Vitals:    04/25/24 1044 04/25/24 1045   BP: (!) 140/75 125/70   BP Source: Arm, Right Lower Arm, Right Lower   Pulse: 79 83   Temp: 36.5 ?C (97.7 ?F)    SpO2: 100%    TempSrc: Oral    PainSc: Zero    Weight: 96.4 kg (212 lb 9.6 oz)    Height: 167.6 cm (5' 6)      Body mass index is 34.31 kg/m?SABRA  General: NAD, A+Ox4, calm and pleasant. Appears to be stated age. Walker  HENT: Unremarkable, no oral lesions  Neck: Normal ROM, no LAD, no JVD  Lungs: Bilat. CTA  CV: RRR, S1, S2 without carotid bruit or murmur, no edema  Abdomen: Soft, N/D, N/T without hepatosplenomegaly  Incision: healing  M/S: Normal ROM and strength  Neuro: Nonfocal deficits without tremors  Skin: No skin rash  Psyc: Stable  Dialysis Access: LUE AVF/AVG good thrill/bruit  Has gained significant weight     Laboratory studies:     CMP:      Latest Ref Rng & Units 04/25/2024    10:29 AM 03/22/2024    10:20 AM 02/23/2024    10:23 AM 01/26/2024    10:48 AM 12/29/2023    10:45 AM   CMP   Sodium 137 - 147 mmol/L 137  138  141  140  141    Potassium 3.5 - 5.1 mmol/L 3.8  4.3  4.0  4.2  4.1    Chloride 98 - 110 mmol/L 103  104  105  105  105    CO2 21 - 30 mmol/L 25  25.0  23.0  25.0  26.0    Anion Gap 3 - 12 9  9  13  10  10     Blood Urea Nitrogen 7 - 25 mg/dL 23  70.0  77.9  81.4  80.6    Creatinine 0.40 - 1.00 mg/dL 8.69  8.43  8.55  8.65  1.49    Glucose 70 - 100 mg/dL 83  84  87  68  91    Calcium  8.5 - 10.6 mg/dL 9.9  9.9  9.2  9.6  9.4    Total Protein 6.0 - 8.0 g/dL 7.6  7.4  7.3  7.3  7.0    Albumin  3.5 - 5.0 g/dL 4.2  3.9  3.9  3.7  3.9    Alk Phosphatase 25 - 110 U/L 59  56  55  50  52    ALT (SGPT) 7 - 56 U/L 11  15  16  16  15     AST 7 - 40 U/L 14  18  16  16  15     Total Bilirubin 0.2 - 1.3 mg/dL 0.4  9.62  9.38  9.62  0.52    GFR >60 mL/min 52          Hemoglobin A1C   Date Value   03/22/2024 6.6 %  07/29/2023 6.9   04/13/2023 7.3 % (H)     PTH   Date Value   03/22/2024 50.1 mL   02/23/2024 51.0 pg/mL     Lab Results   Component Value Date    LIPASE 5 (L) 05/16/2021    LIPASE 31 10/01/2019     No results found for: AMY  BK Virus Plasma Quant   Date Value   03/22/2024 Not Detected   02/23/2024 Not Detected   01/26/2024 Not Detected IU/mL   12/29/2023 Not Detected IU/mL   11/25/2023 Not Detected IU/mL     CMV DNA Quant PCR   Date Value   03/22/2024 Not Detected IU/mL   02/23/2024 Not Detected IU/mL   01/26/2024 Not Detected   11/25/2023 Not Detected   10/08/2023 Not Detected     IU/mL CMV Blood (no units)   Date Value   10/29/2021     <50 IU/mL  The test method detects and quantitates CMV DNA using the Abbott RealTime assay,   and is approved by the FDA for monitoring hematopoietic stem cell transplant   patients who are undergoing anti-CMV therapy.  Please correlate results with the   clinical status of the patient.     10/01/2021     <50 IU/mL  The test method detects and quantitates CMV DNA using the Abbott RealTime assay,   and is approved by the FDA for monitoring hematopoietic stem cell transplant   patients who are undergoing anti-CMV therapy.  Please correlate results with the   clinical status of the patient.     08/05/2021     <50 IU/mL  The test method detects and quantitates CMV DNA using the Abbott RealTime assay,   and is approved by the FDA for monitoring hematopoietic stem cell transplant   patients who are undergoing anti-CMV therapy.  Please correlate results with the   clinical status of the patient.     07/30/2021     <50 IU/mL  The test method detects and quantitates CMV DNA using the Abbott RealTime assay,   and is approved by the FDA for monitoring hematopoietic stem cell transplant   patients who are undergoing anti-CMV therapy.  Please correlate results with the   clinical status of the patient.     07/09/2021     <50 IU/mL  The test method detects and quantitates CMV DNA using the Abbott RealTime assay,   and is approved by the FDA for monitoring hematopoietic stem cell transplant   patients who are undergoing anti-CMV therapy.  Please correlate results with the   clinical status of the patient.       BK Virus Plasma Copies/ML (no units)   Date Value   03/22/2023 Not Detected   02/17/2023 Not Detected   11/25/2022 NOT DETECTED   11/04/2022 Not Detected   08/19/2022 Not Detected     No results found for: EBVDNAQT    TACROLIMUS  LEVEL:  Tacrolimus  Immunoassay (ng/mL)   Date Value   04/13/2023 6.0   04/08/2022 9.7   10/29/2021 12.8   10/01/2021 11.2   08/06/2021 14.2   07/30/2021 8.4       CBC with Diff:      Latest Ref Rng & Units 03/22/2024    10:20 AM 02/23/2024    10:23 AM   CBC with Diff   WBC 3.98 - 10.04 X10-3/uL 6.66  6.57    RBC 3.93 - 5.22 x10-6/uL 4.14  3.96    Hemoglobin 11.2 - 15.7 g/dL 12.1  11.5    Hematocrit 34.1 - 44.9 % 38.9  37.4    MCV 79.4 - 94.8 fL 94.0  94.4    MCH 25.6 - 32.2 pg 29.2  29.0    MCHC 32.2 - 35.5 g/dL 68.8  69.2    RDW  86.7  13.7    Platelet Count 182 - 369 x10-3/uL 271  269    MPV 9.4 - 12.4 fL 10.0  10.2    Neurtrophils 34.0 - 71.1 % 58.7  54.4    Absolute Neutrophils 1.56 - 6.13 x10-3/uL 3.92  3.58    Lymphocytes 19.3 - 53.1 % 29.9  33.8    Absolute Lymph Count 1.18 - 3.74 x10-3/uL 1.99  2.22    Monocytes 4.7 - 12.5 % 8.3  9.6    Absolute Monocyte Count 0.24 - 0.86 x10-3/uL 0.55  0.63    Absolute Eosinophil Count 0.04 - 0.54 x10-3/uL 0.15  0.09      Lab Results   Component Value Date/Time    IRON  47 (L) 04/13/2023 10:40 AM    TIBC 334 04/13/2023 10:40 AM    PSAT 14 (L) 04/13/2023 10:40 AM    FERRITIN 41 04/13/2023 10:40 AM    FERRITIN 123 04/08/2022 10:49 AM       Urinalysis:  Lab Results   Component Value Date/Time    UCOLOR Yellow 03/22/2024 10:20 AM    TURBID Clear 03/22/2024 10:20 AM    USPGR 1.010 03/22/2024 10:20 AM    UPH 6.0 03/22/2024 10:20 AM    UPROTEIN Negative 03/22/2024 10:20 AM    UAGLU Negative 03/22/2024 10:20 AM    UKET Negative 03/22/2024 10:20 AM    UBILE Negative 03/22/2024 10:20 AM    UBLD Negative 03/22/2024 10:20 AM    UROB 0.2 03/22/2024 10:20 AM     Protein/CR ratio   Date Value   03/22/2024 <0.2 ML   02/23/2024 <0.2 ML   01/26/2024 <0.2   12/29/2023 0.1   11/25/2023 0.1       Imaging:  Results for orders placed during the hospital encounter of 08/05/21    CTA CHEST PULM EMBOLISM W/CONT    Impression  1.  Limited opacification of subsegmental pulmonary arteries without imaging features suggestive of acute pulmonary embolism. No evidence of right heart strain.  2.  Several unchanged scattered bilateral pulmonary nodules, likely scarring/granulomas. Dedicated follow-up is not indicated in the absence of known malignancy.  3.  Nonobstructing left upper pole renal calculus.    By my electronic signature, I attest that I have personally reviewed the images for this examination and formulated the interpretations and opinions expressed in this report      Finalized by Mabel Dunks, M.D. on 08/05/2021 8:41 PM. Dictated by Lolita Goldmann, M.D. on 08/05/2021 7:58 PM.    Results for orders placed during the hospital encounter of 08/05/21    ABDOMEN 1 VIEW    Impression  Findings/Impression:    2 supine frontal x-ray images of the abdomen/pelvis were obtained. No dilated bowel loops are visualized. Prior cholecystectomy. Mass-effect in the right pelvis region is present due to the known right pelvis renal transplant. There are several tiny foci of calcification projected over the pelvis that most likely represent phleboliths. Calcific arteriosclerosis.      Finalized by Bernardino Overman, M.D. on 08/05/2021 10:44 AM. Dictated by Bernardino Overman, M.D. on 08/05/2021 10:42 AM.    Assessment and Plan:  Brandy Joseph is a 43 y.o. Caucasian female with PMH of ESRD  2/2 DM1 s/p a DDRT on 05/07/2021.      #) Immunosuppression - On maintenance Triple drug therapy  - Prograf  to goal 5-10 by MEIA, MPA 180mg  BID  - 2/2 CMV and previous BK   - and steroid pulse to wean to prednisone  5 mg daily     04/21/23 DSA  Beads I Positive: No donor specific antibody is present   Beads II Negative: No donor specific antibody is present       08/19/22 DSA  Beads I Positive: No donor specific antibody is present   Beads II Negative: No donor specific antibody is present     Chronic kidney disease, unspecified stage, DDRT  Chronic kidney disease is well-managed with creatinine levels within normal range. Previous abnormalities were likely due to dehydration.  - Ensure adequate hydration  - If creatinine is high, will make a plan accordingly    Type 2 diabetes mellitus  Weight loss of 22 pounds since last visit, likely aided by [facility], which also benefits kidney and heart health.  - Continue Mounjaro for diabetes management and weight loss  - Continue monthly lab monitoring    Hypertension  Well-controlled with occasional slightly elevated readings at home. Current blood pressure is satisfactory.  - Monitor blood pressure at home  - Report if blood pressure is consistently above 130/90 mmHg for a week    Obesity  Managed with weight loss of 22 pounds since last visit, aided by Mounjaro.  - Continue Mounjaro for weight management    Gastroesophageal reflux disease  Managed with famotidine , effectively controlling symptoms of acid reflux.  - Continue famotidine  20 mg once daily    Difficulty with mobility, uses cane  Mobility is stable with cane use. No recent falls reported.  - Continue using cane for mobility support       #) ID Proph  - CMV Donor(+)/ Recipient(+)s/p Valcyte  for 3 months post transplant,   - Intermittent CMV - on lowered MPA   - Keflex  for urinary prophylaxis until stent, pentamidine  for PJP.   - the patient has Hx of recurrent UTI and Hx of neurogenic bladder.     #) Hx of CVA   - with residual memory loss and RLE weakness.  -ASA/ cont Rosuvastatin      #) Anemia - Hgb average of 10-12s     #) CKD-MBD  - PTH 396 on 05/07/2021  - Vitamin D  16.5 on 11/2 - Completed 25-Vit.D 50 000 unit weekly. Start 2000 international unit(s) once daily  - Ca acceptable    #) Memory issues  - unable to remember meds, timings etc  - uses phone alarm    #) Anxiety/depression  - Cont Wellbutrin  XL    #) General health maint. Managed by PCP  Pap smear: UTD 2025  Mammogram: This Friday   Colonoscopy: UTD 05/15/2020  Dermatology: Seeing annually, no skin cancers to date    6 months with me   Labs monthly     Sincerely,    Aletha Bring, MD  Transplant Nephrology    Cc: Thomas Alderson  Cc: Norman LABOR Darland    Please contact the [facility] at [phone] for any transplant related questions or concerns that may arise.

## 2024-04-25 NOTE — Patient Instructions
 Return to see us  in 6 months, labs monthly

## 2024-04-25 NOTE — Telephone Encounter
 10/21 clinic follow up:  [facility] sent to patient to see if she prefers to come back for flu shot as NV or obtain locally  DSA was not drawn today as ordered- requested DSA kit be shipped to patient for local draw. Patient notified.

## 2024-04-28 ENCOUNTER — Encounter: Admit: 2024-04-28 | Discharge: 2024-04-28 | Payer: MEDICARE

## 2024-05-01 ENCOUNTER — Encounter: Admit: 2024-05-01 | Discharge: 2024-05-01 | Payer: MEDICARE

## 2024-05-16 ENCOUNTER — Encounter: Admit: 2024-05-16 | Discharge: 2024-05-16 | Payer: MEDICARE

## 2024-05-17 ENCOUNTER — Encounter: Admit: 2024-05-17 | Discharge: 2024-05-17 | Payer: MEDICARE

## 2024-05-18 ENCOUNTER — Encounter: Admit: 2024-05-18 | Discharge: 2024-05-18 | Payer: MEDICARE

## 2024-05-18 DIAGNOSIS — N186 End stage renal disease: Principal | ICD-10-CM

## 2024-05-18 DIAGNOSIS — Z940 Kidney transplant status: Secondary | ICD-10-CM

## 2024-05-19 ENCOUNTER — Encounter: Admit: 2024-05-19 | Discharge: 2024-05-19 | Payer: MEDICARE

## 2024-05-22 ENCOUNTER — Encounter: Admit: 2024-05-22 | Discharge: 2024-05-22 | Payer: MEDICARE

## 2024-05-22 DIAGNOSIS — Z79899 Other long term (current) drug therapy: Secondary | ICD-10-CM

## 2024-05-22 DIAGNOSIS — D849 Immunodeficiency, unspecified: Principal | ICD-10-CM

## 2024-05-22 DIAGNOSIS — Z940 Kidney transplant status: Secondary | ICD-10-CM

## 2024-05-24 ENCOUNTER — Encounter: Admit: 2024-05-24 | Discharge: 2024-05-24 | Payer: MEDICARE

## 2024-05-26 ENCOUNTER — Encounter: Admit: 2024-05-26 | Discharge: 2024-05-26 | Payer: MEDICARE

## 2024-06-02 ENCOUNTER — Encounter: Admit: 2024-06-02 | Discharge: 2024-06-02 | Payer: MEDICARE

## 2024-06-05 ENCOUNTER — Encounter: Admit: 2024-06-05 | Discharge: 2024-06-05 | Payer: MEDICARE

## 2024-06-08 ENCOUNTER — Encounter: Admit: 2024-06-08 | Discharge: 2024-06-08 | Payer: MEDICARE

## 2024-06-09 ENCOUNTER — Encounter: Admit: 2024-06-09 | Discharge: 2024-06-09 | Payer: MEDICARE

## 2024-06-11 ENCOUNTER — Encounter: Admit: 2024-06-11 | Discharge: 2024-06-11 | Payer: MEDICARE

## 2024-06-12 ENCOUNTER — Encounter: Admit: 2024-06-12 | Discharge: 2024-06-12 | Payer: MEDICARE

## 2024-06-15 ENCOUNTER — Encounter: Admit: 2024-06-15 | Discharge: 2024-06-15 | Payer: MEDICARE

## 2024-06-15 NOTE — Telephone Encounter [36]
 Received UA from [facility] showing 50,000-99,000 CRU/ml of Enterococcus faecalis. Called Pt to check if symptomatic. Left message to call us  back.

## 2024-06-22 ENCOUNTER — Encounter: Admit: 2024-06-22 | Discharge: 2024-06-22 | Payer: MEDICARE

## 2024-06-22 DIAGNOSIS — N186 End stage renal disease: Principal | ICD-10-CM

## 2024-06-22 DIAGNOSIS — Z940 Kidney transplant status: Secondary | ICD-10-CM

## 2024-06-22 DIAGNOSIS — E782 Mixed hyperlipidemia: Secondary | ICD-10-CM

## 2024-06-26 ENCOUNTER — Encounter: Admit: 2024-06-26 | Discharge: 2024-06-26 | Payer: MEDICARE

## 2024-07-02 ENCOUNTER — Encounter: Admit: 2024-07-02 | Discharge: 2024-07-02 | Payer: MEDICARE

## 2024-07-02 NOTE — Telephone Encounter [36]
 Received on-call page from pt verbalizing she had taken PM meds by accident during in AM.  Med list reviewed w/pt to verify meds taken.  TC discussed pt situation w/on-call Neph:  Dr. [place], he recommended pt monitor BP, repeat labs on Wed and take AM meds this evening. Patient called and notified and v/u.     PT verbalized she had completed Routine Renal labs last week and questioned if labs were necessary this coming Wed as labs were schedule monthly. TC advised pt to contact [facility] to discuss lab draw. TE encounter routed to [facility] for further f/u.

## 2024-07-03 ENCOUNTER — Encounter: Admit: 2024-07-03 | Discharge: 2024-07-03 | Payer: MEDICARE

## 2024-07-03 DIAGNOSIS — Z940 Kidney transplant status: Principal | ICD-10-CM

## 2024-07-04 ENCOUNTER — Encounter: Admit: 2024-07-04 | Discharge: 2024-07-04 | Payer: MEDICARE

## 2024-07-05 ENCOUNTER — Encounter: Admit: 2024-07-05 | Discharge: 2024-07-05 | Payer: MEDICARE

## 2024-07-10 ENCOUNTER — Encounter: Admit: 2024-07-10 | Discharge: 2024-07-10 | Payer: MEDICARE

## 2024-07-21 ENCOUNTER — Encounter: Admit: 2024-07-21 | Discharge: 2024-07-21 | Payer: MEDICARE

## 2024-07-21 DIAGNOSIS — N189 Chronic kidney disease, unspecified: Secondary | ICD-10-CM

## 2024-07-21 DIAGNOSIS — E782 Mixed hyperlipidemia: Principal | ICD-10-CM

## 2024-07-25 ENCOUNTER — Encounter: Admit: 2024-07-25 | Discharge: 2024-07-25 | Payer: MEDICARE

## 2024-07-26 ENCOUNTER — Encounter: Admit: 2024-07-26 | Discharge: 2024-07-26 | Payer: MEDICARE

## 2024-07-26 DIAGNOSIS — D849 Immunodeficiency, unspecified: Principal | ICD-10-CM

## 2024-07-26 DIAGNOSIS — Z940 Kidney transplant status: Secondary | ICD-10-CM

## 2024-07-26 DIAGNOSIS — Z79899 Other long term (current) drug therapy: Secondary | ICD-10-CM

## 2024-07-31 ENCOUNTER — Encounter: Admit: 2024-07-31 | Discharge: 2024-07-31 | Payer: MEDICARE

## 2024-08-03 ENCOUNTER — Encounter: Admit: 2024-08-03 | Discharge: 2024-08-03 | Payer: MEDICARE

## 2024-08-03 NOTE — Telephone Encounter [36]
 Divina called needing clarification of pt's diagnosis code. Please call her back.

## 2024-08-03 NOTE — Telephone Encounter [36]
 Returned call to Divina who is requesting icd-10 code for Vitamin D  lab that was drawn yesterday. Provided code N18.9, chronic kidney disease.

## 2024-08-06 ENCOUNTER — Encounter: Admit: 2024-08-06 | Discharge: 2024-08-06 | Payer: MEDICARE
